# Patient Record
Sex: Male | Born: 1955
Health system: Southern US, Community
[De-identification: ages and names within clinical notes are randomized; demographics above are authoritative.]

## PROBLEM LIST (undated history)

## (undated) DIAGNOSIS — F411 Generalized anxiety disorder: Secondary | ICD-10-CM

## (undated) DIAGNOSIS — Z9289 Personal history of other medical treatment: Secondary | ICD-10-CM

## (undated) DIAGNOSIS — N200 Calculus of kidney: Secondary | ICD-10-CM

## (undated) DIAGNOSIS — F419 Anxiety disorder, unspecified: Secondary | ICD-10-CM

## (undated) DIAGNOSIS — I10 Essential (primary) hypertension: Secondary | ICD-10-CM

## (undated) DIAGNOSIS — L989 Disorder of the skin and subcutaneous tissue, unspecified: Secondary | ICD-10-CM

## (undated) DIAGNOSIS — I639 Cerebral infarction, unspecified: Secondary | ICD-10-CM

## (undated) HISTORY — DX: Disorder of the skin and subcutaneous tissue, unspecified: L98.9

## (undated) HISTORY — DX: Calculus of kidney: N20.0

## (undated) HISTORY — DX: Generalized anxiety disorder: F41.1

## (undated) HISTORY — DX: Cerebral infarction, unspecified: I63.9

## (undated) HISTORY — DX: Essential (primary) hypertension: I10

## (undated) HISTORY — PX: KIDNEY STONE SURGERY: SHX686

## (undated) HISTORY — DX: Personal history of other medical treatment: Z92.89

---

## 2001-08-28 ENCOUNTER — Emergency Department (HOSPITAL_COMMUNITY): Admission: EM | Admit: 2001-08-28 | Discharge: 2001-08-28 | Payer: Self-pay | Admitting: Emergency Medicine

## 2001-08-28 ENCOUNTER — Encounter: Payer: Self-pay | Admitting: Emergency Medicine

## 2005-06-12 DIAGNOSIS — Z9289 Personal history of other medical treatment: Secondary | ICD-10-CM

## 2005-06-12 HISTORY — DX: Personal history of other medical treatment: Z92.89

## 2005-06-12 HISTORY — PX: OTHER SURGICAL HISTORY: SHX169

## 2008-05-25 ENCOUNTER — Encounter: Payer: Self-pay | Admitting: Family Medicine

## 2008-05-25 LAB — CONVERTED CEMR LAB
ALT: 28 units/L
AST: 27 units/L
Albumin: 4.5 g/dL
Alkaline Phosphatase: 53 units/L
Anion Gap: 12.7
BUN: 17 mg/dL
CO2: 30 meq/L
Calcium: 9.6 mg/dL
Chloride: 101 meq/L
Cholesterol: 141 mg/dL
Creatinine, Ser: 1 mg/dL
Direct LDL: 78 mg/dL
Glucose, Bld: 84 mg/dL
HDL: 54 mg/dL
PSA: 2.23 ng/mL
Potassium: 4.7 meq/L
Sodium: 139 meq/L
Total Bilirubin: 1.1 mg/dL
Total CHOL/HDL Ratio: 2.61
Total Protein: 7 g/dL
Triglycerides: 50 mg/dL

## 2008-09-05 ENCOUNTER — Encounter: Payer: Self-pay | Admitting: Family Medicine

## 2008-09-05 LAB — HM COLONOSCOPY: HM Colonoscopy: NORMAL

## 2010-02-14 ENCOUNTER — Ambulatory Visit: Payer: Self-pay | Admitting: Family Medicine

## 2010-02-14 DIAGNOSIS — N200 Calculus of kidney: Secondary | ICD-10-CM | POA: Insufficient documentation

## 2010-02-14 DIAGNOSIS — L989 Disorder of the skin and subcutaneous tissue, unspecified: Secondary | ICD-10-CM | POA: Insufficient documentation

## 2010-02-14 HISTORY — DX: Calculus of kidney: N20.0

## 2010-02-27 ENCOUNTER — Encounter: Payer: Self-pay | Admitting: Family Medicine

## 2010-03-28 ENCOUNTER — Ambulatory Visit: Payer: Self-pay | Admitting: Family Medicine

## 2010-06-11 NOTE — Miscellaneous (Signed)
  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 Katrinka Blazing.  (see below) Feb. 2007:  2D Echo with normal LV function and no LVH.  Normal 2D Echo with EF 60 to 70%. Feb. 2007:  Stress Test done, normal myocardial perfusion without evidence of infarction of ischemia. Given these results and the patient's persistence of flipped T waves, I think these are likely asymptomatic and clinically insignficant findings. SKIN LESION (ICD-709.9) RENAL CALCULUS (ICD-592.0)   (02/27/2010 11:13) Added new observation of COLONNXTDUE: 09/2018 (02/27/2010 11:13) Added new observation of COLONOSCOPY: normal (09/05/2008 11:14) Added new observation of PSA: 2.23 ng/mL (05/25/2008 11:13) Added new observation of CALCIUM: 9.6 mg/dL (91/47/8295 62:13) Added new observation of ALBUMIN: 4.5 g/dL (08/65/7846 96:29) Added new observation of PROTEIN, TOT: 7.0 g/dL (52/84/1324 40:10) Added new observation of SGPT (ALT): 28 units/L (05/25/2008 11:13) Added new observation of SGOT (AST): 27 units/L (05/25/2008 11:13) Added new observation of ALK PHOS: 53 units/L (05/25/2008 11:13) Added new observation of BILI TOTAL: 1.1 mg/dL (27/25/3664 40:34) Added new observation of CREATININE: 1.00 mg/dL (74/25/9563 87:56) Added new observation of BUN: 17 mg/dL (43/32/9518 84:16) Added new observation of BG RANDOM: 84 mg/dL (60/63/0160 10:93) Added new observation of ANION GAP: 12.7  (05/25/2008 11:13) Added new observation of CO2 PLSM/SER: 30 meq/L (05/25/2008 11:13) Added new observation of CL SERUM: 101 meq/L (05/25/2008 11:13) Added new observation of K SERUM: 4.7 meq/L (05/25/2008 11:13) Added new observation of NA: 139 meq/L (05/25/2008 11:13) Added new observation of LDL DIR: 78 mg/dL (23/55/7322 02:54) Added new observation of CHOL/HDL: 2.61  (05/25/2008 11:13) Added new observation of HDL: 54 mg/dL (27/10/2374 28:31) Added new observation of  TRIGLYC TOT: 50 mg/dL (51/76/1607 37:10) Added new observation of CHOLESTEROL: 141 mg/dL (62/69/4854 62:70)      Preventive Care Screening  Colonoscopy:    Date:  09/05/2008    Next Due:  09/2018    Results:  normal    Past History:  Past Medical History: 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 Katrinka Blazing.  (see below) Feb. 2007:  2D Echo with normal LV function and no LVH.  Normal 2D Echo with EF 60 to 70%. Feb. 2007:  Stress Test done, normal myocardial perfusion without evidence of infarction of ischemia. Given these results and the patient's persistence of flipped T waves, I think these are likely asymptomatic and clinically insignficant findings. SKIN LESION (ICD-709.9) RENAL CALCULUS (ICD-592.0)

## 2010-06-11 NOTE — Procedures (Signed)
Summary: Colonoscopy/[facility]  Colonoscopy/[facility]   Imported By: Lanelle Bal 03/06/2010 11:30:30  _____________________________________________________________________  External Attachment:    Type:   Image     Comment:   External Document

## 2010-06-11 NOTE — Letter (Signed)
Summary: Deboraha Sprang @ [facility] @ [employer]   Imported By: Lanelle Bal 03/06/2010 11:32:25  _____________________________________________________________________  External Attachment:    Type:   Image     Comment:   External Document

## 2010-06-11 NOTE — Assessment & Plan Note (Signed)
Summary: TRANSFER FROM [facility]/HEP B SHOT/CLE   Vital Signs:  Patient profile:   55 year old male Height:      73 inches Weight:      186.25 pounds BMI:     24.66 Temp:     98.4 degrees F oral Pulse rate:   64 / minute Pulse rhythm:   regular BP sitting:   122 / 76  (left arm) Cuff size:   regular  Vitals Entered By: Delilah Shan CMA Duncan Dull) (February 14, 2010 8:47 AM) CC: Transfer from [place] - Hepatitis B Injection (1st)   History of Present Illness: New patient to clinic.  Needs HBV vaccine.  Will be giving flu shots at work.  Needs flu shot.   Mole R side for 30+years, needs eval.  Also with new spots on R arm.  No h/o skin CA.  distant h/o sunburn, but not since childhood.   Preventive Screening-Counseling & Management  Alcohol-Tobacco     Smoking Status: never  Caffeine-Diet-Exercise     Does Patient Exercise: yes      Drug Use:  no.    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 Katrinka Blazing    Past Surgical History: colonoscopy  ~2010, 10 year follow up  Family History: Reviewed history and no changes required. Family History of Prostate CA 1st degree relative <50, grandparent F alive, HTN-mild M alive,arrhythmia PGF with prostate CA  Social History: Reviewed history and no changes required. 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  Otherwise negative.    Physical Exam  General:  GEN: nad, alert and oriented HEENT: mucous membranes moist NECK: supple w/o LA CV: rrr.  no murmur PULM: ctab, no inc wob ABD: soft, +bs EXT: no edema SKIN: no acute rash but several lesions noted: R thorax at  midaxillary line- 7x41mm flat brown macule. R upper arm with 2 lesions- 1 is 6mm and the other is 7mm across.  Minimally raised, sligtly hyperpigmentated.  3mm and a 4mm fleshy papule w/o telectangasia or ulceration on back.    Impression & Recommendations:  Problem # 1:  SKIN LESION (ICD-709.9) All appear benign.  I would observe.  If the macule/mole on thorax changes, I would consider punch bx.  It hasn't change in years per patient.  i would observe the lesions on back.  They don't appear to be BCC.  He may have early stage of SK but this is not in need of intervention now.  Vaccines done today. Requesting records.   Other Orders: Hepatitis B Vaccine >67yrs [id number]) Admin 1st Vaccine (09811) Admin 1st Vaccine (91478) Flu Vaccine 34yrs + [phone])  Patient Instructions: 1)  I would get a physical in the spring or summer of 2012.  We can check your sugar and PSA at that point.  Get a morning appointment so you can come in fasting.   2)  I would come back in 1 month and then 6 months for the hepatitis vaccine.  You can plan the physical in about 7-8 months to match up with the last HBV shot.  3)  We'll get your records in the meantime.  4)  Take care.   Prior Medications (reviewed today): None Current Allergies (reviewed today): No known allergies    Immunizations Administered:  Hepatitis B Vaccine # 1:    Vaccine Type: HepB Adult    Site: left deltoid    Mfr: Merck    Dose: 1.0 ml    Route: IM    Given by: Delilah Shan CMA (AAMA)    Exp. Date: 08/08/2011    Lot #: [id number]    VIS given: 11/26/05 version given February 14, 2010.     Flu Vaccine Consent Questions     Do you have a history of severe allergic reactions to this vaccine? no    Any prior history of allergic reactions to egg and/or gelatin? no    Do you have a sensitivity to the preservative Thimersol? no    Do you have a past history of Guillan-Barre Syndrome? no    Do you currently have an acute febrile illness?  no    Have you ever had a severe reaction to latex? no    Vaccine information given and explained to patient? yes    Are you currently pregnant? no    Lot Number:[id number]   Exp Date:11/09/2010   Site Given  Left Deltoid IMlu

## 2010-06-11 NOTE — Assessment & Plan Note (Signed)
Summary: 2nd hepatitis shot/[facility]  Nurse Visit   Allergies: No Known Drug Allergies  Immunizations Administered:  Hepatitis B Vaccine # 2:    Vaccine Type: HepB Adult    Site: right deltoid    Mfr: Merck    Dose: 1.0 ml    Route: IM    Given by: Delilah Shan CMA (AAMA)    Exp. Date: 08/08/2011    Lot #: [id number]    VIS given: 11/26/05 version given March 28, 2010.  Orders Added: 1)  Hepatitis B Vaccine >63yrs [90746] 2)  Admin 1st Vaccine [phone]

## 2010-07-18 ENCOUNTER — Ambulatory Visit (HOSPITAL_COMMUNITY)
Admission: RE | Admit: 2010-07-18 | Discharge: 2010-07-18 | Disposition: A | Discharge status: Home / Self Care | Payer: Managed Care, Other (non HMO) | Source: Ambulatory Visit | Attending: Urology | Admitting: Urology

## 2010-07-18 DIAGNOSIS — N200 Calculus of kidney: Secondary | ICD-10-CM | POA: Insufficient documentation

## 2010-07-18 IMAGING — CR DG ABDOMEN 1V
1 series · 1 of 1 positions shown · non-contrast
Comparison: None.

CLINICAL DATA: Right ureteral calculus.

ABDOMEN - 1 VIEW

[t abdomen supine]
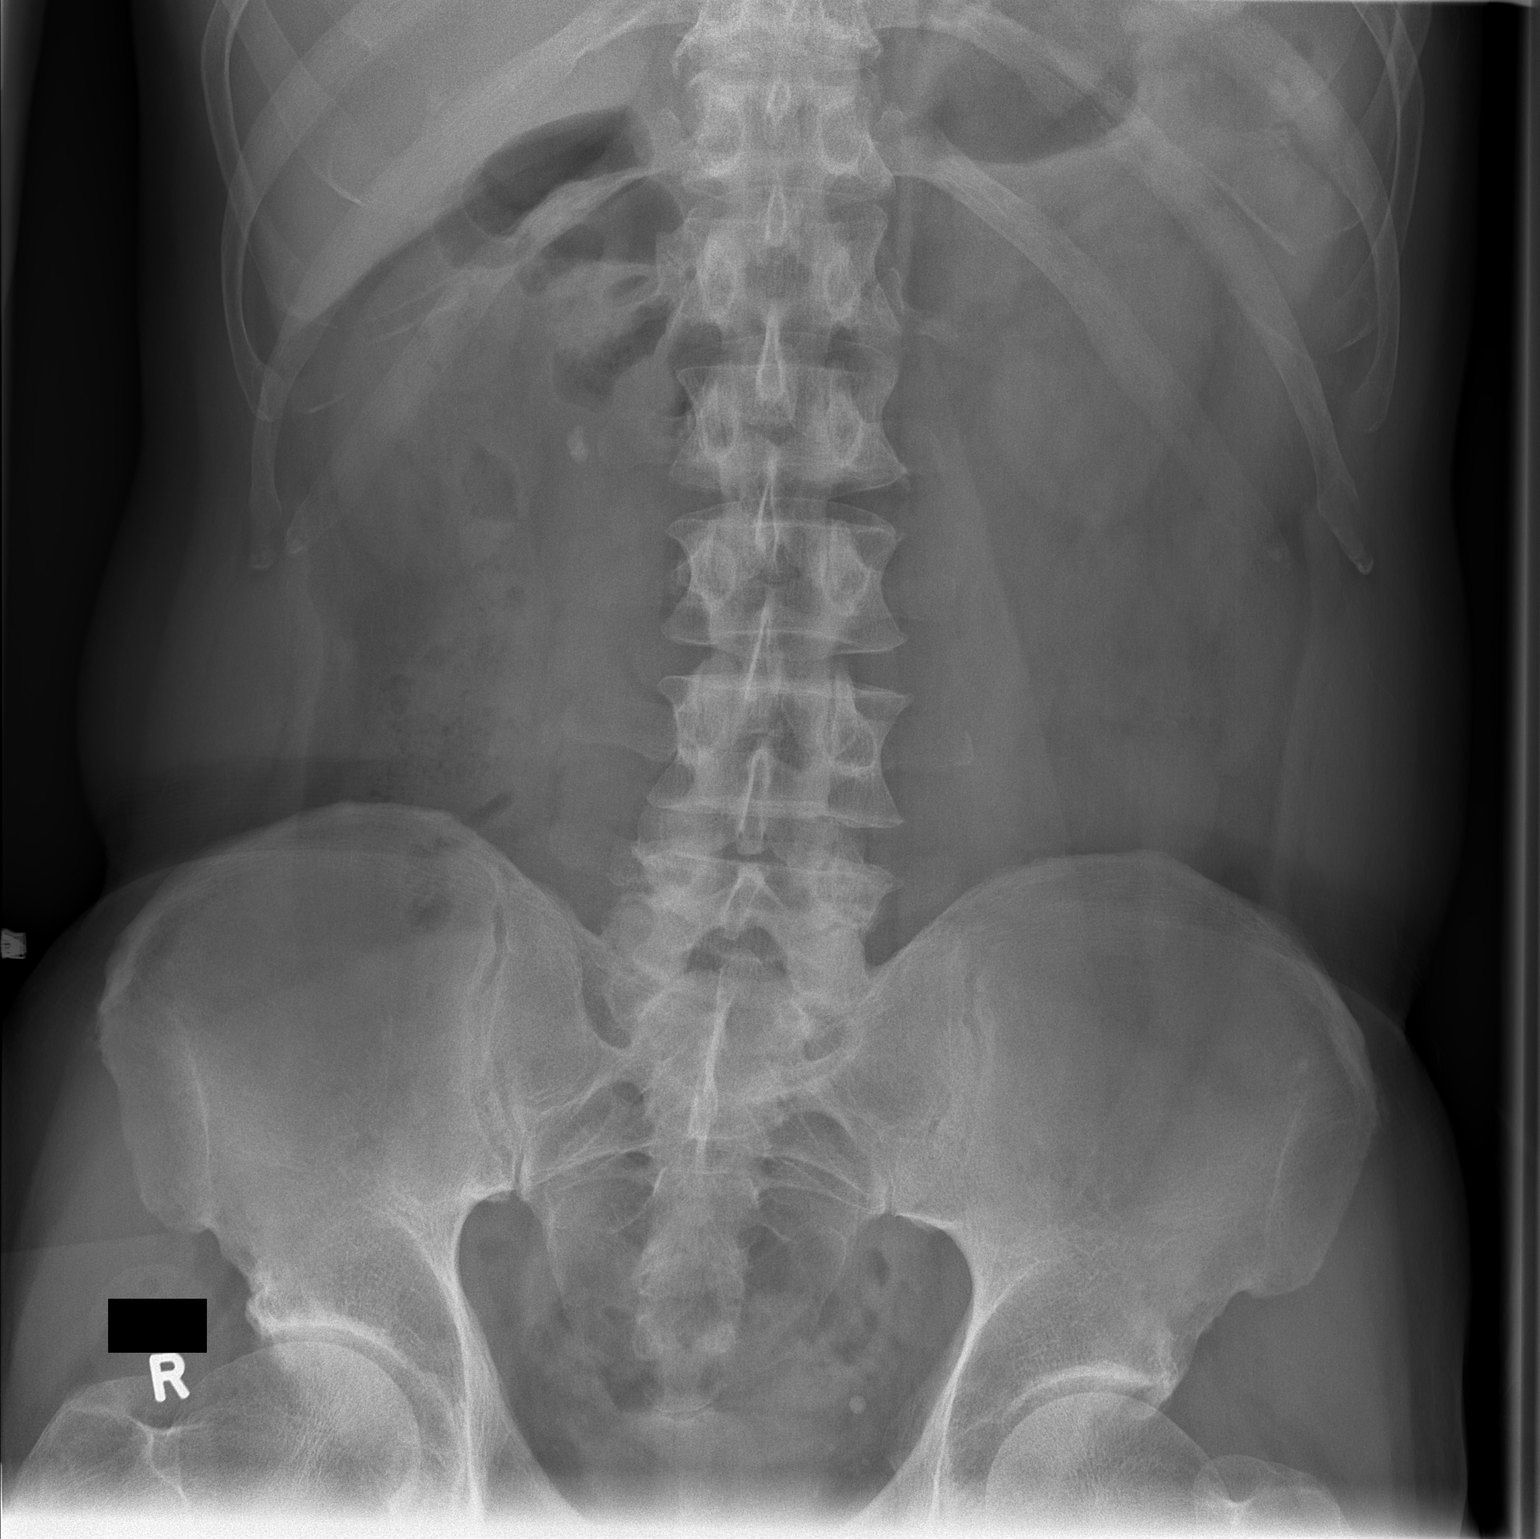

[1 of 1 positions shown; findings below may reference images not displayed]

FINDINGS: A radiopaque calculus is seen overlying expected location
of the right renal pelvis measuring 6 x 10 mm.  No other radiopaque
urinary calculi are identified.  Phlebolith noted in the left
pelvis.  The bowel gas pattern is normal.
IMPRESSION: 6 x 10 mm calculus in expected region of right renal pelvis.

## 2010-07-19 ENCOUNTER — Emergency Department (HOSPITAL_BASED_OUTPATIENT_CLINIC_OR_DEPARTMENT_OTHER)
Admission: EM | Admit: 2010-07-19 | Discharge: 2010-07-19 | Disposition: A | Discharge status: Home / Self Care | Payer: Managed Care, Other (non HMO) | Attending: Emergency Medicine | Admitting: Emergency Medicine

## 2010-07-19 ENCOUNTER — Emergency Department (INDEPENDENT_AMBULATORY_CARE_PROVIDER_SITE_OTHER): Payer: Managed Care, Other (non HMO)

## 2010-07-19 DIAGNOSIS — Z87442 Personal history of urinary calculi: Secondary | ICD-10-CM

## 2010-07-19 DIAGNOSIS — R109 Unspecified abdominal pain: Secondary | ICD-10-CM

## 2010-07-19 DIAGNOSIS — N200 Calculus of kidney: Secondary | ICD-10-CM | POA: Insufficient documentation

## 2010-07-19 LAB — CBC
Platelets: 219 10*3/uL (ref 150–400)
RDW: 12.3 % (ref 11.5–15.5)
WBC: 16.8 10*3/uL — ABNORMAL HIGH (ref 4.0–10.5)

## 2010-07-19 LAB — URINALYSIS, ROUTINE W REFLEX MICROSCOPIC
Leukocytes, UA: NEGATIVE
Nitrite: NEGATIVE
Specific Gravity, Urine: 1.027 (ref 1.005–1.030)
Urobilinogen, UA: 0.2 mg/dL (ref 0.0–1.0)

## 2010-07-19 LAB — BASIC METABOLIC PANEL
CO2: 20 mEq/L (ref 19–32)
Calcium: 9.3 mg/dL (ref 8.4–10.5)
Creatinine, Ser: 1.4 mg/dL (ref 0.4–1.5)
Glucose, Bld: 142 mg/dL — ABNORMAL HIGH (ref 70–99)

## 2010-07-19 LAB — DIFFERENTIAL
Basophils Absolute: 0 10*3/uL (ref 0.0–0.1)
Eosinophils Absolute: 0 10*3/uL (ref 0.0–0.7)
Eosinophils Relative: 0 % (ref 0–5)
Lymphocytes Relative: 4 % — ABNORMAL LOW (ref 12–46)
Neutrophils Relative %: 89 % — ABNORMAL HIGH (ref 43–77)

## 2010-07-19 LAB — URINE MICROSCOPIC-ADD ON

## 2010-07-19 IMAGING — CR DG ABDOMEN 1V
1 series · 1 of 1 positions shown · non-contrast
Comparison: [DATE]

CLINICAL DATA: Flank pain. Recent lithotripsy.

ABDOMEN - 1 VIEW

[t abdomen supine]
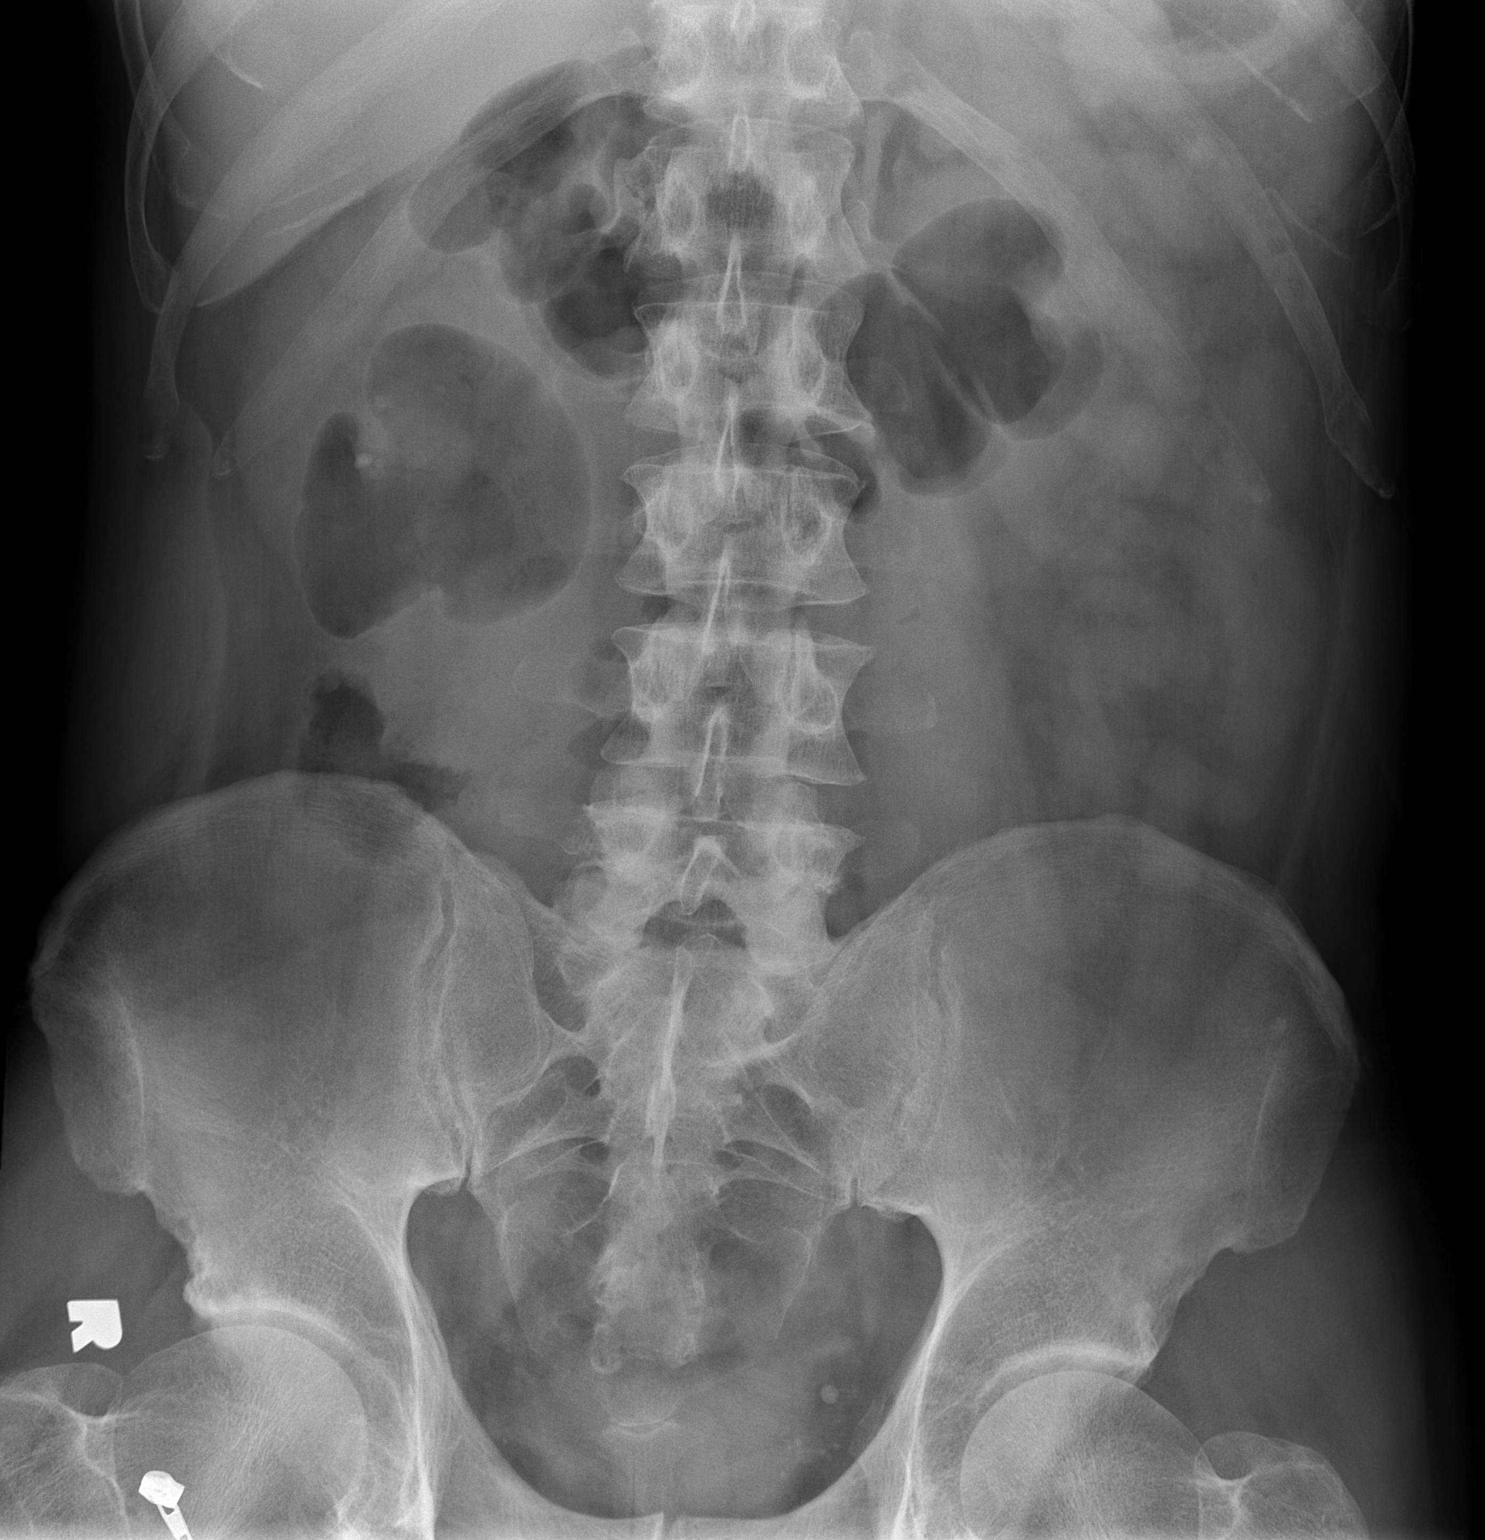

[1 of 1 positions shown; findings below may reference images not displayed]

FINDINGS: Small calcifications project over the mid and lower pole
of the right kidney.  No visible stones over the left kidney or
expected course of the ureters.  Previously seen 10 x 6 mm stone in
the region of the right renal pelvis no longer visualized.  There
is a normal bowel gas pattern.
IMPRESSION: The previously seen 10 mm stone no longer visualized.  Small right
renal calculi seen.

## 2010-07-20 LAB — URINE CULTURE: Culture  Setup Time: 201203090835

## 2010-09-14 ENCOUNTER — Encounter: Payer: Self-pay | Admitting: Family Medicine

## 2010-09-24 ENCOUNTER — Other Ambulatory Visit: Payer: Self-pay

## 2010-10-01 ENCOUNTER — Encounter: Payer: Self-pay | Admitting: Family Medicine

## 2011-01-13 ENCOUNTER — Other Ambulatory Visit: Payer: Self-pay | Admitting: Family Medicine

## 2011-01-13 DIAGNOSIS — Z131 Encounter for screening for diabetes mellitus: Secondary | ICD-10-CM

## 2011-01-13 DIAGNOSIS — Z8042 Family history of malignant neoplasm of prostate: Secondary | ICD-10-CM

## 2011-01-13 DIAGNOSIS — Z13220 Encounter for screening for lipoid disorders: Secondary | ICD-10-CM

## 2011-01-20 ENCOUNTER — Other Ambulatory Visit (INDEPENDENT_AMBULATORY_CARE_PROVIDER_SITE_OTHER): Payer: Managed Care, Other (non HMO)

## 2011-01-20 DIAGNOSIS — Z8042 Family history of malignant neoplasm of prostate: Secondary | ICD-10-CM

## 2011-01-20 DIAGNOSIS — Z13220 Encounter for screening for lipoid disorders: Secondary | ICD-10-CM

## 2011-01-20 DIAGNOSIS — Z131 Encounter for screening for diabetes mellitus: Secondary | ICD-10-CM

## 2011-01-20 LAB — LIPID PANEL
HDL: 53.4 mg/dL (ref >39.00)
Triglycerides: 42 mg/dL (ref 0.0–149.0)

## 2011-01-20 LAB — PSA: PSA: 2.41 ng/mL (ref 0.10–4.00)

## 2011-01-24 ENCOUNTER — Encounter: Payer: Self-pay | Admitting: Family Medicine

## 2011-01-28 ENCOUNTER — Encounter: Payer: Self-pay | Admitting: Family Medicine

## 2011-03-04 ENCOUNTER — Encounter: Payer: Self-pay | Admitting: Family Medicine

## 2011-03-04 ENCOUNTER — Ambulatory Visit (INDEPENDENT_AMBULATORY_CARE_PROVIDER_SITE_OTHER): Payer: Managed Care, Other (non HMO) | Admitting: Family Medicine

## 2011-03-04 VITALS — BP 116/80 | HR 59 | Temp 98.0°F | Wt 182.1 lb

## 2011-03-04 DIAGNOSIS — Z23 Encounter for immunization: Secondary | ICD-10-CM

## 2011-03-04 DIAGNOSIS — Z0000 Encounter for general adult medical examination without abnormal findings: Secondary | ICD-10-CM

## 2011-03-04 NOTE — Patient Instructions (Signed)
Take care. Keep exercising.  I would get a flu shot each fall.   I would get a physical in about 2 years.  Let me know if you have concerns in the meantime.   Glad to see you.

## 2011-03-05 ENCOUNTER — Encounter: Payer: Self-pay | Admitting: Family Medicine

## 2011-03-05 DIAGNOSIS — Z0000 Encounter for general adult medical examination without abnormal findings: Secondary | ICD-10-CM | POA: Insufficient documentation

## 2011-03-05 NOTE — Progress Notes (Signed)
CPE- See plan.  Routine anticipatory guidance given to patient.  See health maintenance. Labs d/w pt.    PMH and SH reviewed  Meds, vitals, and allergies reviewed.   ROS: See HPI.  Otherwise negative.    GEN: nad, alert and oriented HEENT: mucous membranes moist NECK: supple w/o LA CV: rrr. PULM: ctab, no inc wob ABD: soft, +bs EXT: no edema SKIN: no acute rash.  several lesions noted: R thorax at midaxillary line- 7x33mm flat brown macule.  R upper arm with 2 lesions- 1 is 6mm and the other is 7mm across. Minimally raised, sligtly hyperpigmentated. 3mm and a 4mm fleshy papule w/o telectangasia or ulceration on back.  No change from prev exam. Prostate gland firm and smooth, no enlargement, nodularity, tenderness, mass, asymmetry or induration.

## 2011-03-05 NOTE — Assessment & Plan Note (Signed)
Flu and tdap today.  PSA wnl.  Prostate exam wnl.  Colonoscopy up to date.  Skin lesions unchanged.  Lipids and sugar wnl.  Doing well, exercising.  F/u prn.  He agrees.

## 2013-01-27 ENCOUNTER — Encounter: Payer: Self-pay | Admitting: Family Medicine

## 2013-01-27 ENCOUNTER — Ambulatory Visit (INDEPENDENT_AMBULATORY_CARE_PROVIDER_SITE_OTHER): Payer: BC Managed Care – PPO | Admitting: Family Medicine

## 2013-01-27 VITALS — BP 108/70 | HR 66 | Temp 97.6°F | Wt 181.2 lb

## 2013-01-27 DIAGNOSIS — L989 Disorder of the skin and subcutaneous tissue, unspecified: Secondary | ICD-10-CM

## 2013-01-27 NOTE — Progress Notes (Signed)
2 years ago he notes a faint red spot near the R lateral eyelid junction.  Then he had a pair of lesions on the L cheek that were similar. Bactroban would help a little but then it wouldn't fully resolve.    Meds, vitals, and allergies reviewed.   ROS: See HPI.  Otherwise, noncontributory.  nad Old lesions unchanged-several lesions noted: R thorax at midaxillary line- 7x42mm flat brown macule. R upper arm with 2 lesions- 1 is 6mm and the other is 7mm across. Minimally raised, sligtly hyperpigmentated. 3mm and a 4mm fleshy papule w/o telectangasia or ulceration on back.  New lesions: small 4mm macule near R temple.   4mm papular lesion on the L cheek, appears irritated.  4mm macular lesion on the L cheek, doesn't appear irritated.

## 2013-01-27 NOTE — Patient Instructions (Addendum)
Keep the area clear and covered if needed.  If it doesn't fully heal over, then call me or send me a message.

## 2013-01-28 NOTE — Assessment & Plan Note (Signed)
Old lesions unchanged, reassured.  3 new lesions- reassured about two of the three 87mm-sized macules.  The other 4mm papule could be irritated from shaving on L cheek. D/w pt about options, ie obs, referral, liq N2.  Treated x3 with liq N2 and this should resolve.  If not he'll notify me.

## 2016-02-07 ENCOUNTER — Ambulatory Visit (INDEPENDENT_AMBULATORY_CARE_PROVIDER_SITE_OTHER): Payer: 59 | Admitting: Family Medicine

## 2016-02-07 ENCOUNTER — Encounter: Payer: Self-pay | Admitting: Family Medicine

## 2016-02-07 VITALS — BP 112/84 | HR 76 | Temp 98.4°F | Ht 73.0 in | Wt 172.0 lb

## 2016-02-07 DIAGNOSIS — L989 Disorder of the skin and subcutaneous tissue, unspecified: Secondary | ICD-10-CM

## 2016-02-07 DIAGNOSIS — R002 Palpitations: Secondary | ICD-10-CM | POA: Diagnosis not present

## 2016-02-07 DIAGNOSIS — F411 Generalized anxiety disorder: Secondary | ICD-10-CM

## 2016-02-07 DIAGNOSIS — R634 Abnormal weight loss: Secondary | ICD-10-CM | POA: Diagnosis not present

## 2016-02-07 LAB — CBC WITH DIFFERENTIAL/PLATELET
BASOS ABS: 0 10*3/uL (ref 0.0–0.1)
Basophils Relative: 0.3 % (ref 0.0–3.0)
EOS PCT: 0.1 % (ref 0.0–5.0)
Eosinophils Absolute: 0 10*3/uL (ref 0.0–0.7)
HCT: 47.4 % (ref 39.0–52.0)
HEMOGLOBIN: 16.3 g/dL (ref 13.0–17.0)
Lymphocytes Relative: 10.7 % — ABNORMAL LOW (ref 12.0–46.0)
Lymphs Abs: 1 10*3/uL (ref 0.7–4.0)
MCHC: 34.3 g/dL (ref 30.0–36.0)
MCV: 90.1 fl (ref 78.0–100.0)
MONO ABS: 0.6 10*3/uL (ref 0.1–1.0)
MONOS PCT: 6.3 % (ref 3.0–12.0)
Neutro Abs: 7.5 10*3/uL (ref 1.4–7.7)
Neutrophils Relative %: 82.6 % — ABNORMAL HIGH (ref 43.0–77.0)
Platelets: 219 10*3/uL (ref 150.0–400.0)
RBC: 5.26 Mil/uL (ref 4.22–5.81)
RDW: 13.4 % (ref 11.5–15.5)
WBC: 9.1 10*3/uL (ref 4.0–10.5)

## 2016-02-07 LAB — COMPREHENSIVE METABOLIC PANEL
ALBUMIN: 4.3 g/dL (ref 3.5–5.2)
ALK PHOS: 45 U/L (ref 39–117)
ALT: 17 U/L (ref 0–53)
AST: 19 U/L (ref 0–37)
BILIRUBIN TOTAL: 1 mg/dL (ref 0.2–1.2)
BUN: 16 mg/dL (ref 6–23)
CO2: 32 mEq/L (ref 19–32)
CREATININE: 0.95 mg/dL (ref 0.40–1.50)
Calcium: 9.9 mg/dL (ref 8.4–10.5)
Chloride: 104 mEq/L (ref 96–112)
GFR: 86.05 mL/min (ref >60.00)
GLUCOSE: 89 mg/dL (ref 70–99)
POTASSIUM: 4.6 meq/L (ref 3.5–5.1)
SODIUM: 142 meq/L (ref 135–145)
TOTAL PROTEIN: 7.2 g/dL (ref 6.0–8.3)

## 2016-02-07 LAB — TSH: TSH: 1.32 u[IU]/mL (ref 0.35–4.50)

## 2016-02-07 MED ORDER — ESCITALOPRAM OXALATE 10 MG PO TABS: 10.0000 mg | ORAL_TABLET | Freq: Every day | ORAL | 1 refills | Status: DC

## 2016-02-07 NOTE — Progress Notes (Signed)
Red patch on the R side of face, using hydrocortisone prn.  Lesion has resolved in the meantime.    Presumed anxiety.  Father is ill, job is tough with retail work, and his son is in college at [employer].  Weight loss in the last few months, he attributed that to stress and upheaval.  Sleep is disrupted.  No SI/HI.    [place]    He has not had typical chest pain but occ heart pounding, "like you're watching the last 5 seconds of a close game."  This sensation lasts longer than expected, he has trouble relaxing.  Sx can happen at rest.  No exertional sx.  Exercising can help his sx, help him feel better.    PMH and SH reviewed  ROS: Per HPI unless specifically indicated in ROS section   Meds, vitals, and allergies reviewed.   GEN: nad, alert and oriented HEENT: mucous membranes moist NECK: supple w/o LA CV: rrr.  no murmur PULM: ctab, no inc wob ABD: soft, +bs EXT: no edema SKIN: no acute rash  EKG reviewed with patient.

## 2016-02-07 NOTE — Assessment & Plan Note (Signed)
He does not have visible skin lesion on the right side of face. The previous redness has apparently resolved with as needed hydrocortisone use. This is likely benign simple irritation. Discussed with patient. Continue topical hydrocortisone and follow-up as needed. He agrees.

## 2016-02-07 NOTE — Progress Notes (Signed)
Pre visit review using our clinic review tool, if applicable. No additional management support is needed unless otherwise documented below in the visit note. 

## 2016-02-07 NOTE — Patient Instructions (Addendum)
Go to the lab on the way out.  We'll contact you with your lab report. Take care.  Glad to see you.  

## 2016-02-07 NOTE — Assessment & Plan Note (Signed)
Check routine labs. See notes on labs. Likely that he does not have an ominous process, but the multiple stressors in his life have led to the weight loss. He agrees.

## 2016-02-07 NOTE — Assessment & Plan Note (Signed)
He has multiple stressors noted. No suicidal or homicidal intent. Still okay for outpatient follow-up. I think that his complaint of heart pounding is likely related anxiety and not related to an underlying cardiac issue. He has a known history of flipped T waves on previous EKGs reportedly. This was noted in previous records, but I do not have the old EKGs in front of me for comparison. His EKG today does not show any acute changes. Discussed with patient about options, including counseling and/or medication. He opted to start Lexapro 10 mg a day. This is reasonable. Routine indications and timeline for effect discussed with patient. We can update me about how he is doing. He agrees with plan.>30 minutes spent in face to face time with patient, >50% spent in counselling or coordination of care.

## 2016-10-24 MED FILL — ESCITALOPRAM 10 MG TABLET: 10 | 90 days supply | Qty: 90 | Fill #0

## 2017-10-06 ENCOUNTER — Encounter: Payer: Self-pay | Admitting: Family Medicine

## 2017-10-06 ENCOUNTER — Ambulatory Visit (INDEPENDENT_AMBULATORY_CARE_PROVIDER_SITE_OTHER): Payer: 59 | Admitting: Family Medicine

## 2017-10-06 VITALS — BP 132/80 | HR 56 | Temp 98.2°F | Ht 74.0 in | Wt 193.5 lb

## 2017-10-06 DIAGNOSIS — Z7189 Other specified counseling: Secondary | ICD-10-CM

## 2017-10-06 DIAGNOSIS — Z13220 Encounter for screening for lipoid disorders: Secondary | ICD-10-CM | POA: Diagnosis not present

## 2017-10-06 DIAGNOSIS — M7660 Achilles tendinitis, unspecified leg: Secondary | ICD-10-CM | POA: Diagnosis not present

## 2017-10-06 DIAGNOSIS — Z0000 Encounter for general adult medical examination without abnormal findings: Secondary | ICD-10-CM

## 2017-10-06 DIAGNOSIS — Z131 Encounter for screening for diabetes mellitus: Secondary | ICD-10-CM

## 2017-10-06 DIAGNOSIS — Z125 Encounter for screening for malignant neoplasm of prostate: Secondary | ICD-10-CM | POA: Diagnosis not present

## 2017-10-06 DIAGNOSIS — F411 Generalized anxiety disorder: Secondary | ICD-10-CM

## 2017-10-06 LAB — LIPID PANEL
CHOL/HDL RATIO: 3
CHOLESTEROL: 148 mg/dL (ref 0–200)
HDL: 53.6 mg/dL (ref >39.00)
LDL Cholesterol: 84 mg/dL (ref 0–99)
NonHDL: 94.2
TRIGLYCERIDES: 49 mg/dL (ref 0.0–149.0)
VLDL: 9.8 mg/dL (ref 0.0–40.0)

## 2017-10-06 LAB — PSA: PSA: 2.93 ng/mL (ref 0.10–4.00)

## 2017-10-06 LAB — GLUCOSE, RANDOM: Glucose, Bld: 87 mg/dL (ref 70–99)

## 2017-10-06 NOTE — Patient Instructions (Addendum)
Go to the lab on the way out.  We'll contact you with your lab report. Try a heel lift in both shoes and gently stretch.  Gradually return to exercise.  Update me as needed.  Taper down to  of lexapro for about 1-2 months then stop if tolerated.   Take care.  Glad to see you.

## 2017-10-06 NOTE — Progress Notes (Signed)
His son is in school at [employer] and doing well.  His work situation is better.    He is still on lexapro.  "I really needed it when I started taking it and now I feel much better, I'm happier."  Unclear if from situation change or from med or from both.  No ADE on med.  He wanted to try to taper med.    His weight is back up, and likely closer to normal for him.    Due for lipid screening, sugar screening.  See notes on labs.   Prostate cancer screening and PSA options (with potential risks and benefits of testing vs not testing) were discussed along with recent recs/guidelines.  He opted for testing PSA at this point.   HIV and HCV likely prev done with life insurance screening, d/w pt.    He has calf pain.  Had been jogging but had to lay off due to apin.  Prev with achilles pain.  Better with resting and not jogging.  D/w pt about stretching.  No h/o DVT.  No FH DVT.  Had been running about 6-9 miles a week.    Wife designated if patient if patient were incapacitated.   Meds, vitals, and allergies reviewed.   ROS: Per HPI unless specifically indicated in ROS section   GEN: nad, alert and oriented HEENT: mucous membranes moist NECK: supple w/o LA CV: rrr.  PULM: ctab, no inc wob ABD: soft, +bs EXT: no edema SKIN: no acute rash R Achilles slightly ttp.  Normal R DP pulse.

## 2017-10-08 ENCOUNTER — Encounter: Payer: Self-pay | Admitting: *Deleted

## 2017-10-08 DIAGNOSIS — Z0000 Encounter for general adult medical examination without abnormal findings: Secondary | ICD-10-CM | POA: Insufficient documentation

## 2017-10-08 DIAGNOSIS — M7660 Achilles tendinitis, unspecified leg: Secondary | ICD-10-CM | POA: Insufficient documentation

## 2017-10-08 DIAGNOSIS — Z7189 Other specified counseling: Secondary | ICD-10-CM | POA: Insufficient documentation

## 2017-10-08 HISTORY — DX: Achilles tendinitis, unspecified leg: M76.60

## 2017-10-08 NOTE — Assessment & Plan Note (Signed)
Due for lipid screening, sugar screening.  See notes on labs.   Prostate cancer screening and PSA options (with potential risks and benefits of testing vs not testing) were discussed along with recent recs/guidelines.  He opted for testing PSA at this point.   HIV and HCV likely prev done with life insurance screening, d/w pt.

## 2017-10-08 NOTE — Assessment & Plan Note (Signed)
Wife designated if patient if patient were incapacitated.

## 2017-10-08 NOTE — Assessment & Plan Note (Signed)
With concurrent calf pain.  Low risk for DVT.  Discussed with patient about using bilateral heel lifts and gentle stretching.  Update me as needed.  I do not feel a band record on calf exam.

## 2017-10-08 NOTE — Assessment & Plan Note (Addendum)
Can taper down to  of lexapro for about 1-2 months then stop if tolerated.  He agrees.  >25 minutes spent in face to face time with patient, >50% spent in counselling or coordination of care.

## 2017-10-12 ENCOUNTER — Encounter: Payer: Self-pay | Admitting: *Deleted

## 2018-03-08 DIAGNOSIS — H524 Presbyopia: Secondary | ICD-10-CM | POA: Diagnosis not present

## 2019-03-07 ENCOUNTER — Other Ambulatory Visit: Payer: Self-pay

## 2019-03-07 ENCOUNTER — Encounter: Payer: Self-pay | Admitting: Family Medicine

## 2019-03-07 ENCOUNTER — Ambulatory Visit: Payer: 59 | Admitting: Family Medicine

## 2019-03-07 VITALS — BP 126/84 | HR 68 | Temp 97.1°F | Ht 74.0 in | Wt 193.5 lb

## 2019-03-07 DIAGNOSIS — F411 Generalized anxiety disorder: Secondary | ICD-10-CM

## 2019-03-07 DIAGNOSIS — Z23 Encounter for immunization: Secondary | ICD-10-CM

## 2019-03-07 MED ORDER — ESCITALOPRAM OXALATE 10 MG PO TABS: 5.0000 mg | ORAL_TABLET | Freq: Every day | ORAL | 1 refills | Status: DC

## 2019-03-07 NOTE — Patient Instructions (Addendum)
Thanks for getting a flu shot.  Take care.  Glad to see you.  Try cutting the lexapro back to 5mg  a day for about 2 months, if tolerated.  If needed, then go back to 10mg  a day.  If you tolerate the taper, then stop after 2 months.

## 2019-03-07 NOTE — Progress Notes (Signed)
Anxiety. Son and daughter both at [employer], they are doing well.  His father died in Jul 03, 2018.  D/w pt.  He thought SSRI prev helped.  We talked about continue vs taper.  No SI/HI.    His father had dementia/parkinsons and patient was asking about any interventions to prevent that.  We talked about diet/exercise/alcohol/routine cautions.  He ran 2 miles this AM.  He is trying to keep his mind active.    Flu shot today.    ROS: Per HPI unless specifically indicated in ROS section   Meds, vitals, and allergies reviewed.   GEN: nad, alert and oriented HEENT: ncat NECK: supple w/o LA CV: rrr. PULM: ctab, no inc wob ABD: soft, +bs EXT: no edema SKIN: no acute rash Affect and judgment appear normal.  No tremor.

## 2019-03-09 NOTE — Assessment & Plan Note (Signed)
We talked about a possible gradual taper of his SSRI.  Doing well and at this point it is reasonable to try to taper. He can begin by cutting the lexapro back to 5mg  a day for about 2 months, if tolerated.  If needed, then go back to 10mg  a day.  If tolerating the taper, then stop after 2 months. He agrees.  Update me as needed.  Recheck at a physical next year, sooner if needed.

## 2019-04-03 ENCOUNTER — Telehealth: Payer: 59 | Admitting: Family

## 2019-04-03 DIAGNOSIS — Z20828 Contact with and (suspected) exposure to other viral communicable diseases: Secondary | ICD-10-CM

## 2019-04-03 DIAGNOSIS — R438 Other disturbances of smell and taste: Secondary | ICD-10-CM | POA: Diagnosis not present

## 2019-04-03 DIAGNOSIS — R05 Cough: Secondary | ICD-10-CM | POA: Diagnosis not present

## 2019-04-03 DIAGNOSIS — Z20822 Contact with and (suspected) exposure to covid-19: Secondary | ICD-10-CM

## 2019-04-03 MED ORDER — BENZONATATE 100 MG PO CAPS: 100.0000 mg | ORAL_CAPSULE | Freq: Three times a day (TID) | ORAL | 0 refills | Status: DC | PRN

## 2019-04-03 NOTE — Progress Notes (Signed)
E-Visit for Corona Virus Screening   Your current symptoms could be consistent with the coronavirus.  Many health care providers can now test patients at their office but not all are.  [facility] has multiple testing sites. For information on our COVID testing locations and hours go to [email or web address]  Please quarantine yourself while awaiting your test results.  We are enrolling you in our MyChart Home [facility] for COVID19 . Daily you will receive a questionnaire within the MyChart website. Our COVID 19 response team willl be monitoriing your responses daily. Please continue good preventive care measures, including:  frequent hand-washing, avoid touching your face, cover coughs/sneezes, stay out of crowds and keep a 6 foot distance from others.  You can go to one of the testing sites listed below, while they are opened (see hours). You do not need an order and will stay in your car during the test. You do need to self isolate until your results return and if positive 10 days from when your symptoms started and until you are 3 days fever free.   Testing Locations (Monday - Friday, 10 a.m. - 3 p.m.) . [place]: [facility], [address], [place], [place]  . [place]: [facility], [address], [place], [place] (entrance [address])  . [place]: (Closed each Monday): Testing site relocated to the short stay covered drive at [facility]. (Use the [address] entrance to  Hospital next to [facility].    COVID-19 is a respiratory illness with symptoms that are similar to the flu. Symptoms are typically mild to moderate, but there have been cases of severe illness and death due to the virus. The following symptoms may appear 2-14 days after exposure: . Fever . Cough . Shortness of breath or difficulty breathing . Chills . Repeated shaking with chills . Muscle  pain . Headache . Sore throat . New loss of taste or smell . Fatigue . Congestion or runny nose . Nausea or vomiting . Diarrhea  If you develop fever/cough/breathlessness, please stay home for 10 days with improving symptoms and until you have had 24 hours of no fever (without taking a fever reducer).  Go to the nearest hospital ED for assessment if fever/cough/breathlessness are severe or illness seems like a threat to life.  It is vitally important that if you feel that you have an infection such as this virus or any other virus that you stay home and away from places where you may spread it to others.  You should avoid contact with people age 65 and older.   You should wear a mask or cloth face covering over your nose and mouth if you must be around other people or animals, including pets (even at home). Try to stay at least 6 feet away from other people. This will protect the people around you.  You can use medication such as A prescription cough medication called Tessalon Perles 100 mg. You may take 1-2 capsules every 8 hours as needed for cough.  You may also take acetaminophen (Tylenol) as needed for fever.   Reduce your risk of any infection by using the same precautions used for avoiding the common cold or flu:  . Wash your hands often with soap and warm water for at least 20 seconds.  If soap and water are not readily available, use an alcohol-based hand sanitizer with at least 60% alcohol.  . If coughing or sneezing,   cover your mouth and nose by coughing or sneezing into the elbow areas of your shirt or coat, into a tissue or into your sleeve (not your hands). . Avoid shaking hands with others and consider head nods or verbal greetings only. . Avoid touching your eyes, nose, or mouth with unwashed hands.  . Avoid close contact with people who are sick. . Avoid places or events with large numbers of people in one location, like concerts or sporting events. . Carefully consider  travel plans you have or are making. . If you are planning any travel outside or inside the [place], visit the CDC's Travelers' Health webpage for the latest health notices. . If you have some symptoms but not all symptoms, continue to monitor at home and seek medical attention if your symptoms worsen. . If you are having a medical emergency, call 911.  HOME CARE . Only take medications as instructed by your medical team. . Drink plenty of fluids and get plenty of rest. . A steam or ultrasonic humidifier can help if you have congestion.   GET HELP RIGHT AWAY IF YOU HAVE EMERGENCY WARNING SIGNS** FOR COVID-19. If you or someone is showing any of these signs seek emergency medical care immediately. Call 911 or proceed to your closest emergency facility if: . You develop worsening high fever. . Trouble breathing . Bluish lips or face . Persistent pain or pressure in the chest . New confusion . Inability to wake or stay awake . You cough up blood. . Your symptoms become more severe  **This list is not all possible symptoms. Contact your medical provider for any symptoms that are sever or concerning to you.   MAKE SURE YOU   Understand these instructions.  Will watch your condition.  Will get help right away if you are not doing well or get worse.  Your e-visit answers were reviewed by a board certified advanced clinical practitioner to complete your personal care plan.  Depending on the condition, your plan could have included both over the counter or prescription medications.  If there is a problem please reply once you have received a response from your provider.  Your safety is important to us.  If you have drug allergies check your prescription carefully.    You can use MyChart to ask questions about today's visit, request a non-urgent call back, or ask for a work or school excuse for 24 hours related to this e-Visit. If it has been greater than 24 hours you will need to follow up with  your provider, or enter a new e-Visit to address those concerns. You will get an e-mail in the next two days asking about your experience.  I hope that your e-visit has been valuable and will speed your recovery. Thank you for using e-visits.  Approximately 5 minutes was spent documenting and reviewing patient's chart.    

## 2020-01-11 DIAGNOSIS — I639 Cerebral infarction, unspecified: Secondary | ICD-10-CM | POA: Insufficient documentation

## 2020-02-04 DIAGNOSIS — I639 Cerebral infarction, unspecified: Secondary | ICD-10-CM

## 2020-02-04 HISTORY — DX: Cerebral infarction, unspecified: I63.9

## 2020-02-05 ENCOUNTER — Other Ambulatory Visit: Payer: Self-pay

## 2020-02-05 ENCOUNTER — Inpatient Hospital Stay (HOSPITAL_BASED_OUTPATIENT_CLINIC_OR_DEPARTMENT_OTHER)
Admission: EM | Admit: 2020-02-05 | Discharge: 2020-02-07 | DRG: 042 | Disposition: A | Discharge status: Home / Self Care | Payer: 59 | Attending: Family Medicine | Admitting: Family Medicine

## 2020-02-05 ENCOUNTER — Encounter (HOSPITAL_BASED_OUTPATIENT_CLINIC_OR_DEPARTMENT_OTHER): Payer: Self-pay | Admitting: Emergency Medicine

## 2020-02-05 ENCOUNTER — Emergency Department (HOSPITAL_COMMUNITY): Payer: 59

## 2020-02-05 ENCOUNTER — Emergency Department (HOSPITAL_BASED_OUTPATIENT_CLINIC_OR_DEPARTMENT_OTHER): Payer: 59

## 2020-02-05 DIAGNOSIS — I6349 Cerebral infarction due to embolism of other cerebral artery: Secondary | ICD-10-CM | POA: Diagnosis present

## 2020-02-05 DIAGNOSIS — I6389 Other cerebral infarction: Secondary | ICD-10-CM | POA: Diagnosis not present

## 2020-02-05 DIAGNOSIS — I6782 Cerebral ischemia: Secondary | ICD-10-CM | POA: Diagnosis not present

## 2020-02-05 DIAGNOSIS — Q7649 Other congenital malformations of spine, not associated with scoliosis: Secondary | ICD-10-CM | POA: Diagnosis not present

## 2020-02-05 DIAGNOSIS — H5461 Unqualified visual loss, right eye, normal vision left eye: Secondary | ICD-10-CM | POA: Diagnosis present

## 2020-02-05 DIAGNOSIS — F419 Anxiety disorder, unspecified: Secondary | ICD-10-CM | POA: Diagnosis not present

## 2020-02-05 DIAGNOSIS — R202 Paresthesia of skin: Secondary | ICD-10-CM | POA: Diagnosis present

## 2020-02-05 DIAGNOSIS — H53461 Homonymous bilateral field defects, right side: Secondary | ICD-10-CM | POA: Diagnosis present

## 2020-02-05 DIAGNOSIS — Z20822 Contact with and (suspected) exposure to covid-19: Secondary | ICD-10-CM | POA: Diagnosis present

## 2020-02-05 DIAGNOSIS — F411 Generalized anxiety disorder: Secondary | ICD-10-CM | POA: Diagnosis not present

## 2020-02-05 DIAGNOSIS — R29701 NIHSS score 1: Secondary | ICD-10-CM | POA: Diagnosis present

## 2020-02-05 DIAGNOSIS — I639 Cerebral infarction, unspecified: Secondary | ICD-10-CM | POA: Diagnosis present

## 2020-02-05 DIAGNOSIS — Z79899 Other long term (current) drug therapy: Secondary | ICD-10-CM | POA: Diagnosis not present

## 2020-02-05 DIAGNOSIS — M4319 Spondylolisthesis, multiple sites in spine: Secondary | ICD-10-CM | POA: Diagnosis not present

## 2020-02-05 DIAGNOSIS — R413 Other amnesia: Secondary | ICD-10-CM | POA: Diagnosis not present

## 2020-02-05 DIAGNOSIS — Z7982 Long term (current) use of aspirin: Secondary | ICD-10-CM

## 2020-02-05 DIAGNOSIS — G459 Transient cerebral ischemic attack, unspecified: Secondary | ICD-10-CM | POA: Diagnosis not present

## 2020-02-05 DIAGNOSIS — R29818 Other symptoms and signs involving the nervous system: Secondary | ICD-10-CM

## 2020-02-05 DIAGNOSIS — Z8249 Family history of ischemic heart disease and other diseases of the circulatory system: Secondary | ICD-10-CM

## 2020-02-05 DIAGNOSIS — E785 Hyperlipidemia, unspecified: Secondary | ICD-10-CM | POA: Diagnosis present

## 2020-02-05 DIAGNOSIS — R42 Dizziness and giddiness: Secondary | ICD-10-CM | POA: Diagnosis present

## 2020-02-05 DIAGNOSIS — M7660 Achilles tendinitis, unspecified leg: Secondary | ICD-10-CM | POA: Diagnosis not present

## 2020-02-05 DIAGNOSIS — I081 Rheumatic disorders of both mitral and tricuspid valves: Secondary | ICD-10-CM | POA: Diagnosis not present

## 2020-02-05 DIAGNOSIS — M47812 Spondylosis without myelopathy or radiculopathy, cervical region: Secondary | ICD-10-CM | POA: Diagnosis not present

## 2020-02-05 DIAGNOSIS — R9431 Abnormal electrocardiogram [ECG] [EKG]: Secondary | ICD-10-CM | POA: Diagnosis not present

## 2020-02-05 DIAGNOSIS — H538 Other visual disturbances: Secondary | ICD-10-CM | POA: Diagnosis not present

## 2020-02-05 DIAGNOSIS — I6622 Occlusion and stenosis of left posterior cerebral artery: Secondary | ICD-10-CM | POA: Diagnosis not present

## 2020-02-05 DIAGNOSIS — R220 Localized swelling, mass and lump, head: Secondary | ICD-10-CM | POA: Diagnosis not present

## 2020-02-05 HISTORY — DX: Anxiety disorder, unspecified: F41.9

## 2020-02-05 LAB — CBC
HCT: 46 % (ref 39.0–52.0)
Hemoglobin: 15.7 g/dL (ref 13.0–17.0)
MCH: 30.7 pg (ref 26.0–34.0)
MCHC: 34.1 g/dL (ref 30.0–36.0)
MCV: 90 fL (ref 80.0–100.0)
Platelets: 209 10*3/uL (ref 150–400)
RBC: 5.11 MIL/uL (ref 4.22–5.81)
RDW: 12.6 % (ref 11.5–15.5)
WBC: 8.6 10*3/uL (ref 4.0–10.5)
nRBC: 0 % (ref 0.0–0.2)

## 2020-02-05 LAB — BASIC METABOLIC PANEL
Anion gap: 10 (ref 5–15)
BUN: 21 mg/dL (ref 8–23)
CO2: 27 mmol/L (ref 22–32)
Calcium: 9.2 mg/dL (ref 8.9–10.3)
Chloride: 101 mmol/L (ref 98–111)
Creatinine, Ser: 1.06 mg/dL (ref 0.61–1.24)
GFR calc Af Amer: >60 mL/min (ref >60)
GFR calc non Af Amer: >60 mL/min (ref >60)
Glucose, Bld: 171 mg/dL — ABNORMAL HIGH (ref 70–99)
Potassium: 3.8 mmol/L (ref 3.5–5.1)
Sodium: 138 mmol/L (ref 135–145)

## 2020-02-05 LAB — URINALYSIS, ROUTINE W REFLEX MICROSCOPIC
Bilirubin Urine: NEGATIVE
Glucose, UA: NEGATIVE mg/dL
Hgb urine dipstick: NEGATIVE
Ketones, ur: NEGATIVE mg/dL
Leukocytes,Ua: NEGATIVE
Nitrite: NEGATIVE
Protein, ur: NEGATIVE mg/dL
Specific Gravity, Urine: 1.02 (ref 1.005–1.030)
pH: 6 (ref 5.0–8.0)

## 2020-02-05 LAB — RAPID URINE DRUG SCREEN, HOSP PERFORMED
Amphetamines: NOT DETECTED
Barbiturates: NOT DETECTED
Benzodiazepines: NOT DETECTED
Cocaine: NOT DETECTED
Opiates: NOT DETECTED
Tetrahydrocannabinol: NOT DETECTED

## 2020-02-05 LAB — RESPIRATORY PANEL BY RT PCR (FLU A&B, COVID)
Influenza A by PCR: NEGATIVE
Influenza B by PCR: NEGATIVE
SARS Coronavirus 2 by RT PCR: NEGATIVE

## 2020-02-05 LAB — PROTIME-INR
INR: 1.1 (ref 0.8–1.2)
Prothrombin Time: 13.4 seconds (ref 11.4–15.2)

## 2020-02-05 LAB — DIFFERENTIAL
Abs Immature Granulocytes: 0.04 10*3/uL (ref 0.00–0.07)
Basophils Absolute: 0 10*3/uL (ref 0.0–0.1)
Basophils Relative: 1 %
Eosinophils Absolute: 0.1 10*3/uL (ref 0.0–0.5)
Eosinophils Relative: 1 %
Immature Granulocytes: 1 %
Lymphocytes Relative: 13 %
Lymphs Abs: 1.2 10*3/uL (ref 0.7–4.0)
Monocytes Absolute: 0.6 10*3/uL (ref 0.1–1.0)
Monocytes Relative: 7 %
Neutro Abs: 6.6 10*3/uL (ref 1.7–7.7)
Neutrophils Relative %: 77 %

## 2020-02-05 LAB — ETHANOL: Alcohol, Ethyl (B): <10 mg/dL (ref <10)

## 2020-02-05 LAB — CBG MONITORING, ED: Glucose-Capillary: 189 mg/dL — ABNORMAL HIGH (ref 70–99)

## 2020-02-05 LAB — APTT: aPTT: 31 seconds (ref 24–36)

## 2020-02-05 IMAGING — MR MR CERVICAL SPINE W/O CM
4 of 5 series · 18 of 48 positions shown · non-contrast
Comparison: [DATE] CTA head and neck.

CLINICAL DATA: Blurry vision, neuro deficit

EXAM:
MRI HEAD WITHOUT CONTRAST
MRI CERVICAL SPINE WITHOUT CONTRAST
TECHNIQUE: Multiplanar, multiecho pulse sequences of the brain and surrounding
structures, and cervical spine, to include the craniocervical
junction and cervicothoracic junction, were obtained without
intravenous contrast.

[Series 11: T2 · sagittal · 3.0mm · 0.43mm/px · 5 of 16 slices shown (1 of 2)]
[im 1/16]
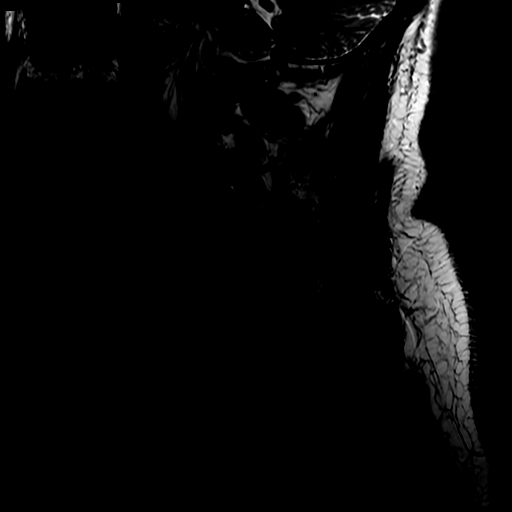
[im 4/16]
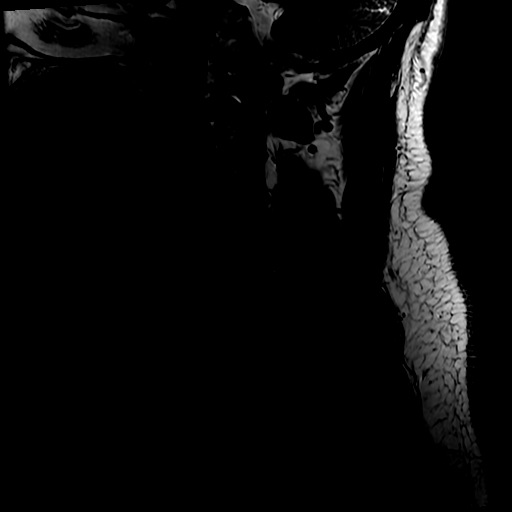
[im 8/16]
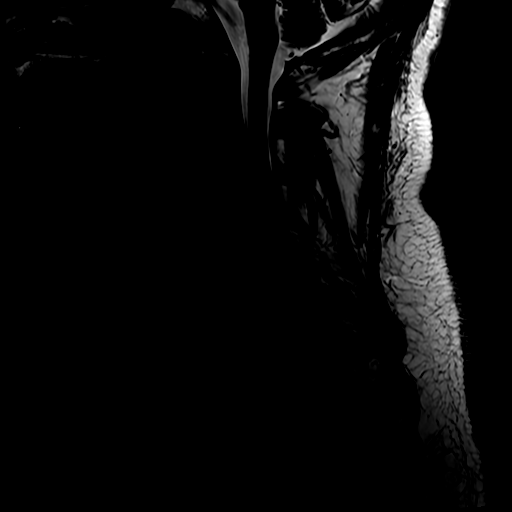
[im 12/16]
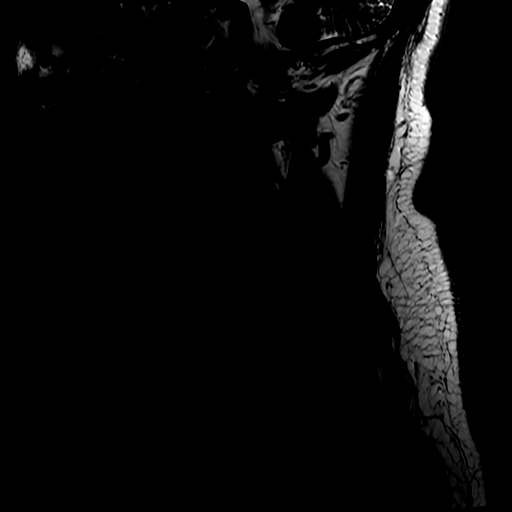
[im 16/16]
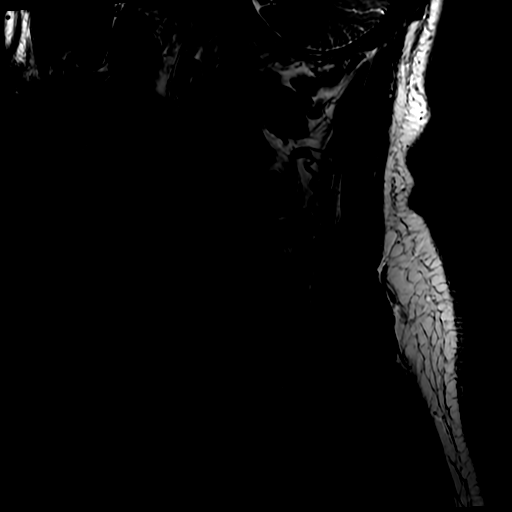

[Series 12: FLAIR · sagittal · 3.0mm · 0.43mm/px · 3 of 16 slices shown]
[im 1/16]
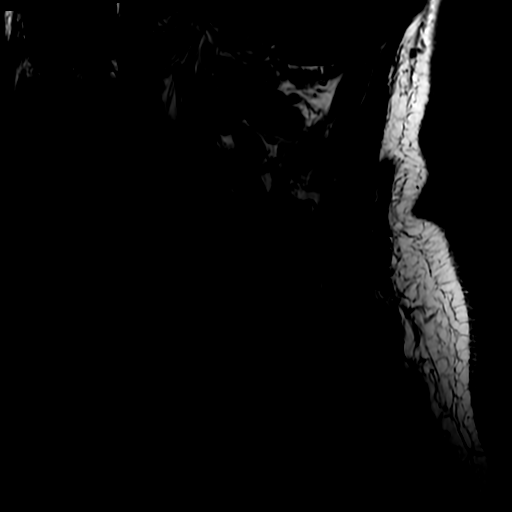
[im 11/16]
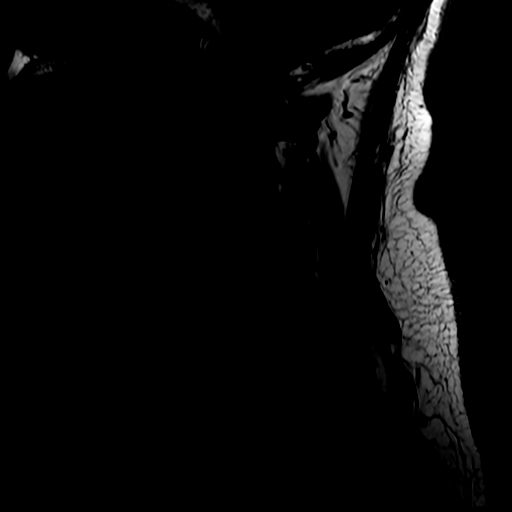
[im 16/16]
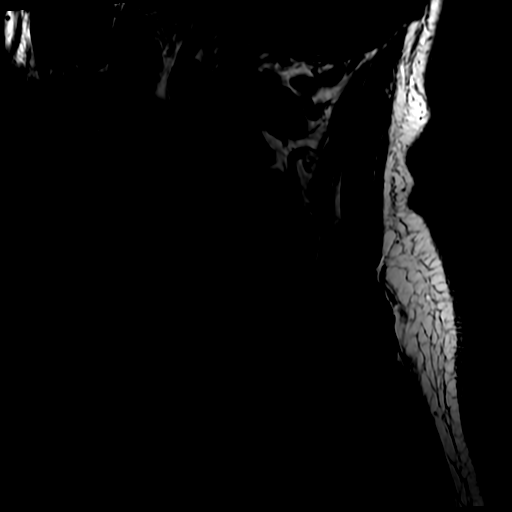

[Series 13: STIR · sagittal · 3.0mm · 0.43mm/px · 3 of 16 slices shown]
[im 1/16]
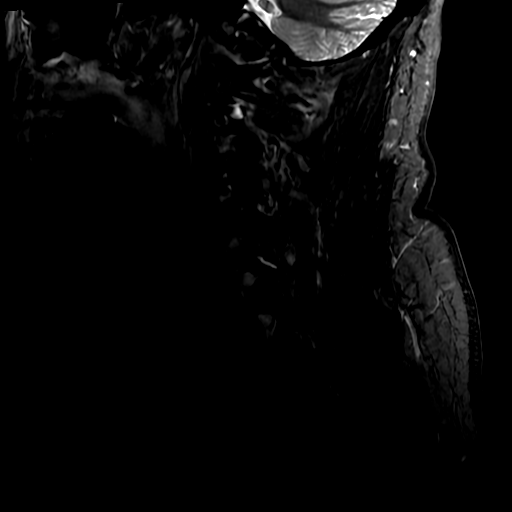
[im 11/16]
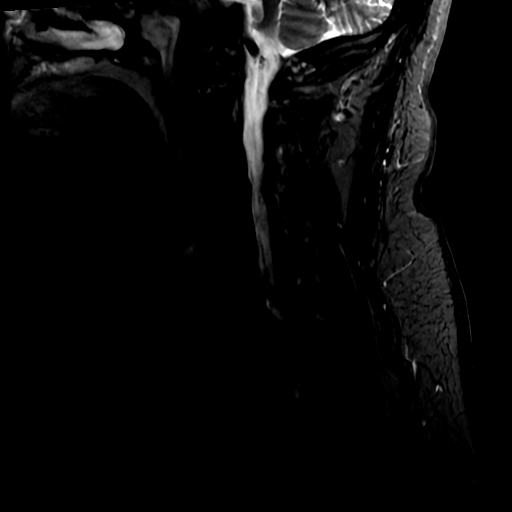
[im 16/16]
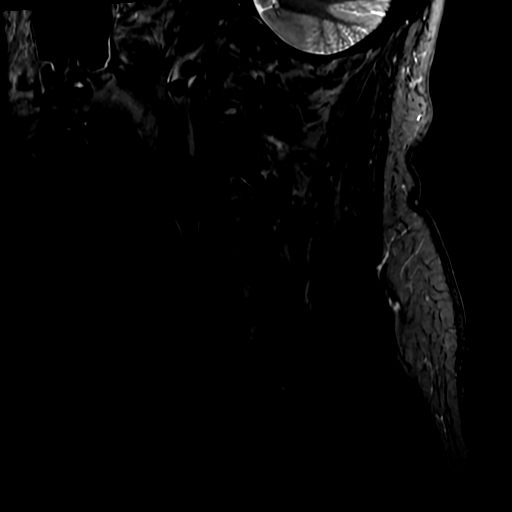

[Series 15: T2 · axial · 3.0mm · 0.35mm/px · z∈[-249,-161]mm · 7 of 33 slices shown (2 of 2)]
[im 1/33]
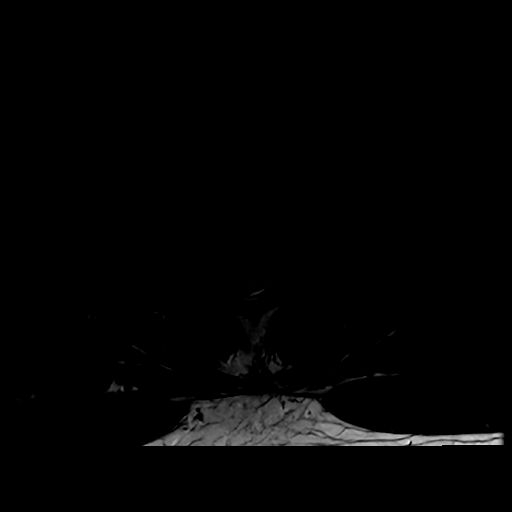
[im 5/33]
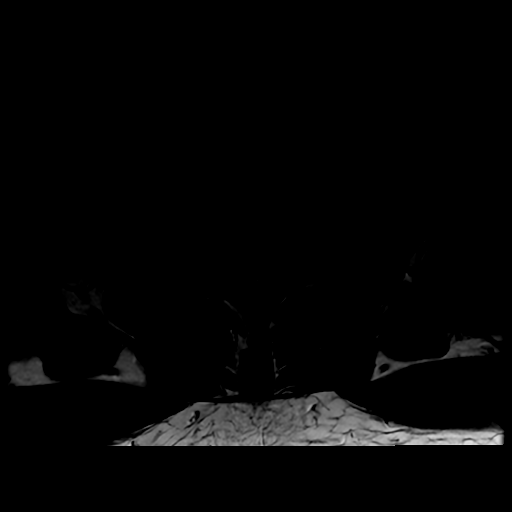
[im 9/33]
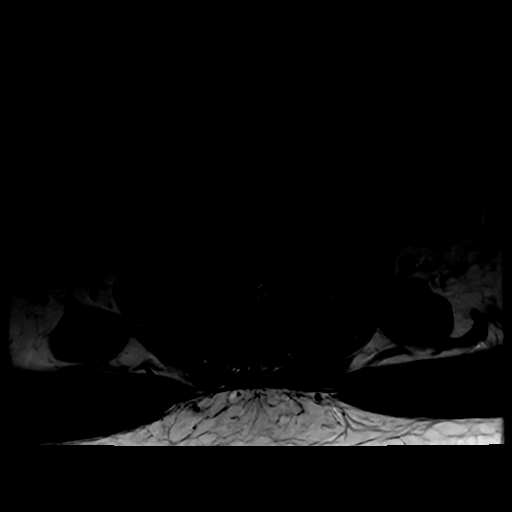
[im 13/33]
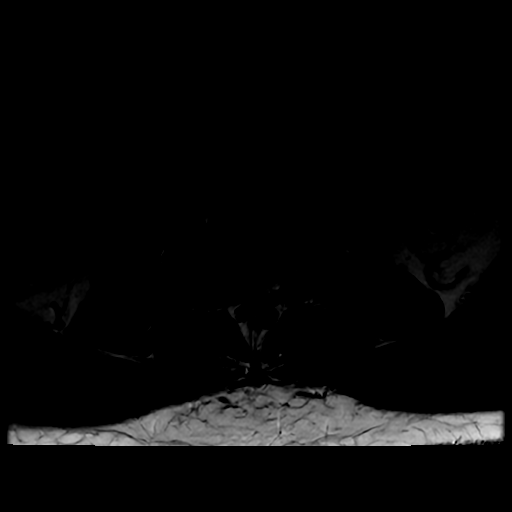
[im 17/33]
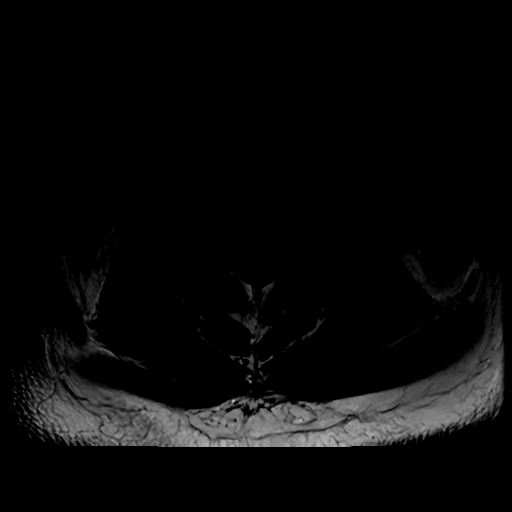
[im 21/33]
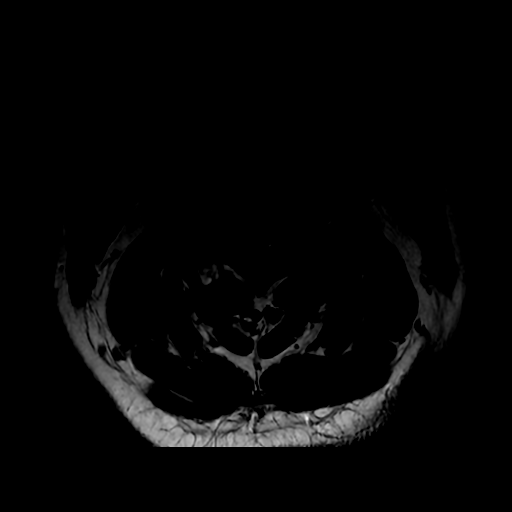
[im 29/33]
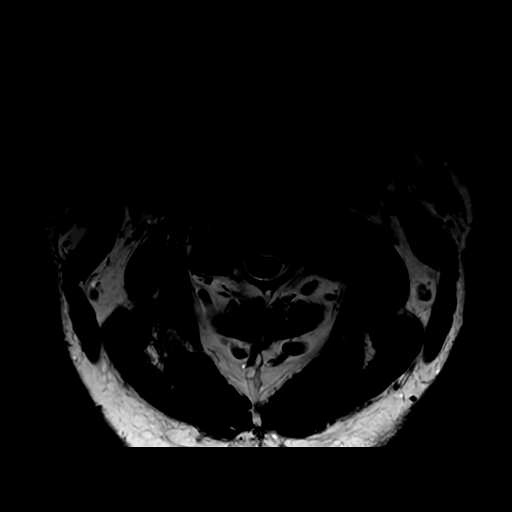

[18 of 48 positions shown; findings below may reference images not displayed]

FINDINGS: MRI HEAD FINDINGS

Brain: Acute infarct involving the left hippocampal tail. No
intracranial hemorrhage. Cerebral volume is within normal limits.
Minimal chronic microvascular ischemic changes. No midline shift,
ventriculomegaly or extra-axial fluid collection. No mass lesion.

Vascular: Please see same day CTA head and neck.

Skull and upper cervical spine: Normal marrow signal.

Sinuses/Orbits: Normal orbits. Clear paranasal sinuses. No mastoid
effusion.

Other: None.

MRI CERVICAL SPINE FINDINGS

Alignment: Straightening of lordosis. Minimal grade 1 C6-7 and C7-T1
anterolisthesis.

Vertebrae: Normal bone marrow signal intensity. No focal osseous
lesion. Congenital spinal canal narrowing.

Cord: Normal signal and morphology.

Posterior Fossa, vertebral arteries: Please see MRI head.

Disc levels: Multilevel desiccation and mild disc space loss.

C2-3: Bilateral uncovertebral and facet hypertrophy. Patent spinal
canal and neural foramen.

C3-4: Central protrusion abutting the ventral cord with
uncovertebral and facet hypertrophy. Mild spinal canal and left
neural foraminal narrowing. Patent right neural foramen.

C4-5: Disc osteophyte complex with superimposed central protrusion,
uncovertebral and bilateral facet hypertrophy. Mild spinal canal and
bilateral neural foraminal narrowing.

C5-6: Small central protrusion. Uncovertebral and bilateral facet
hypertrophy. Mild spinal canal and moderate bilateral neural
foraminal narrowing.

C6-7: Disc osteophyte complex with superimposed right foraminal
protrusion. Right predominant uncovertebral and facet hypertrophy.
Mild spinal canal, moderate right and mild left neural foraminal
narrowing.

C7-T1: No significant disc bulge. Uncovertebral and facet
hypertrophy. Patent spinal canal and right neural foramen. Mild left
neural foraminal narrowing.

Paraspinal tissues: No acute finding.
IMPRESSION: MRI HEAD:

Acute left hippocampal tail infarct. Minimal chronic microvascular
ischemic changes.

These results were called by telephone at the time of interpretation
on [DATE] at [DATE] to provider Dr. HENRY, Who verbally
acknowledged these results.

MRI CERVICAL SPINE:

Congenital spinal canal narrowing with superimposed spondylosis.

No acute finding within the cervical spine.

Mild C4-7 spinal canal narrowing.

Moderate bilateral C5-6 and right C6-7 neural foraminal narrowing.

## 2020-02-05 IMAGING — CT CT ANGIO HEAD
1 of 10 series · 6 of 33 positions shown · IV contrast (omnipaque)
Comparison: Concurrent head CT.

CLINICAL DATA: Transient ischemic attack. Left-sided arm weakness
and blurry vision/dizziness.

EXAM:
CT ANGIOGRAPHY HEAD AND NECK
TECHNIQUE: Multidetector CT imaging of the head and neck was performed using
the standard protocol during bolus administration of intravenous
contrast. Multiplanar CT image reconstructions and MIPs were
obtained to evaluate the vascular anatomy. Carotid stenosis
measurements (when applicable) are obtained utilizing NASCET
criteria, using the distal internal carotid diameter as the
denominator.
CONTRAST:  100mL OMNIPAQUE IOHEXOL 350 MG/ML SOLN

[Series 510: axial thin · axial · 0.43mm/px · z∈[-392,-112]mm · 6 of 395 slices shown]
[im 57/395  soft-tissue]
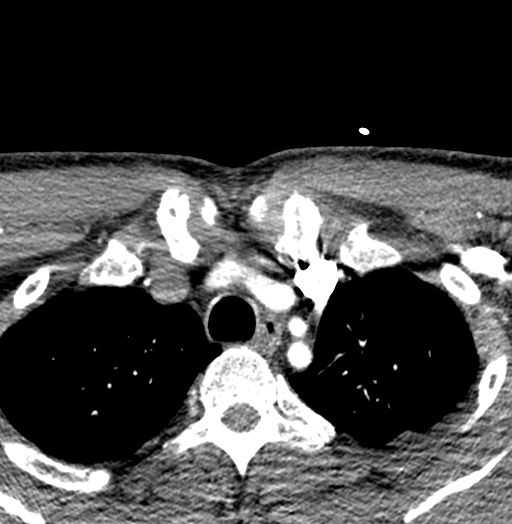
[im 113/395  bone]
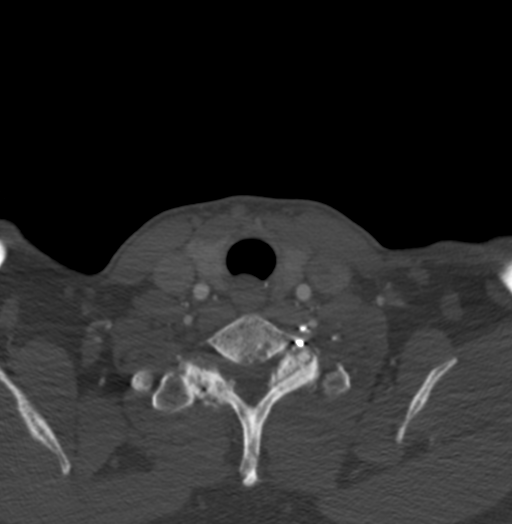
[im 169/395  soft-tissue]
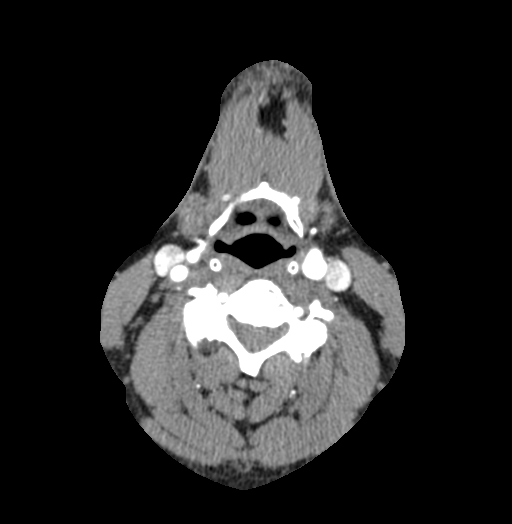
[im 226/395  bone]
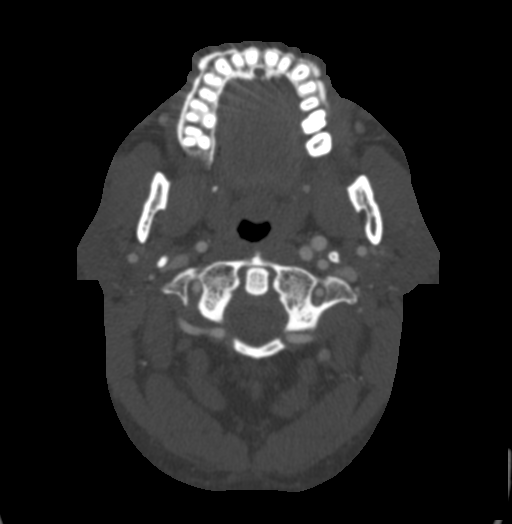
[im 282/395  soft-tissue]
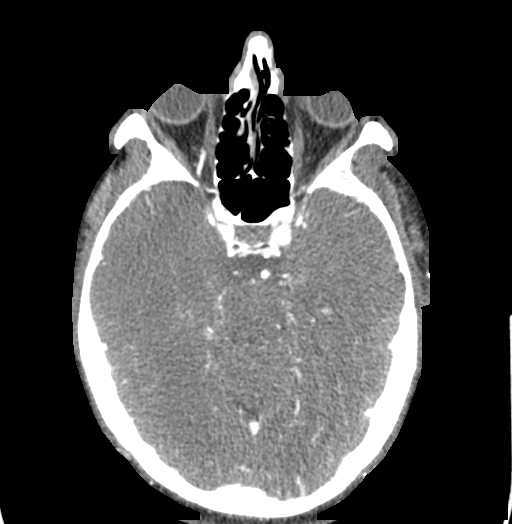
[im 338/395  bone]
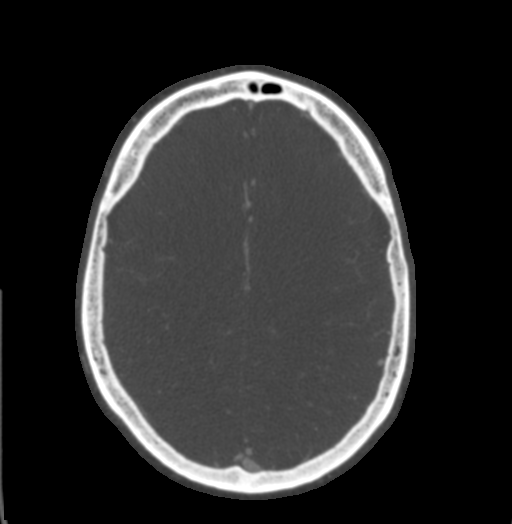

[6 of 33 positions shown; findings below may reference images not displayed]

FINDINGS: CT HEAD FINDINGS

Brain: No evidence of acute large vascular territory infarction,
hemorrhage, hydrocephalus, extra-axial collection or mass
lesion/mass effect.

Skull: Normal. Negative for fracture or focal lesion.

Sinuses: Right maxillary sinus retention cyst. Otherwise, imaged
portions are clear.

Orbits: No acute finding.

Review of the MIP images confirms the above findings

CTA NECK FINDINGS

Aortic arch: Imaged portion shows no evidence of aneurysm or
dissection. No significant stenosis of the major arch vessel
origins.

Right carotid system: No evidence of dissection, stenosis (50% or
greater) or occlusion.

Left carotid system: No evidence of dissection, stenosis (50% or
greater) or occlusion.

Vertebral arteries: Mild multifocal narrowing of the right vertebral
artery secondary to mass effect from adjacent bony osteophytes. No
evidence of significant (greater than 50%) arterial stenosis or
occlusion.

Skeleton: Multilevel degenerative change with degenerative
anterolisthesis of C6 on C7.

Other neck: No acute abnormality.

Upper chest: No acute abnormality.

Review of the MIP images confirms the above findings

CTA HEAD FINDINGS

Anterior circulation: No significant stenosis, proximal occlusion,
aneurysm, or vascular malformation.

Posterior circulation: No significant stenosis, proximal occlusion,
aneurysm, or vascular malformation. Mild left PCA narrowing.

Venous sinuses: As permitted by contrast timing, patent.

Review of the MIP images confirms the above findings
IMPRESSION: 1. No evidence of acute intracranial abnormality.
2. No significant (greater than 50%) arterial stenosis or occlusion
in the head or neck.

## 2020-02-05 IMAGING — MR MR HEAD W/O CM
6 of 10 series · 27 of 48 positions shown · non-contrast
Comparison: [DATE] CTA head and neck.

CLINICAL DATA: Blurry vision, neuro deficit

EXAM:
MRI HEAD WITHOUT CONTRAST
MRI CERVICAL SPINE WITHOUT CONTRAST
TECHNIQUE: Multiplanar, multiecho pulse sequences of the brain and surrounding
structures, and cervical spine, to include the craniocervical
junction and cervicothoracic junction, were obtained without
intravenous contrast.

[Series 2: DWI · axial · 3.0mm · 0.94mm/px · z∈[-77,+76]mm · 8 of 104 slices shown (1 of 2)]
[im 1/104]
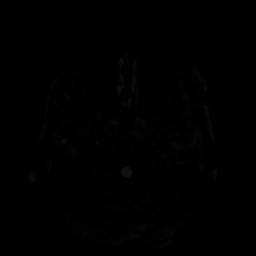
[im 12/104]
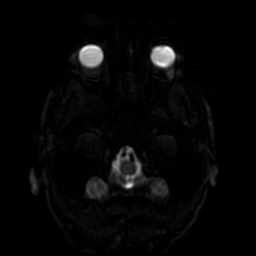
[im 35/104]
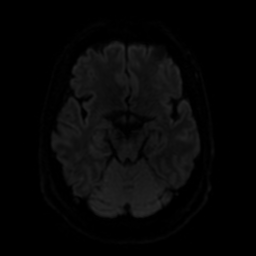
[im 46/104]
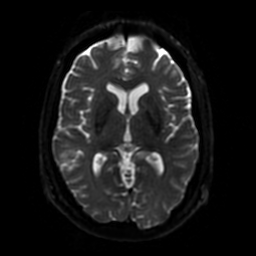
[im 58/104]
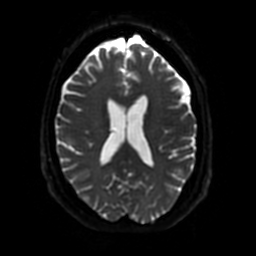
[im 69/104]
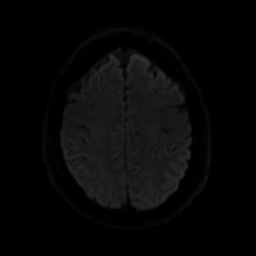
[im 92/104]
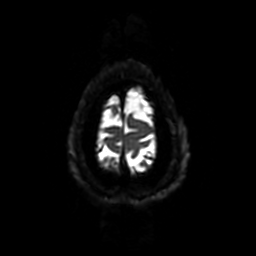
[im 104/104]
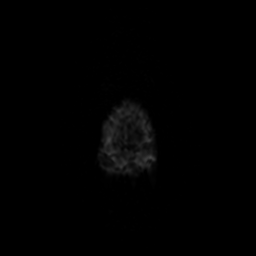

[Series 3: DWI · coronal · 4.0mm · 0.94mm/px · 7 of 74 slices shown (2 of 2)]
[im 1/74]
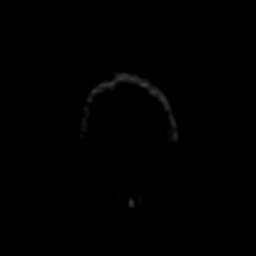
[im 13/74]
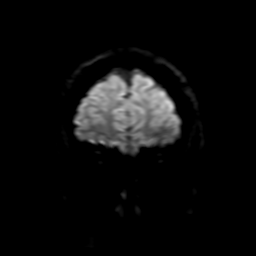
[im 25/74]
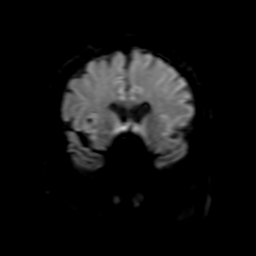
[im 37/74]
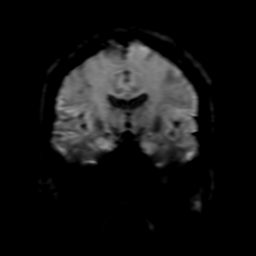
[im 49/74]
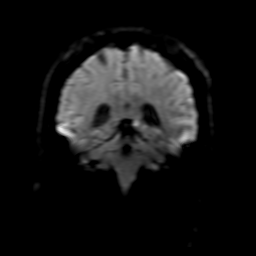
[im 61/74]
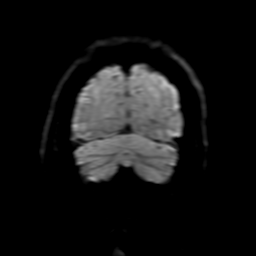
[im 74/74]
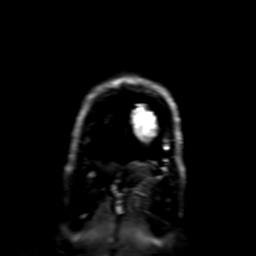

[Series 4: FLAIR · axial · 3.0mm · 0.41mm/px · z∈[-81,+74]mm · 2 of 27 slices shown (1 of 2)]
[im 1/27]
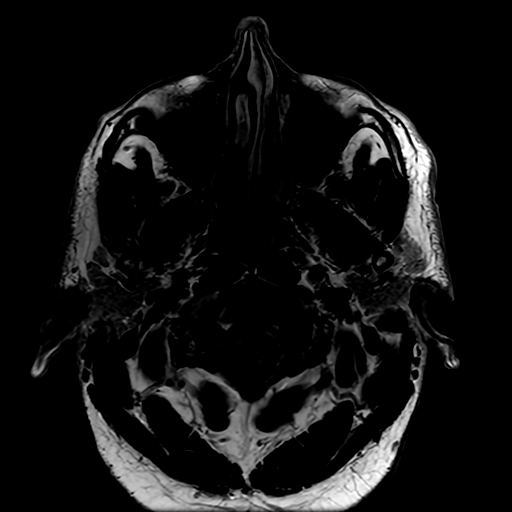
[im 27/27]
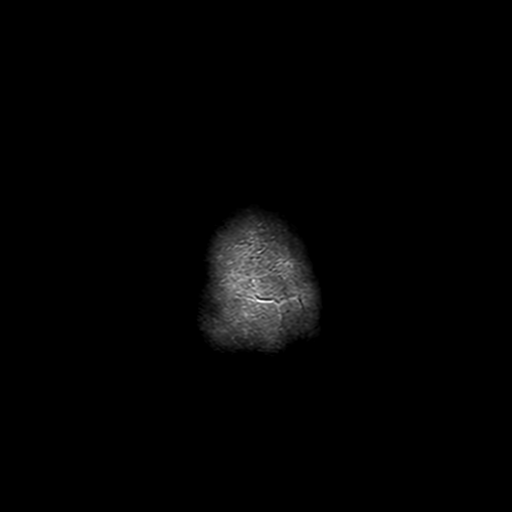

[Series 6: FLAIR · sagittal · 5.0mm · 0.23mm/px · 2 of 25 slices shown (2 of 2)]
[im 1/25]
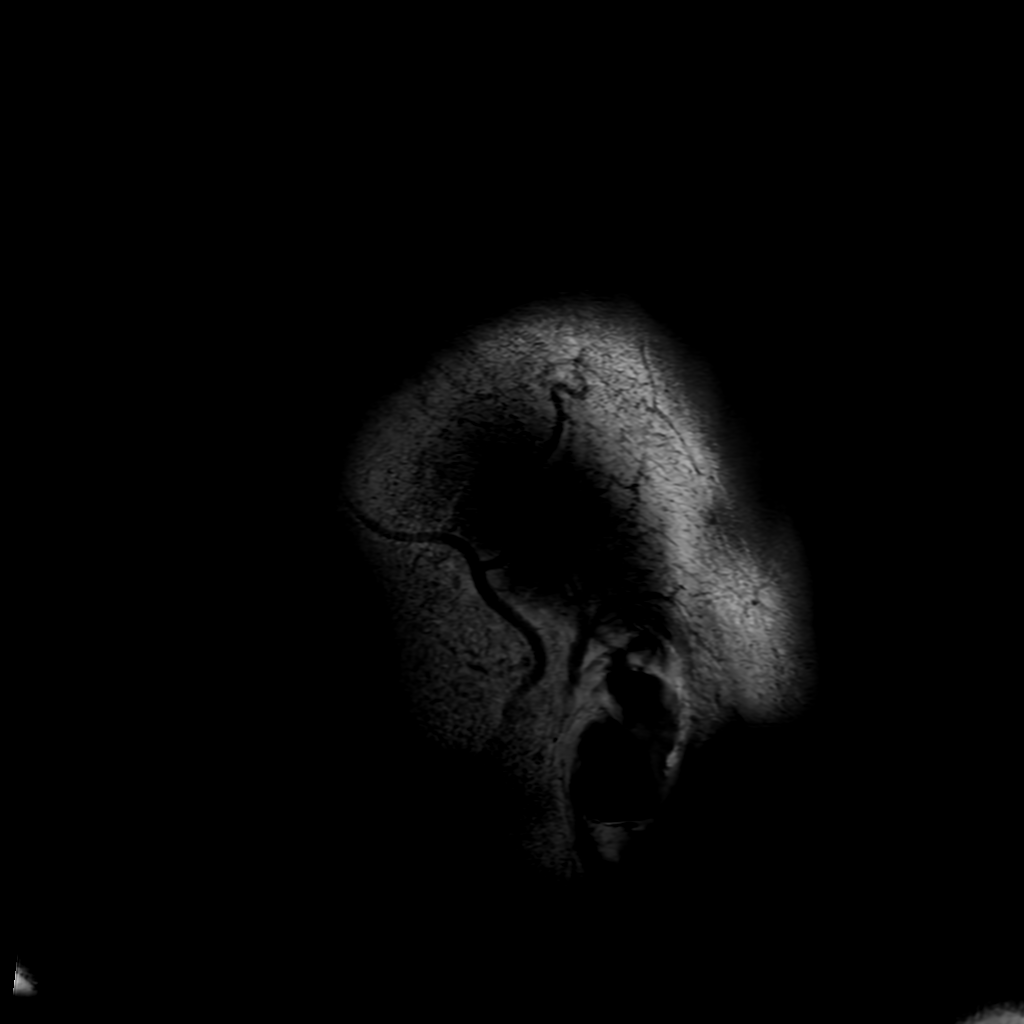
[im 25/25]
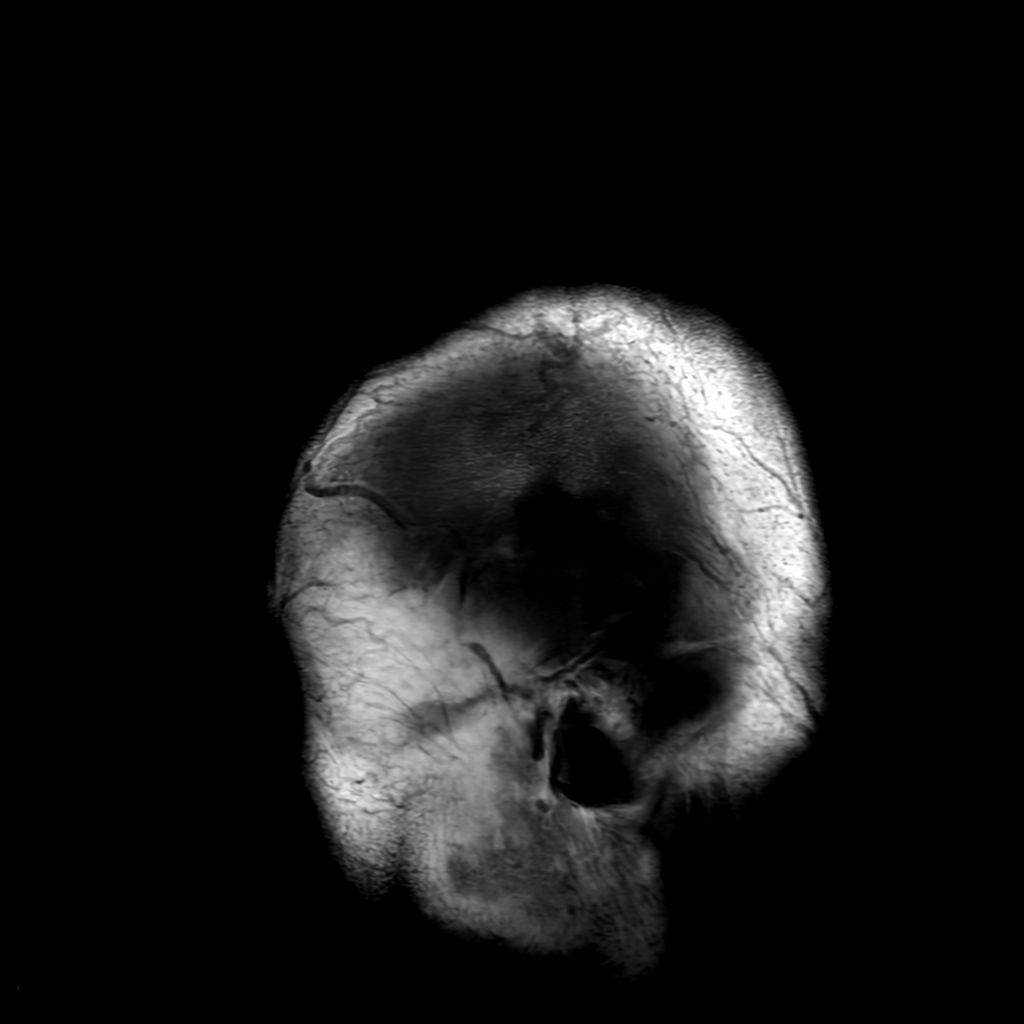

[Series 250: ADC · axial · 3.0mm · 0.94mm/px · z∈[-77,+76]mm · 5 of 52 slices shown (1 of 2)]
[im 1/52]
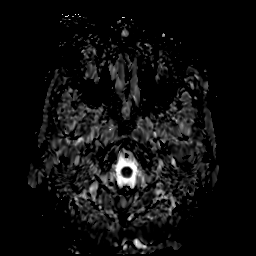
[im 13/52]
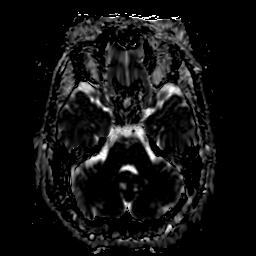
[im 26/52]
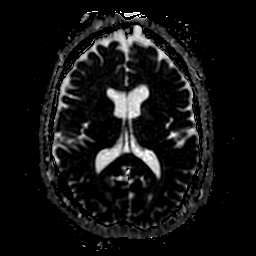
[im 39/52]
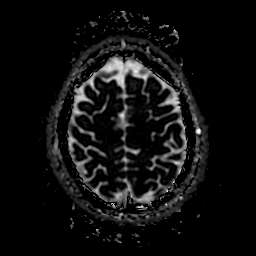
[im 52/52]
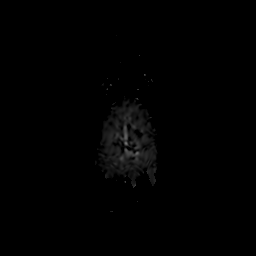

[Series 350: ADC · coronal · 4.0mm · 0.94mm/px · 3 of 37 slices shown (2 of 2)]
[im 1/37]
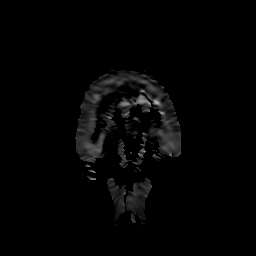
[im 19/37]
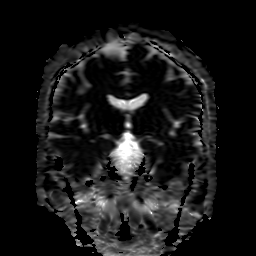
[im 37/37]
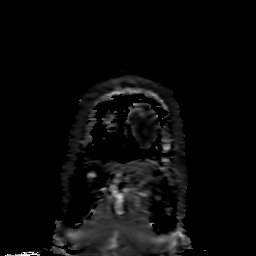

[27 of 48 positions shown; findings below may reference images not displayed]

FINDINGS: MRI HEAD FINDINGS

Brain: Acute infarct involving the left hippocampal tail. No
intracranial hemorrhage. Cerebral volume is within normal limits.
Minimal chronic microvascular ischemic changes. No midline shift,
ventriculomegaly or extra-axial fluid collection. No mass lesion.

Vascular: Please see same day CTA head and neck.

Skull and upper cervical spine: Normal marrow signal.

Sinuses/Orbits: Normal orbits. Clear paranasal sinuses. No mastoid
effusion.

Other: None.

MRI CERVICAL SPINE FINDINGS

Alignment: Straightening of lordosis. Minimal grade 1 C6-7 and C7-T1
anterolisthesis.

Vertebrae: Normal bone marrow signal intensity. No focal osseous
lesion. Congenital spinal canal narrowing.

Cord: Normal signal and morphology.

Posterior Fossa, vertebral arteries: Please see MRI head.

Disc levels: Multilevel desiccation and mild disc space loss.

C2-3: Bilateral uncovertebral and facet hypertrophy. Patent spinal
canal and neural foramen.

C3-4: Central protrusion abutting the ventral cord with
uncovertebral and facet hypertrophy. Mild spinal canal and left
neural foraminal narrowing. Patent right neural foramen.

C4-5: Disc osteophyte complex with superimposed central protrusion,
uncovertebral and bilateral facet hypertrophy. Mild spinal canal and
bilateral neural foraminal narrowing.

C5-6: Small central protrusion. Uncovertebral and bilateral facet
hypertrophy. Mild spinal canal and moderate bilateral neural
foraminal narrowing.

C6-7: Disc osteophyte complex with superimposed right foraminal
protrusion. Right predominant uncovertebral and facet hypertrophy.
Mild spinal canal, moderate right and mild left neural foraminal
narrowing.

C7-T1: No significant disc bulge. Uncovertebral and facet
hypertrophy. Patent spinal canal and right neural foramen. Mild left
neural foraminal narrowing.

Paraspinal tissues: No acute finding.
IMPRESSION: MRI HEAD:

Acute left hippocampal tail infarct. Minimal chronic microvascular
ischemic changes.

These results were called by telephone at the time of interpretation
on [DATE] at [DATE] to provider Dr. HENRY, Who verbally
acknowledged these results.

MRI CERVICAL SPINE:

Congenital spinal canal narrowing with superimposed spondylosis.

No acute finding within the cervical spine.

Mild C4-7 spinal canal narrowing.

Moderate bilateral C5-6 and right C6-7 neural foraminal narrowing.

## 2020-02-05 IMAGING — CT CT ANGIO NECK
1 of 11 series · 6 of 33 positions shown · IV contrast (Omnipaque)
Comparison: Concurrent head CT.

CLINICAL DATA: Transient ischemic attack. Left-sided arm weakness
and blurry vision/dizziness.

EXAM:
CT ANGIOGRAPHY HEAD AND NECK
TECHNIQUE: Multidetector CT imaging of the head and neck was performed using
the standard protocol during bolus administration of intravenous
contrast. Multiplanar CT image reconstructions and MIPs were
obtained to evaluate the vascular anatomy. Carotid stenosis
measurements (when applicable) are obtained utilizing NASCET
criteria, using the distal internal carotid diameter as the
denominator.
CONTRAST:  100mL OMNIPAQUE IOHEXOL 350 MG/ML SOLN

[Series 13: axial thin · axial · 0.43mm/px · z∈[-392,-112]mm · 6 of 395 slices shown]
[im 57/395  soft-tissue]
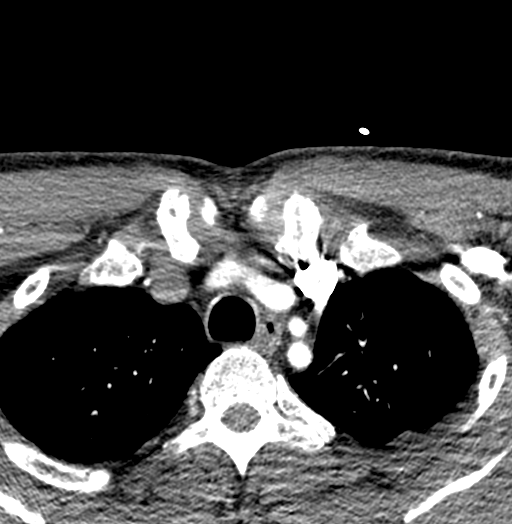
[im 113/395  bone]
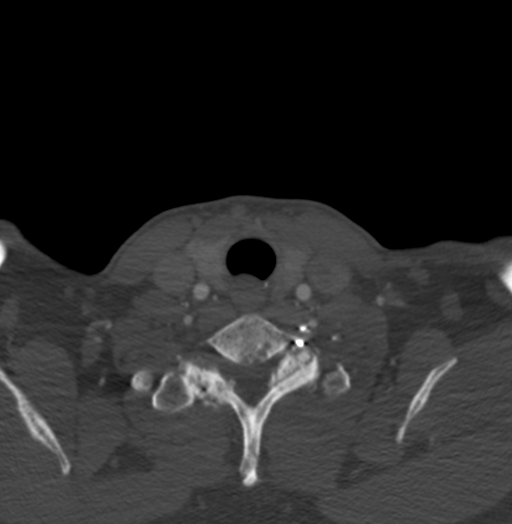
[im 169/395  soft-tissue]
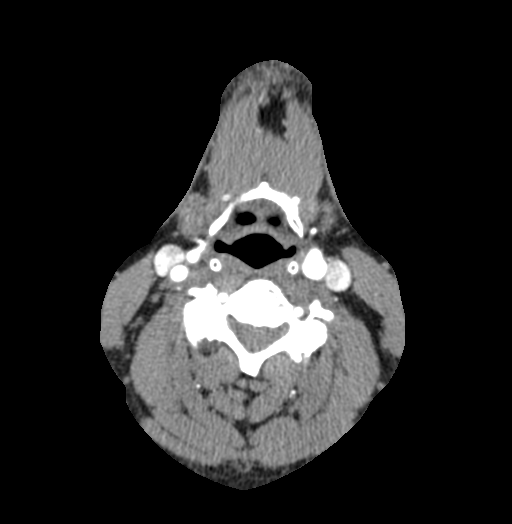
[im 226/395  bone]
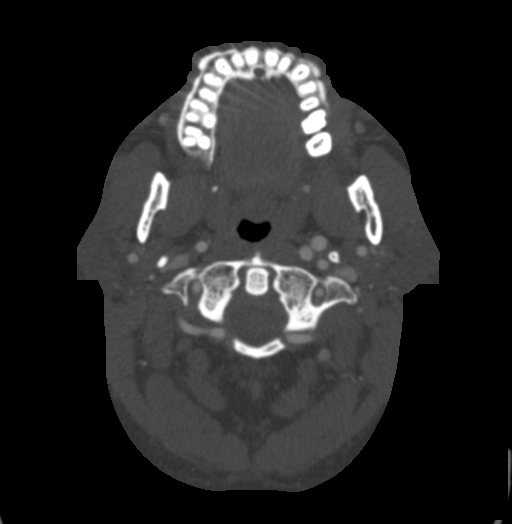
[im 282/395  soft-tissue]
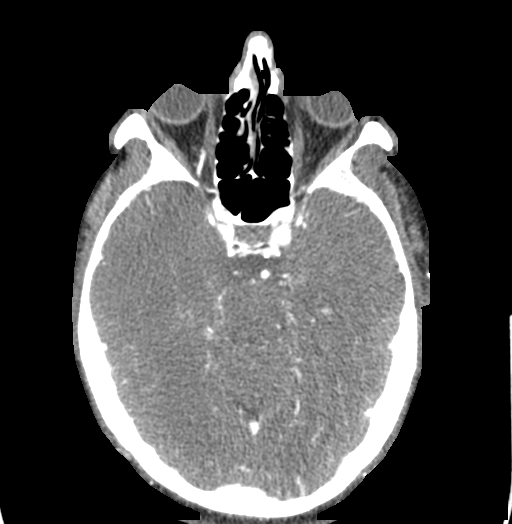
[im 338/395  bone]
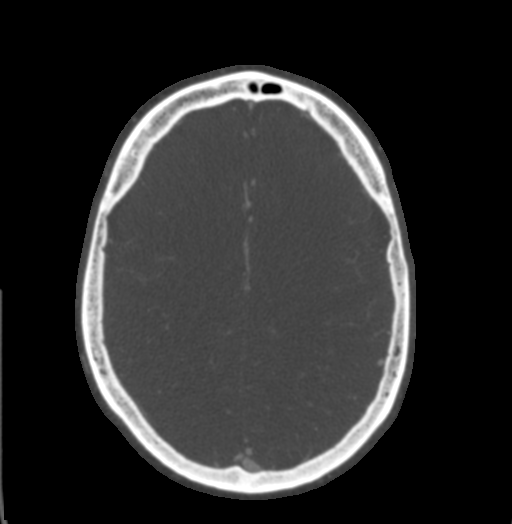

[6 of 33 positions shown; findings below may reference images not displayed]

FINDINGS: CT HEAD FINDINGS

Brain: No evidence of acute large vascular territory infarction,
hemorrhage, hydrocephalus, extra-axial collection or mass
lesion/mass effect.

Skull: Normal. Negative for fracture or focal lesion.

Sinuses: Right maxillary sinus retention cyst. Otherwise, imaged
portions are clear.

Orbits: No acute finding.

Review of the MIP images confirms the above findings

CTA NECK FINDINGS

Aortic arch: Imaged portion shows no evidence of aneurysm or
dissection. No significant stenosis of the major arch vessel
origins.

Right carotid system: No evidence of dissection, stenosis (50% or
greater) or occlusion.

Left carotid system: No evidence of dissection, stenosis (50% or
greater) or occlusion.

Vertebral arteries: Mild multifocal narrowing of the right vertebral
artery secondary to mass effect from adjacent bony osteophytes. No
evidence of significant (greater than 50%) arterial stenosis or
occlusion.

Skeleton: Multilevel degenerative change with degenerative
anterolisthesis of C6 on C7.

Other neck: No acute abnormality.

Upper chest: No acute abnormality.

Review of the MIP images confirms the above findings

CTA HEAD FINDINGS

Anterior circulation: No significant stenosis, proximal occlusion,
aneurysm, or vascular malformation.

Posterior circulation: No significant stenosis, proximal occlusion,
aneurysm, or vascular malformation. Mild left PCA narrowing.

Venous sinuses: As permitted by contrast timing, patent.

Review of the MIP images confirms the above findings
IMPRESSION: 1. No evidence of acute intracranial abnormality.
2. No significant (greater than 50%) arterial stenosis or occlusion
in the head or neck.

## 2020-02-05 MED ORDER — SODIUM CHLORIDE 0.9 % IV SOLN
INTRAVENOUS | Status: DC
  Administered 2020-02-05: 1000 mL via INTRAVENOUS

## 2020-02-05 MED ORDER — ONDANSETRON HCL 4 MG/2ML IJ SOLN
4.0000 mg | Freq: Four times a day (QID) | INTRAMUSCULAR | Status: DC | PRN
  Administered 2020-02-05 – 2020-02-06 (×2): 4 mg via INTRAVENOUS
  Filled 2020-02-05 (×3): qty 2

## 2020-02-05 MED ORDER — ASPIRIN 325 MG PO TABS: 325.0000 mg | ORAL_TABLET | Freq: Every day | ORAL | Status: DC

## 2020-02-05 MED ORDER — ASPIRIN 81 MG PO CHEW
324.0000 mg | CHEWABLE_TABLET | Freq: Once | ORAL | Status: AC
  Administered 2020-02-05: 324 mg via ORAL
  Filled 2020-02-05: qty 4

## 2020-02-05 MED ORDER — ACETAMINOPHEN 650 MG RE SUPP: 650.0000 mg | RECTAL | Status: DC | PRN

## 2020-02-05 MED ORDER — MORPHINE SULFATE (PF) 4 MG/ML IV SOLN: 3.0000 mg | INTRAVENOUS | Status: DC | PRN

## 2020-02-05 MED ORDER — STROKE: EARLY STAGES OF RECOVERY BOOK
Freq: Once | Status: DC
  Filled 2020-02-05: qty 1

## 2020-02-05 MED ORDER — ENOXAPARIN SODIUM 40 MG/0.4ML ~~LOC~~ SOLN
40.0000 mg | SUBCUTANEOUS | Status: DC
  Administered 2020-02-05: 40 mg via SUBCUTANEOUS
  Filled 2020-02-05: qty 0.4

## 2020-02-05 MED ORDER — ACETAMINOPHEN 325 MG PO TABS
650.0000 mg | ORAL_TABLET | ORAL | Status: DC | PRN
  Administered 2020-02-05 – 2020-02-06 (×2): 650 mg via ORAL
  Filled 2020-02-05 (×2): qty 2

## 2020-02-05 MED ORDER — ASPIRIN 300 MG RE SUPP: 300.0000 mg | Freq: Every day | RECTAL | Status: DC

## 2020-02-05 MED ORDER — ACETAMINOPHEN 160 MG/5ML PO SOLN: 650.0000 mg | ORAL | Status: DC | PRN

## 2020-02-05 MED ORDER — ATORVASTATIN CALCIUM 40 MG PO TABS
40.0000 mg | ORAL_TABLET | Freq: Every day | ORAL | Status: DC
  Administered 2020-02-05 – 2020-02-07 (×3): 40 mg via ORAL
  Filled 2020-02-05 (×3): qty 1

## 2020-02-05 MED ORDER — ESCITALOPRAM OXALATE 10 MG PO TABS
10.0000 mg | ORAL_TABLET | Freq: Every day | ORAL | Status: DC
  Administered 2020-02-05 – 2020-02-07 (×3): 10 mg via ORAL
  Filled 2020-02-05 (×3): qty 1

## 2020-02-05 MED ORDER — IOHEXOL 350 MG/ML SOLN
100.0000 mL | Freq: Once | INTRAVENOUS | Status: AC | PRN
  Administered 2020-02-05: 100 mL via INTRAVENOUS

## 2020-02-05 MED ORDER — SENNOSIDES-DOCUSATE SODIUM 8.6-50 MG PO TABS: 1.0000 | ORAL_TABLET | Freq: Every evening | ORAL | Status: DC | PRN

## 2020-02-05 MED ORDER — HYDROCODONE-ACETAMINOPHEN 5-325 MG PO TABS
1.0000 | ORAL_TABLET | ORAL | Status: DC | PRN
  Administered 2020-02-05 – 2020-02-06 (×3): 1 via ORAL
  Filled 2020-02-05 (×3): qty 1

## 2020-02-05 NOTE — ED Provider Notes (Signed)
[facility] EMERGENCY DEPARTMENT Provider Note   CSN: [id number] Arrival date & time: 02/05/20  1008     History Chief Complaint  Patient presents with  . Blurred Vision    Jeffery Willis is a 64 y.o. male.  HPI 64 year old male with a history of Achilles tendinitis anxiety presents to the ER as a ED to ED transfer where he was seen at [facility].  History provided by the patient and per chart review.  Began to have a sudden onset of blurry vision and dizziness on Wednesday, with episode lasting about an hour which then improved.  Then again last night he had a similar episode which included blurry vision, headache and dizziness, also resolved in about an hour.  Woke up this morning without symptoms, but then again began to develop headache numbness and tingling in his arms, worse on his left side.  Patient states that he feels like his vision is off from baseline.  He feels like he cannot focus on things nearly as well.  Denies any nausea or vomiting.  Denies any slurred speech, unilateral weakness, numbness.  No prior history of strokes.  Not on anticoagulation.    Past Medical History:  Diagnosis Date  . History of cardiovascular stress test 2/07   normal myocardial perfusion without evidence of infarction of ischemia.  Given these  results and tthe patient's persistence of flipped T waves, I think these are likely asymmptomatic and  clinically insignificant findings  . Renal calculus    per [facility]  . Skin lesion     Patient Active Problem List   Diagnosis Date Noted  . Health care maintenance 10/08/2017  . Achilles tendonitis 10/08/2017  . Advance care planning 10/08/2017  . Loss of weight 02/07/2016  . Anxiety state 02/07/2016  . Skin lesion 02/07/2016  . Routine general medical examination at a health care facility 03/05/2011  . RENAL CALCULUS 02/14/2010  . SKIN LESION 02/14/2010    Past Surgical History:  Procedure Laterality Date    . 2 D Echo  06/2005   normal LV function and no LVH.  Normal 2D echowith ER60 to 70%  . KIDNEY STONE SURGERY         Family History  Problem Relation Age of Onset  . Heart disease Mother        arrhythmia  . Hypertension Father        mild  . Arthritis Father   . Prostate cancer Paternal Grandfather   . Colon cancer Neg Hx     Social History   Tobacco Use  . Smoking status: Never Smoker  . Smokeless tobacco: Never Used  Vaping Use  . Vaping Use: Never used  Substance Use Topics  . Alcohol use: No  . Drug use: No    Home Medications Prior to Admission medications   Medication Sig Start Date End Date Taking? Authorizing Provider  aspirin EC 81 MG tablet Take 81 mg by mouth daily. Swallow whole.   Yes [provider]  benzonatate (TESSALON PERLES) 100 MG capsule Take 1 capsule (100 mg total) by mouth 3 (three) times daily as needed. 04/03/19   Jannifer Rodney A, FNP  escitalopram (LEXAPRO) 10 MG tablet Take 0.5 tablets (5 mg total) by mouth daily. 03/07/19   Joaquim Nam, MD    Allergies    Patient has no known allergies.  Review of Systems   Review of Systems  Constitutional: Negative for chills and fever.  HENT: Negative for ear pain and sore throat.   Eyes: Positive for visual disturbance. Negative for pain.  Respiratory: Negative for cough and shortness of breath.   Cardiovascular: Negative for chest pain and palpitations.  Gastrointestinal: Negative for abdominal pain and vomiting.  Genitourinary: Negative for dysuria and hematuria.  Musculoskeletal: Negative for arthralgias and back pain.  Skin: Negative for color change and rash.  Neurological: Positive for dizziness and headaches. Negative for seizures, syncope, weakness and numbness.  All other systems reviewed and are negative.   Physical Exam Updated Vital Signs BP 138/74 (BP Location: Right Arm)   Pulse 67   Temp 98 F (36.7 C) (Oral)   Resp 16   Ht  (1.854 m)   Wt 86.2 kg    SpO2 100%   BMI 25.07 kg/m   Physical Exam Vitals and nursing note reviewed.  Constitutional:      General: He is not in acute distress.    Appearance: He is well-developed. He is not ill-appearing, toxic-appearing or diaphoretic.  HENT:     Head: Normocephalic and atraumatic.  Eyes:     Conjunctiva/sclera: Conjunctivae normal.     Pupils: Pupils are equal, round, and reactive to light.  Cardiovascular:     Rate and Rhythm: Normal rate and regular rhythm.     Heart sounds: No murmur heard.   Pulmonary:     Effort: Pulmonary effort is normal. No respiratory distress.     Breath sounds: Normal breath sounds.  Abdominal:     Palpations: Abdomen is soft.     Tenderness: There is no abdominal tenderness.  Musculoskeletal:        General: No tenderness or deformity. Normal range of motion.     Cervical back: Neck supple.  Skin:    General: Skin is warm and dry.  Neurological:     General: No focal deficit present.     Mental Status: He is alert and oriented to person, place, and time.     Comments: Mental Status:  Alert, thought content appropriate, able to give a coherent history. Speech fluent without evidence of aphasia. Able to follow 2 step commands without difficulty.  Cranial Nerves:  II: Peripheral visual fields grossly normal, pupils equal, round, reactive to light III,IV, VI: ptosis not present, extra-ocular motions intact bilaterally  V,VII: smile symmetric, facial light touch sensation equal VIII: hearing grossly normal to voice  X: uvula elevates symmetrically  XI: bilateral shoulder shrug symmetric and strong XII: midline tongue extension without fassiculations Motor:  Normal tone. 5/5 strength of BUE and BLE major muscle groups including strong and equal grip strength and dorsiflexion/plantar flexion Sensory: light touch normal in all extremities. Cerebellar: normal finger-to-nose with bilateral upper extremities, Romberg sign absent Gait: normal gait and  balance. Able to walk on toes and heels with ease.    Psychiatric:        Mood and Affect: Mood normal.     ED Results / Procedures / Treatments   Labs (all labs ordered are listed, but only abnormal results are displayed) Labs Reviewed  BASIC METABOLIC PANEL - Abnormal; Notable for the following components:      Result Value   Glucose, Bld 171 (*)    All other components within normal limits  CBG MONITORING, ED - Abnormal; Notable for the following components:   Glucose-Capillary 189 (*)    All other components within normal limits  ETHANOL  PROTIME-INR  APTT  CBC  DIFFERENTIAL  RAPID URINE DRUG SCREEN,  HOSP PERFORMED  URINALYSIS, ROUTINE W REFLEX MICROSCOPIC    EKG EKG Interpretation  Date/Time:  Sunday February 05 2020 10:51:09 EDT Ventricular Rate:  72 PR Interval:    QRS Duration: 86 QT Interval:  389 QTC Calculation: 426 R Axis:   81 Text Interpretation: poor baseline quality, suspect most likely normal sinus rhythm no acute STEMI Confirmed by Marianna Fuss ([id number]) on 02/05/2020 11:13:49 AM   Radiology CT Angio Head W or Wo Contrast  Result Date: 02/05/2020 CLINICAL DATA:  Transient ischemic attack. Left-sided arm weakness and blurry vision/dizziness. EXAM: CT ANGIOGRAPHY HEAD AND NECK TECHNIQUE: Multidetector CT imaging of the head and neck was performed using the standard protocol during bolus administration of intravenous contrast. Multiplanar CT image reconstructions and MIPs were obtained to evaluate the vascular anatomy. Carotid stenosis measurements (when applicable) are obtained utilizing NASCET criteria, using the distal internal carotid diameter as the denominator. CONTRAST:  OMNIPAQUE IOHEXOL 350 MG/ML SOLN COMPARISON:  Concurrent head CT. FINDINGS: CT HEAD FINDINGS Brain: No evidence of acute large vascular territory infarction, hemorrhage, hydrocephalus, extra-axial collection or mass lesion/mass effect. Skull: Normal. Negative for fracture or  focal lesion. Sinuses: Right maxillary sinus retention cyst. Otherwise, imaged portions are clear. Orbits: No acute finding. Review of the MIP images confirms the above findings CTA NECK FINDINGS Aortic arch: Imaged portion shows no evidence of aneurysm or dissection. No significant stenosis of the major arch vessel origins. Right carotid system: No evidence of dissection, stenosis (50% or greater) or occlusion. Left carotid system: No evidence of dissection, stenosis (50% or greater) or occlusion. Vertebral arteries: Mild multifocal narrowing of the right vertebral artery secondary to mass effect from adjacent bony osteophytes. No evidence of significant (greater than 50%) arterial stenosis or occlusion. Skeleton: Multilevel degenerative change with degenerative anterolisthesis of C6 on C7. Other neck: No acute abnormality. Upper chest: No acute abnormality. Review of the MIP images confirms the above findings CTA HEAD FINDINGS Anterior circulation: No significant stenosis, proximal occlusion, aneurysm, or vascular malformation. Posterior circulation: No significant stenosis, proximal occlusion, aneurysm, or vascular malformation. Mild left PCA narrowing. Venous sinuses: As permitted by contrast timing, patent. Review of the MIP images confirms the above findings IMPRESSION: 1. No evidence of acute intracranial abnormality. 2. No significant (greater than 50%) arterial stenosis or occlusion in the head or neck. Electronically Signed   By: Feliberto Harts MD   On: 02/05/2020 11:53   CT Angio Neck W and/or Wo Contrast  Result Date: 02/05/2020 CLINICAL DATA:  Transient ischemic attack. Left-sided arm weakness and blurry vision/dizziness. EXAM: CT ANGIOGRAPHY HEAD AND NECK TECHNIQUE: Multidetector CT imaging of the head and neck was performed using the standard protocol during bolus administration of intravenous contrast. Multiplanar CT image reconstructions and MIPs were obtained to evaluate the vascular  anatomy. Carotid stenosis measurements (when applicable) are obtained utilizing NASCET criteria, using the distal internal carotid diameter as the denominator. CONTRAST:  OMNIPAQUE IOHEXOL 350 MG/ML SOLN COMPARISON:  Concurrent head CT. FINDINGS: CT HEAD FINDINGS Brain: No evidence of acute large vascular territory infarction, hemorrhage, hydrocephalus, extra-axial collection or mass lesion/mass effect. Skull: Normal. Negative for fracture or focal lesion. Sinuses: Right maxillary sinus retention cyst. Otherwise, imaged portions are clear. Orbits: No acute finding. Review of the MIP images confirms the above findings CTA NECK FINDINGS Aortic arch: Imaged portion shows no evidence of aneurysm or dissection. No significant stenosis of the major arch vessel origins. Right carotid system: No evidence of dissection, stenosis (50% or greater) or occlusion.  Left carotid system: No evidence of dissection, stenosis (50% or greater) or occlusion. Vertebral arteries: Mild multifocal narrowing of the right vertebral artery secondary to mass effect from adjacent bony osteophytes. No evidence of significant (greater than 50%) arterial stenosis or occlusion. Skeleton: Multilevel degenerative change with degenerative anterolisthesis of C6 on C7. Other neck: No acute abnormality. Upper chest: No acute abnormality. Review of the MIP images confirms the above findings CTA HEAD FINDINGS Anterior circulation: No significant stenosis, proximal occlusion, aneurysm, or vascular malformation. Posterior circulation: No significant stenosis, proximal occlusion, aneurysm, or vascular malformation. Mild left PCA narrowing. Venous sinuses: As permitted by contrast timing, patent. Review of the MIP images confirms the above findings IMPRESSION: 1. No evidence of acute intracranial abnormality. 2. No significant (greater than 50%) arterial stenosis or occlusion in the head or neck. Electronically Signed   By: Feliberto Harts MD   On:  02/05/2020 11:53    Procedures Procedures (including critical care time)  Medications Ordered in ED Medications  iohexol (OMNIPAQUE) 350 MG/ML injection 100 mL (100 mLs Intravenous Contrast Given 02/05/20 1111)    ED Course  I have reviewed the triage vital signs and the nursing notes.  Pertinent labs & imaging results that were available during my care of the patient were reviewed by me and considered in my medical decision making (see chart for details).    MDM Rules/Calculators/A&P                         73:49 PM: 64 year old male who presents to the ER today for evaluation of stroke.  Initially seen at [facility] and sent here for further evaluation.  Dr. Jerrell Belfast was consulted and recommended MRI of C-spine and brain.  CTA of the head and neck done at [facility] reviewed by myself, with no evidence of acute abnormalities.  No neuro deficits noted here on my exam.  Given that the patient is outside the window of anticoagulation, did not initiate a code stroke.  Pending imaging. Touched base with Dr. Jerrell Belfast who does not recommend any additional imaging.   Signed out care to [facility] who will oversee his imaging and dispo accordingly. If normal the patient can likely be discharged home. If abnormal, Dr. Jerrell Belfast will need to be consulted    Final Clinical Impression(s) / ED Diagnoses Final diagnoses:  Transient neurological symptoms    Rx / DC Orders ED Discharge Orders    None       Leone Brand 02/05/20 1503    Eber Hong, MD 02/11/20 [phone]

## 2020-02-05 NOTE — ED Triage Notes (Signed)
Pt reports sudden onset of blurred vision onset 9pm.  This morning patient now experiencing headache with numbness and tingling on both sides. Per wife patient had similar episode on Wednesday.

## 2020-02-05 NOTE — ED Notes (Signed)
Patient transported to CT 

## 2020-02-05 NOTE — ED Provider Notes (Signed)
Care assumed from [facility], [place].  Pt was originally seen at [facility] with Dr. Stevie Kern. See his note for full H&P.  Per his note, " Jeffery Willis is a 64 y.o. male.  Presents to ER with concern for episodes of blurry vision, dizziness, headache, numbness and tingling.  History provided by patient and wife.  On Wednesday of this past week, patient was working out per usual when he had sudden onset of blurred vision, dizziness.  Episode lasting less than 1 hour, improved after rest.  Completely resolved before going to bed.  Then last night around 9:00 patient had very similar episode where he felt like he had sudden onset of blurry vision, headache, dizziness.  This resolved and patient went to bed without symptoms.  This morning woke up without symptoms and then developed headache, numbness and tingling in his arms and hands, worse numbness in his left arm.  No associated speech changes, not currently having any vision changes, no current numbness or weakness.  Has mild dull headache, worse on left side present."   Physical Exam  BP 138/74 (BP Location: Right Arm)   Pulse 67   Temp 98 F (36.7 C) (Oral)   Resp 16   Ht 6\' 1"  (1.854 m)   Wt 86.2 kg   SpO2 100%   BMI 25.07 kg/m   Physical Exam Vitals and nursing note reviewed.  Constitutional:      General: He is not in acute distress.    Appearance: He is well-developed.     Comments: Eating a sandwich in no distress.   Eyes:     Conjunctiva/sclera: Conjunctivae normal.  Cardiovascular:     Rate and Rhythm: Normal rate.  Pulmonary:     Effort: Pulmonary effort is normal.  Neurological:     Mental Status: He is alert and oriented to person, place, and time.     Comments: Alert, no facial droop. Clear speech.       ED Course/Procedures     Procedures  Results for orders placed or performed during the hospital encounter of 02/05/20  Basic metabolic panel  Result Value Ref Range   Sodium 138 135 - 145  mmol/L   Potassium 3.8 3.5 - 5.1 mmol/L   Chloride 101 98 - 111 mmol/L   CO2 27 22 - 32 mmol/L   Glucose, Bld 171 (H) 70 - 99 mg/dL   BUN 21 8 - 23 mg/dL   Creatinine, Ser 02/07/20 0.61 - 1.24 mg/dL   Calcium 9.2 8.9 - 3.38 mg/dL   GFR calc non Af Amer >60 >60 mL/min   GFR calc Af Amer >60 >60 mL/min   Anion gap 10 5 - 15  Ethanol  Result Value Ref Range   Alcohol, Ethyl (B) <10 <10 mg/dL  Protime-INR  Result Value Ref Range   Prothrombin Time 13.4 11.4 - 15.2 seconds   INR 1.1 0.8 - 1.2  APTT  Result Value Ref Range   aPTT 31 24 - 36 seconds  CBC  Result Value Ref Range   WBC 8.6 4.0 - 10.5 K/uL   RBC 5.11 4.22 - 5.81 MIL/uL   Hemoglobin 15.7 13.0 - 17.0 g/dL   HCT 25.0 39 - 52 %   MCV 90.0 80.0 - 100.0 fL   MCH 30.7 26.0 - 34.0 pg   MCHC 34.1 30.0 - 36.0 g/dL   RDW 53.9 76.7 - 34.1 %   Platelets 209 150 - 400 K/uL   nRBC 0.0 0.0 -  0.2 %  Differential  Result Value Ref Range   Neutrophils Relative % 77 %   Neutro Abs 6.6 1.7 - 7.7 K/uL   Lymphocytes Relative 13 %   Lymphs Abs 1.2 0.7 - 4.0 K/uL   Monocytes Relative 7 %   Monocytes Absolute 0.6 0 - 1 K/uL   Eosinophils Relative 1 %   Eosinophils Absolute 0.1 0 - 0 K/uL   Basophils Relative 1 %   Basophils Absolute 0.0 0 - 0 K/uL   Immature Granulocytes 1 %   Abs Immature Granulocytes 0.04 0.00 - 0.07 K/uL  Urine rapid drug screen (hosp performed)  Result Value Ref Range   Opiates NONE DETECTED NONE DETECTED   Cocaine NONE DETECTED NONE DETECTED   Benzodiazepines NONE DETECTED NONE DETECTED   Amphetamines NONE DETECTED NONE DETECTED   Tetrahydrocannabinol NONE DETECTED NONE DETECTED   Barbiturates NONE DETECTED NONE DETECTED  Urinalysis, Routine w reflex microscopic Urine, Clean Catch  Result Value Ref Range   Color, Urine YELLOW YELLOW   APPearance CLEAR CLEAR   Specific Gravity, Urine 1.020 1.005 - 1.030   pH 6.0 5.0 - 8.0   Glucose, UA NEGATIVE NEGATIVE mg/dL   Hgb urine dipstick NEGATIVE NEGATIVE    Bilirubin Urine NEGATIVE NEGATIVE   Ketones, ur NEGATIVE NEGATIVE mg/dL   Protein, ur NEGATIVE NEGATIVE mg/dL   Nitrite NEGATIVE NEGATIVE   Leukocytes,Ua NEGATIVE NEGATIVE  CBG monitoring, ED  Result Value Ref Range   Glucose-Capillary 189 (H) 70 - 99 mg/dL   CT Angio Head W or Wo Contrast  Result Date: 02/05/2020 CLINICAL DATA:  Transient ischemic attack. Left-sided arm weakness and blurry vision/dizziness. EXAM: CT ANGIOGRAPHY HEAD AND NECK TECHNIQUE: Multidetector CT imaging of the head and neck was performed using the standard protocol during bolus administration of intravenous contrast. Multiplanar CT image reconstructions and MIPs were obtained to evaluate the vascular anatomy. Carotid stenosis measurements (when applicable) are obtained utilizing NASCET criteria, using the distal internal carotid diameter as the denominator. CONTRAST:  OMNIPAQUE IOHEXOL 350 MG/ML SOLN COMPARISON:  Concurrent head CT. FINDINGS: CT HEAD FINDINGS Brain: No evidence of acute large vascular territory infarction, hemorrhage, hydrocephalus, extra-axial collection or mass lesion/mass effect. Skull: Normal. Negative for fracture or focal lesion. Sinuses: Right maxillary sinus retention cyst. Otherwise, imaged portions are clear. Orbits: No acute finding. Review of the MIP images confirms the above findings CTA NECK FINDINGS Aortic arch: Imaged portion shows no evidence of aneurysm or dissection. No significant stenosis of the major arch vessel origins. Right carotid system: No evidence of dissection, stenosis (50% or greater) or occlusion. Left carotid system: No evidence of dissection, stenosis (50% or greater) or occlusion. Vertebral arteries: Mild multifocal narrowing of the right vertebral artery secondary to mass effect from adjacent bony osteophytes. No evidence of significant (greater than 50%) arterial stenosis or occlusion. Skeleton: Multilevel degenerative change with degenerative anterolisthesis of C6 on  C7. Other neck: No acute abnormality. Upper chest: No acute abnormality. Review of the MIP images confirms the above findings CTA HEAD FINDINGS Anterior circulation: No significant stenosis, proximal occlusion, aneurysm, or vascular malformation. Posterior circulation: No significant stenosis, proximal occlusion, aneurysm, or vascular malformation. Mild left PCA narrowing. Venous sinuses: As permitted by contrast timing, patent. Review of the MIP images confirms the above findings IMPRESSION: 1. No evidence of acute intracranial abnormality. 2. No significant (greater than 50%) arterial stenosis or occlusion in the head or neck. Electronically Signed   By: Feliberto Harts MD   On: 02/05/2020  11:53   CT Angio Neck W and/or Wo Contrast  Result Date: 02/05/2020 CLINICAL DATA:  Transient ischemic attack. Left-sided arm weakness and blurry vision/dizziness. EXAM: CT ANGIOGRAPHY HEAD AND NECK TECHNIQUE: Multidetector CT imaging of the head and neck was performed using the standard protocol during bolus administration of intravenous contrast. Multiplanar CT image reconstructions and MIPs were obtained to evaluate the vascular anatomy. Carotid stenosis measurements (when applicable) are obtained utilizing NASCET criteria, using the distal internal carotid diameter as the denominator. CONTRAST:  OMNIPAQUE IOHEXOL 350 MG/ML SOLN COMPARISON:  Concurrent head CT. FINDINGS: CT HEAD FINDINGS Brain: No evidence of acute large vascular territory infarction, hemorrhage, hydrocephalus, extra-axial collection or mass lesion/mass effect. Skull: Normal. Negative for fracture or focal lesion. Sinuses: Right maxillary sinus retention cyst. Otherwise, imaged portions are clear. Orbits: No acute finding. Review of the MIP images confirms the above findings CTA NECK FINDINGS Aortic arch: Imaged portion shows no evidence of aneurysm or dissection. No significant stenosis of the major arch vessel origins. Right carotid system: No  evidence of dissection, stenosis (50% or greater) or occlusion. Left carotid system: No evidence of dissection, stenosis (50% or greater) or occlusion. Vertebral arteries: Mild multifocal narrowing of the right vertebral artery secondary to mass effect from adjacent bony osteophytes. No evidence of significant (greater than 50%) arterial stenosis or occlusion. Skeleton: Multilevel degenerative change with degenerative anterolisthesis of C6 on C7. Other neck: No acute abnormality. Upper chest: No acute abnormality. Review of the MIP images confirms the above findings CTA HEAD FINDINGS Anterior circulation: No significant stenosis, proximal occlusion, aneurysm, or vascular malformation. Posterior circulation: No significant stenosis, proximal occlusion, aneurysm, or vascular malformation. Mild left PCA narrowing. Venous sinuses: As permitted by contrast timing, patent. Review of the MIP images confirms the above findings IMPRESSION: 1. No evidence of acute intracranial abnormality. 2. No significant (greater than 50%) arterial stenosis or occlusion in the head or neck. Electronically Signed   By: Feliberto Harts MD   On: 02/05/2020 11:53   MR BRAIN WO CONTRAST  Result Date: 02/05/2020 CLINICAL DATA:  Blurry vision, neuro deficit EXAM: MRI HEAD WITHOUT CONTRAST MRI CERVICAL SPINE WITHOUT CONTRAST TECHNIQUE: Multiplanar, multiecho pulse sequences of the brain and surrounding structures, and cervical spine, to include the craniocervical junction and cervicothoracic junction, were obtained without intravenous contrast. COMPARISON:  02/05/2020 CTA head and neck. FINDINGS: MRI HEAD FINDINGS Brain: Acute infarct involving the left hippocampal tail. No intracranial hemorrhage. Cerebral volume is within normal limits. Minimal chronic microvascular ischemic changes. No midline shift, ventriculomegaly or extra-axial fluid collection. No mass lesion. Vascular: Please see same day CTA head and neck. Skull and upper cervical  spine: Normal marrow signal. Sinuses/Orbits: Normal orbits. Clear paranasal sinuses. No mastoid effusion. Other: None. MRI CERVICAL SPINE FINDINGS Alignment: Straightening of lordosis. Minimal grade 1 C6-7 and C7-T1 anterolisthesis. Vertebrae: Normal bone marrow signal intensity. No focal osseous lesion. Congenital spinal canal narrowing. Cord: Normal signal and morphology. Posterior Fossa, vertebral arteries: Please see MRI head. Disc levels: Multilevel desiccation and mild disc space loss. C2-3: Bilateral uncovertebral and facet hypertrophy. Patent spinal canal and neural foramen. C3-4: Central protrusion abutting the ventral cord with uncovertebral and facet hypertrophy. Mild spinal canal and left neural foraminal narrowing. Patent right neural foramen. C4-5: Disc osteophyte complex with superimposed central protrusion, uncovertebral and bilateral facet hypertrophy. Mild spinal canal and bilateral neural foraminal narrowing. C5-6: Small central protrusion. Uncovertebral and bilateral facet hypertrophy. Mild spinal canal and moderate bilateral neural foraminal narrowing. C6-7: Disc osteophyte complex  with superimposed right foraminal protrusion. Right predominant uncovertebral and facet hypertrophy. Mild spinal canal, moderate right and mild left neural foraminal narrowing. C7-T1: No significant disc bulge. Uncovertebral and facet hypertrophy. Patent spinal canal and right neural foramen. Mild left neural foraminal narrowing. Paraspinal tissues: No acute finding. IMPRESSION: MRI HEAD: Acute left hippocampal tail infarct. Minimal chronic microvascular ischemic changes. These results were called by telephone at the time of interpretation on 02/05/2020 at 4:08 pm to provider Dr. Hyacinth MeekerMiller, Who verbally acknowledged these results. MRI CERVICAL SPINE: Congenital spinal canal narrowing with superimposed spondylosis. No acute finding within the cervical spine. Mild C4-7 spinal canal narrowing. Moderate bilateral C5-6 and  right C6-7 neural foraminal narrowing. Electronically Signed   By: Stana Buntinghikanele  Emekauwa M.D.   On: 02/05/2020 16:08   MR Cervical Spine Wo Contrast  Result Date: 02/05/2020 CLINICAL DATA:  Blurry vision, neuro deficit EXAM: MRI HEAD WITHOUT CONTRAST MRI CERVICAL SPINE WITHOUT CONTRAST TECHNIQUE: Multiplanar, multiecho pulse sequences of the brain and surrounding structures, and cervical spine, to include the craniocervical junction and cervicothoracic junction, were obtained without intravenous contrast. COMPARISON:  02/05/2020 CTA head and neck. FINDINGS: MRI HEAD FINDINGS Brain: Acute infarct involving the left hippocampal tail. No intracranial hemorrhage. Cerebral volume is within normal limits. Minimal chronic microvascular ischemic changes. No midline shift, ventriculomegaly or extra-axial fluid collection. No mass lesion. Vascular: Please see same day CTA head and neck. Skull and upper cervical spine: Normal marrow signal. Sinuses/Orbits: Normal orbits. Clear paranasal sinuses. No mastoid effusion. Other: None. MRI CERVICAL SPINE FINDINGS Alignment: Straightening of lordosis. Minimal grade 1 C6-7 and C7-T1 anterolisthesis. Vertebrae: Normal bone marrow signal intensity. No focal osseous lesion. Congenital spinal canal narrowing. Cord: Normal signal and morphology. Posterior Fossa, vertebral arteries: Please see MRI head. Disc levels: Multilevel desiccation and mild disc space loss. C2-3: Bilateral uncovertebral and facet hypertrophy. Patent spinal canal and neural foramen. C3-4: Central protrusion abutting the ventral cord with uncovertebral and facet hypertrophy. Mild spinal canal and left neural foraminal narrowing. Patent right neural foramen. C4-5: Disc osteophyte complex with superimposed central protrusion, uncovertebral and bilateral facet hypertrophy. Mild spinal canal and bilateral neural foraminal narrowing. C5-6: Small central protrusion. Uncovertebral and bilateral facet hypertrophy. Mild spinal  canal and moderate bilateral neural foraminal narrowing. C6-7: Disc osteophyte complex with superimposed right foraminal protrusion. Right predominant uncovertebral and facet hypertrophy. Mild spinal canal, moderate right and mild left neural foraminal narrowing. C7-T1: No significant disc bulge. Uncovertebral and facet hypertrophy. Patent spinal canal and right neural foramen. Mild left neural foraminal narrowing. Paraspinal tissues: No acute finding. IMPRESSION: MRI HEAD: Acute left hippocampal tail infarct. Minimal chronic microvascular ischemic changes. These results were called by telephone at the time of interpretation on 02/05/2020 at 4:08 pm to provider Dr. Hyacinth MeekerMiller, Who verbally acknowledged these results. MRI CERVICAL SPINE: Congenital spinal canal narrowing with superimposed spondylosis. No acute finding within the cervical spine. Mild C4-7 spinal canal narrowing. Moderate bilateral C5-6 and right C6-7 neural foraminal narrowing. Electronically Signed   By: Stana Buntinghikanele  Emekauwa M.D.   On: 02/05/2020 16:08     MDM   64 year old male presenting for evaluation for CVA.  Seen at [facility] prior to arrival with reassuring CT angios of the head/neck.  At shift change, pending MRI brain/cervical spine which did show evidence of an acute left hippocampal infarct.  No emergent findings were found on MR cervical spine however there was some degenerative changes.  Patient given aspirin.  Attending physician, Dr. Hyacinth MeekerMiller spoke with Dr. Jerrell BelfastAurora with  neurology who will evaluate the patient.  4:31 PM CONSULT with Dr. Ophelia Charter who accepts patient for admission        Rayne Du 02/05/20 1631    Eber Hong, MD 02/11/20 [phone]

## 2020-02-05 NOTE — Consult Note (Addendum)
Neurology Consultation  Reason for Consult: Concern for stroke Referring Physician: Dr. Ophelia Charter  CC: blurry vision/dizziness  History is obtained from: patient and provider notes  HPI: Jeffery Willis is a 64 y.o. male with no pertinent risk factor in Phx presents as a transfer from [facility] for concern of stroke.   Per hx, patient started experiencing sudden onset of blurry vision and dizziness on Wednesday, episode lasted approx 1 hour and self-improved with residual deficits. However, last night around 2100 patient experienced yet another similar episode with sudden onset of blurry vision, dizziness, and HA which improved after 15 mins. This AM, patient reports waking up with HA, numbness, tingling of LUE, all symptoms have resolved at time of evaluation except mild L sided HA.    ED course CTA head/neck shows-  1. No evidence of acute intracranial abnormality. 2. No significant (greater than 50%) arterial stenosis or occlusion in the head or neck.  MRI brain-  Acute L hippocampal tail infarct   LKW: 02/04/2020  2100 tpa given?: no, outside window MRS: 0  Premorbid modified Rankin scale (mRS):  0-Completely asymptomatic and back to baseline post-stroke  NIHSS 1a Level of Conscious.: 0 1b LOC Questions: 0 1c LOC Commands: 0 2 Best Gaze: 0 3 Visual: 1 4 Facial Palsy: 0 5a Motor Arm - left: 0 5b Motor Arm - Right: 0 6a Motor Leg - Left: 0 6b Motor Leg - Right: 0 7 Limb Ataxia: 0 8 Sensory: 0 9 Best Language: 0 10 Dysarthria: 0 11 Extinct. and Inatten.: 0 TOTAL: 1   Past Medical History:  Diagnosis Date  . History of cardiovascular stress test 2/07   normal myocardial perfusion without evidence of infarction of ischemia.  Given these  results and tthe patient's persistence of flipped T waves, I think these are likely asymmptomatic and  clinically insignificant findings  . Renal calculus    per [facility]  . Skin lesion     0-Completely asymptomatic and  back to baseline post- stroke Stroke comorbidity- none  Family History  Problem Relation Age of Onset  . Heart disease Mother        arrhythmia  . Hypertension Father        mild  . Arthritis Father   . Prostate cancer Paternal Grandfather   . Colon cancer Neg Hx     Social History:   reports that he has never smoked. He has never used smokeless tobacco. He reports that he does not drink alcohol and does not use drugs.  Medications  Current Facility-Administered Medications:  .  aspirin chewable tablet 324 mg, 324 mg, Oral, Once, Couture, Cortni S, PA-C  Current Outpatient Medications:  .  aspirin EC 81 MG tablet, Take 81 mg by mouth daily. Swallow whole., Disp: , Rfl:  .  benzonatate (TESSALON PERLES) 100 MG capsule, Take 1 capsule (100 mg total) by mouth 3 (three) times daily as needed., Disp: 20 capsule, Rfl: 0 .  escitalopram (LEXAPRO) 10 MG tablet, Take 0.5 tablets (5 mg total) by mouth daily., Disp: 90 tablet, Rfl: 1  ROS:   General ROS: negative for - chills, fatigue, fever, night sweats, weight gain or weight loss Psychological ROS: negative for - behavioral disorder, hallucinations, memory difficulties, mood swings or suicidal ideation Ophthalmic ROS: negative for - blurry vision, double vision, eye pain or loss of vision ENT ROS: negative for - epistaxis, nasal discharge, oral lesions, sore throat, tinnitus or vertigo Allergy and Immunology ROS: negative for - hives  or itchy/watery eyes Hematological and Lymphatic ROS: negative for - bleeding problems, bruising or swollen lymph nodes Endocrine ROS: negative for - galactorrhea, hair pattern changes, polydipsia/polyuria or temperature intolerance Respiratory ROS: negative for - cough, hemoptysis, shortness of breath or wheezing Cardiovascular ROS: negative for - chest pain, dyspnea on exertion, edema or irregular heartbeat Gastrointestinal ROS: negative for - abdominal pain, diarrhea, hematemesis, nausea/vomiting or stool  incontinence Genito-Urinary ROS: negative for - dysuria, hematuria, incontinence or urinary frequency/urgency Musculoskeletal ROS: negative for - joint swelling or muscular weakness Neurological ROS: as noted in HPI Dermatological ROS: negative for rash and skin lesion changes  Exam: Current vital signs: BP 138/74 (BP Location: Right Arm)   Pulse 67   Temp 98 F (36.7 C) (Oral)   Resp 16   Ht 6\' 1"  (1.854 m)   Wt 86.2 kg   SpO2 100%   BMI 25.07 kg/m  Vital signs in last 24 hours: Temp:  [98 F (36.7 C)-98.9 F (37.2 C)] 98 F (36.7 C) (09/26 1255) Pulse Rate:  [67-81] 67 (09/26 1255) Resp:  [14-16] 16 (09/26 1255) BP: (133-157)/(74-90) 138/74 (09/26 1255) SpO2:  [96 %-100 %] 100 % (09/26 1255) Weight:  [86.2 kg] 86.2 kg (09/26 1015)   Constitutional: Appears well-developed and well-nourished.  Psych: Affect appropriate to situation Eyes: No scleral injection HENT: No OP obstrucion Head: Normocephalic.  Cardiovascular: Normal rate and regular rhythm.  Respiratory: Effort normal, non-labored breathing GI: Soft.  No distension. There is no tenderness.  Skin: WDI  Neuro: Mental Status: Patient is awake, alert, oriented to person, place, month, year, and situation. Speech- intact naming, repeating, comprehension Patient is able to give a clear and coherent history.  Cranial Nerves: II: R homonymous superior quadrantanopia  III,IV, VI: EOMI without ptosis or diploplia. Pupils equal, round and reactive to light V: Facial sensation is symmetric to temperature VII: Facial movement is symmetric.  VIII: hearing is intact to voice X: Palat elevates symmetrically XI: Shoulder shrug is symmetric. XII: tongue is midline without atrophy or fasciculations.   Motor: Tone is normal. Bulk is normal. 5/5 strength was present in all four extremities.  No drift or aterixis  Sensory: Sensation is symmetric to light touch and temperature in the arms and legs.  Deep Tendon  Reflexes: 2+ and symmetric in the biceps and patellae.   Plantars: Toes are downgoing bilaterally.   Cerebellar: FNF and HKS are intact bilaterally  NIHSS 1   Labs I have reviewed labs in epic and the results pertinent to this consultation are:  CBC    Component Value Date/Time   WBC 8.6 02/05/2020 1024   RBC 5.11 02/05/2020 1024   HGB 15.7 02/05/2020 1024   HCT 46.0 02/05/2020 1024   PLT 209 02/05/2020 1024   MCV 90.0 02/05/2020 1024   MCH 30.7 02/05/2020 1024   MCHC 34.1 02/05/2020 1024   RDW 12.6 02/05/2020 1024   LYMPHSABS 1.2 02/05/2020 1024   MONOABS 0.6 02/05/2020 1024   EOSABS 0.1 02/05/2020 1024   BASOSABS 0.0 02/05/2020 1024    CMP     Component Value Date/Time   NA 138 02/05/2020 1024   K 3.8 02/05/2020 1024   CL 101 02/05/2020 1024   CO2 27 02/05/2020 1024   GLUCOSE 171 (H) 02/05/2020 1024   BUN 21 02/05/2020 1024   CREATININE 1.06 02/05/2020 1024   CALCIUM 9.2 02/05/2020 1024   PROT 7.2 02/07/2016 1145   ALBUMIN 4.3 02/07/2016 1145   AST 19 02/07/2016 1145  ALT 17 02/07/2016 1145   ALKPHOS 45 02/07/2016 1145   BILITOT 1.0 02/07/2016 1145   GFRNONAA >60 02/05/2020 1024   GFRAA >60 02/05/2020 1024    Lipid Panel     Component Value Date/Time   CHOL 148 10/06/2017 0856   TRIG 49.0 10/06/2017 0856   HDL 53.60 10/06/2017 0856   CHOLHDL 3 10/06/2017 0856   VLDL 9.8 10/06/2017 0856   LDLCALC 84 10/06/2017 0856   LDLDIRECT 78 05/25/2008 0000     Imaging I have reviewed the images obtained:  CTA head/neck- 1. No evidence of acute intracranial abnormality. 2. No significant (greater than 50%) arterial stenosis or occlusion in the head or neck.  MRI examination of the brain Acute left hippocampal tail infarct. Minimal chronic microvascular ischemic changes.   Floreen Comber PA-C [facility] [phone]  Discussed with attending neurologist review of note to follow from Dr. Wilford Corner.    Assessment:  64 yo M with  no significant past medical hx presents with hx of blurry vision, headache, dizziness, found to have acute infarct of L hippocampal tail. Patient has R homonymous superior quadrantanopia on evaluation, at his neurological baseline otherwise with mild L sided headache.  Unclear etiology of this atypical stroke in an area that derives blood supply from PCA branches as well as anterior choroidal arteries.  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 Blood pressures can be gradually normalized to SBP<140 upon discharge. -Echocardiogram -HgbA1c, fasting lipid panel -Frequent neuro checks -Prophylactic therapy-Antiplatelet med: Aspirin - dose 325mg  PO -Atorvastatin 80 mg PO daily -Risk factor modification -PT consult, OT consult, Speech consult -If Afib found on telemetry, will need anticoagulation. Decision pending imaging and stroke team rounding.  Please page stroke NP/PA/MD (listed on AMION)  from 8am-4 pm as this patient will be followed by the stroke team at this point.   , PA-C Neurology   Attending Neurohospitalist Addendum Patient seen and examined with APP/Resident. Agree with the history and physical as documented above. Agree with the plan as documented, which I helped formulate. I have independently reviewed the chart, obtained history, review of systems and examined the patient.I have personally reviewed pertinent head/neck/spine imaging (CT/MRI). Please feel free to call with any questions. --- Val Eagle, MD [facility] Pager: [phone]  If 7pm to 7am, please call on call as listed on AMION.

## 2020-02-05 NOTE — ED Provider Notes (Signed)
[facility] EMERGENCY DEPARTMENT Provider Note   CSN: [id number] Arrival date & time: 02/05/20  1008     History Chief Complaint  Patient presents with  . Blurred Vision    Barre Aydelott is a 64 y.o. male.  Presents to ER with concern for episodes of blurry vision, dizziness, headache, numbness and tingling.  History provided by patient and wife.  On Wednesday of this past week, patient was working out per usual when he had sudden onset of blurred vision, dizziness.  Episode lasting less than 1 hour, improved after rest.  Completely resolved before going to bed.  Then last night around 9:00 patient had very similar episode where he felt like he had sudden onset of blurry vision, headache, dizziness.  This resolved and patient went to bed without symptoms.  This morning woke up without symptoms and then developed headache, numbness and tingling in his arms and hands, worse numbness in his left arm.  No associated speech changes, not currently having any vision changes, no current numbness or weakness.  Has mild dull headache, worse on left side present.  HPI     Past Medical History:  Diagnosis Date  . History of cardiovascular stress test 2/07   normal myocardial perfusion without evidence of infarction of ischemia.  Given these  results and tthe patient's persistence of flipped T waves, I think these are likely asymmptomatic and  clinically insignificant findings  . Renal calculus    per [facility]  . Skin lesion     Patient Active Problem List   Diagnosis Date Noted  . Health care maintenance 10/08/2017  . Achilles tendonitis 10/08/2017  . Advance care planning 10/08/2017  . Loss of weight 02/07/2016  . Anxiety state 02/07/2016  . Skin lesion 02/07/2016  . Routine general medical examination at a health care facility 03/05/2011  . RENAL CALCULUS 02/14/2010  . SKIN LESION 02/14/2010    Past Surgical History:  Procedure Laterality Date  . 2 D Echo   06/2005   normal LV function and no LVH.  Normal 2D echowith ER60 to 70%  . KIDNEY STONE SURGERY         Family History  Problem Relation Age of Onset  . Heart disease Mother        arrhythmia  . Hypertension Father        mild  . Arthritis Father   . Prostate cancer Paternal Grandfather   . Colon cancer Neg Hx     Social History   Tobacco Use  . Smoking status: Never Smoker  . Smokeless tobacco: Never Used  Vaping Use  . Vaping Use: Never used  Substance Use Topics  . Alcohol use: No  . Drug use: No    Home Medications Prior to Admission medications   Medication Sig Start Date End Date Taking? Authorizing Provider  aspirin EC 81 MG tablet Take 81 mg by mouth daily. Swallow whole.   Yes [provider]  benzonatate (TESSALON PERLES) 100 MG capsule Take 1 capsule (100 mg total) by mouth 3 (three) times daily as needed. 04/03/19   Jannifer Rodney A, FNP  escitalopram (LEXAPRO) 10 MG tablet Take 0.5 tablets (5 mg total) by mouth daily. 03/07/19   Joaquim Nam, MD    Allergies    Patient has no known allergies.  Review of Systems   Review of Systems  Constitutional: Negative for chills and fever.  HENT: Negative for ear pain and sore throat.   Eyes: Positive  for pain and visual disturbance.  Respiratory: Negative for cough and shortness of breath.   Cardiovascular: Negative for chest pain and palpitations.  Gastrointestinal: Negative for abdominal pain and vomiting.  Genitourinary: Negative for dysuria and hematuria.  Musculoskeletal: Negative for arthralgias and back pain.  Skin: Negative for color change and rash.  Neurological: Positive for dizziness, light-headedness and numbness. Negative for seizures and syncope.  All other systems reviewed and are negative.   Physical Exam Updated Vital Signs BP 138/74 (BP Location: Right Arm)   Pulse 67   Temp 98 F (36.7 C) (Oral)   Resp 16   Ht 6\' 1"  (1.854 m)   Wt 86.2 kg   SpO2 100%   BMI 25.07  kg/m   Physical Exam Vitals and nursing note reviewed.  Constitutional:      Appearance: He is well-developed.  HENT:     Head: Normocephalic and atraumatic.  Eyes:     Conjunctiva/sclera: Conjunctivae normal.  Cardiovascular:     Rate and Rhythm: Normal rate and regular rhythm.     Heart sounds: No murmur heard.   Pulmonary:     Effort: Pulmonary effort is normal. No respiratory distress.     Breath sounds: Normal breath sounds.  Abdominal:     Palpations: Abdomen is soft.     Tenderness: There is no abdominal tenderness.  Musculoskeletal:        General: No deformity or signs of injury.     Cervical back: Neck supple.  Skin:    General: Skin is warm and dry.  Neurological:     Mental Status: He is alert.     Comments: AAOx3 CN 2-12 intact, speech clear visual fields intact 5/5 strength in b/l UE and LE Sensation to light touch intact in b/l UE and LE Normal FNF Normal gait  Psychiatric:        Mood and Affect: Mood normal.        Behavior: Behavior normal.     ED Results / Procedures / Treatments   Labs (all labs ordered are listed, but only abnormal results are displayed) Labs Reviewed  BASIC METABOLIC PANEL - Abnormal; Notable for the following components:      Result Value   Glucose, Bld 171 (*)    All other components within normal limits  CBG MONITORING, ED - Abnormal; Notable for the following components:   Glucose-Capillary 189 (*)    All other components within normal limits  ETHANOL  PROTIME-INR  APTT  CBC  DIFFERENTIAL  RAPID URINE DRUG SCREEN, HOSP PERFORMED  URINALYSIS, ROUTINE W REFLEX MICROSCOPIC    EKG EKG Interpretation  Date/Time:  Sunday February 05 2020 10:51:09 EDT Ventricular Rate:  72 PR Interval:    QRS Duration: 86 QT Interval:  389 QTC Calculation: 426 R Axis:   81 Text Interpretation: poor baseline quality, suspect most likely normal sinus rhythm no acute STEMI Confirmed by Marianna Fussykstra, Joziah Dollins ([id number]) on 02/05/2020  11:13:49 AM   Radiology CT Angio Head W or Wo Contrast  Result Date: 02/05/2020 CLINICAL DATA:  Transient ischemic attack. Left-sided arm weakness and blurry vision/dizziness. EXAM: CT ANGIOGRAPHY HEAD AND NECK TECHNIQUE: Multidetector CT imaging of the head and neck was performed using the standard protocol during bolus administration of intravenous contrast. Multiplanar CT image reconstructions and MIPs were obtained to evaluate the vascular anatomy. Carotid stenosis measurements (when applicable) are obtained utilizing NASCET criteria, using the distal internal carotid diameter as the denominator. CONTRAST:  100mL OMNIPAQUE IOHEXOL 350 MG/ML SOLN  COMPARISON:  Concurrent head CT. FINDINGS: CT HEAD FINDINGS Brain: No evidence of acute large vascular territory infarction, hemorrhage, hydrocephalus, extra-axial collection or mass lesion/mass effect. Skull: Normal. Negative for fracture or focal lesion. Sinuses: Right maxillary sinus retention cyst. Otherwise, imaged portions are clear. Orbits: No acute finding. Review of the MIP images confirms the above findings CTA NECK FINDINGS Aortic arch: Imaged portion shows no evidence of aneurysm or dissection. No significant stenosis of the major arch vessel origins. Right carotid system: No evidence of dissection, stenosis (50% or greater) or occlusion. Left carotid system: No evidence of dissection, stenosis (50% or greater) or occlusion. Vertebral arteries: Mild multifocal narrowing of the right vertebral artery secondary to mass effect from adjacent bony osteophytes. No evidence of significant (greater than 50%) arterial stenosis or occlusion. Skeleton: Multilevel degenerative change with degenerative anterolisthesis of C6 on C7. Other neck: No acute abnormality. Upper chest: No acute abnormality. Review of the MIP images confirms the above findings CTA HEAD FINDINGS Anterior circulation: No significant stenosis, proximal occlusion, aneurysm, or vascular  malformation. Posterior circulation: No significant stenosis, proximal occlusion, aneurysm, or vascular malformation. Mild left PCA narrowing. Venous sinuses: As permitted by contrast timing, patent. Review of the MIP images confirms the above findings IMPRESSION: 1. No evidence of acute intracranial abnormality. 2. No significant (greater than 50%) arterial stenosis or occlusion in the head or neck. Electronically Signed   By: Feliberto Harts MD   On: 02/05/2020 11:53   CT Angio Neck W and/or Wo Contrast  Result Date: 02/05/2020 CLINICAL DATA:  Transient ischemic attack. Left-sided arm weakness and blurry vision/dizziness. EXAM: CT ANGIOGRAPHY HEAD AND NECK TECHNIQUE: Multidetector CT imaging of the head and neck was performed using the standard protocol during bolus administration of intravenous contrast. Multiplanar CT image reconstructions and MIPs were obtained to evaluate the vascular anatomy. Carotid stenosis measurements (when applicable) are obtained utilizing NASCET criteria, using the distal internal carotid diameter as the denominator. CONTRAST:  OMNIPAQUE IOHEXOL 350 MG/ML SOLN COMPARISON:  Concurrent head CT. FINDINGS: CT HEAD FINDINGS Brain: No evidence of acute large vascular territory infarction, hemorrhage, hydrocephalus, extra-axial collection or mass lesion/mass effect. Skull: Normal. Negative for fracture or focal lesion. Sinuses: Right maxillary sinus retention cyst. Otherwise, imaged portions are clear. Orbits: No acute finding. Review of the MIP images confirms the above findings CTA NECK FINDINGS Aortic arch: Imaged portion shows no evidence of aneurysm or dissection. No significant stenosis of the major arch vessel origins. Right carotid system: No evidence of dissection, stenosis (50% or greater) or occlusion. Left carotid system: No evidence of dissection, stenosis (50% or greater) or occlusion. Vertebral arteries: Mild multifocal narrowing of the right vertebral artery  secondary to mass effect from adjacent bony osteophytes. No evidence of significant (greater than 50%) arterial stenosis or occlusion. Skeleton: Multilevel degenerative change with degenerative anterolisthesis of C6 on C7. Other neck: No acute abnormality. Upper chest: No acute abnormality. Review of the MIP images confirms the above findings CTA HEAD FINDINGS Anterior circulation: No significant stenosis, proximal occlusion, aneurysm, or vascular malformation. Posterior circulation: No significant stenosis, proximal occlusion, aneurysm, or vascular malformation. Mild left PCA narrowing. Venous sinuses: As permitted by contrast timing, patent. Review of the MIP images confirms the above findings IMPRESSION: 1. No evidence of acute intracranial abnormality. 2. No significant (greater than 50%) arterial stenosis or occlusion in the head or neck. Electronically Signed   By: Feliberto Harts MD   On: 02/05/2020 11:53    Procedures Procedures (including critical care  time)  Medications Ordered in ED Medications  iohexol (OMNIPAQUE) 350 MG/ML injection 100 mL (100 mLs Intravenous Contrast Given 02/05/20 1111)    ED Course  I have reviewed the triage vital signs and the nursing notes.  Pertinent labs & imaging results that were available during my care of the patient were reviewed by me and considered in my medical decision making (see chart for details).    MDM Rules/Calculators/A&P                         64 year old male presents to ER with concern for episodes of dizziness, blurry vision, arm/hand numbness/tingling.  Currently he is asymptomatic, normal neurologic exam.  Initial EKG read atrial fibrillation, but based on repeat EKG, suspect patient is in sinus rhythm.  Concern for possible TIAs, stroke.  CTA head and neck were negative.  Reviewed case with Dr. Jerrell Belfast at [facility] with neurology.  He recommended ER to ER transfer, obtaining MRI brain and C-spine.  Discussed case with Dr. Judd Lien at  [facility], ER.  He has accepted patient.  Reviewed risks and benefits of monitor transport versus POV.  Patient and wife strongly desire to go POV.  Instructed to go directly to [facility] ER.    Final Clinical Impression(s) / ED Diagnoses Final diagnoses:  Transient neurological symptoms    Rx / DC Orders ED Discharge Orders    None       Milagros Loll, MD 02/05/20 1301

## 2020-02-05 NOTE — ED Notes (Signed)
ED provider at bedside.  Pt reports awoke with left sided headache and left eye pain, moves all extremities equally, steady gait.  Denies chest pain, states took full dose aspirin 325mg  prior to arrival this morning as well as last night when visual changes began.  Last seen normal was 9pm last night.

## 2020-02-05 NOTE — ED Notes (Signed)
Pt for transfer to [facility] ER for MRI, accepted by Dr Judd Lien,  Dr Stevie Kern approved transport via private vehicle, Art therapist charge notified.  Pt for transfer with private vehicle.

## 2020-02-05 NOTE — Discharge Instructions (Addendum)
Wound care instructions (heart monitor implant site) Keep incision clean and dry for 3 days. No driving for 2 days.  You can remove outer dressing tomorrow. Leave steri-strips (little pieces of tape) on until seen in the office for wound check appointment. Call the office [phone]) for redness, drainage, swelling, or fever.

## 2020-02-05 NOTE — H&P (Signed)
History and Physical    Jeffery Willis ZOX:[id number] DOB: October 27, 1955 DOA: 02/05/2020  PCP: Joaquim Nam, MD Consultants:  None Patient coming from:  Home - lives with wife and college-aged children; NOK: Wife, [phone]  Chief Complaint: Visual disturbance  HPI: Jeffery Willis is a 64 y.o. male with medical history significant of anxiety presenting with acute onset of blurred vision last night.  The patient reports that he is very healthy, only takes Lexapro daily and ASA 81 mg (his wife suggested it about a year ago and he takes daily).  He went the to [facility] on Wednesday and felt quite dizzy, but they gave him some glucose and he felt better.  About 830 last night (9/25), he developed acutely abnormal vision.  It was blurry but also appeared to be in different planes, and it was very scary.  He was unable to do anything with his vision so altered, and it lasted for about 5 minutes that bad and then improved but never normalized.  He had some ataxia as well as L temporoparietal and eye pain.  He also noticed B arm tingling and his left leg "felt different."  He had significant difficulty focusing his vision.  He went to [facility] this AM for evaluation.  Head CT was negative and he was transferred to the [facility] ER for MRI.  Currently, he still has L temporoparietal pain and L eye pain with ongoing difficulty focusing his vision.    ED Course: [facility] for blurred vision.  Sent here for MRI which shows acute hippocampal CVA. Neurology to see.  Given ASA.  Review of Systems: As per HPI; otherwise review of systems reviewed and negative.   Ambulatory Status:  Ambulates without assistance   Past Medical History:  Diagnosis Date  . Anxiety   . History of cardiovascular stress test 2/07   normal myocardial perfusion without evidence of infarction of ischemia.  Given these  results and tthe patient's persistence of flipped T waves, I think these are likely asymmptomatic and  clinically insignificant  findings  . Renal calculus    per [facility]  . Skin lesion     Past Surgical History:  Procedure Laterality Date  . 2 D Echo  06/2005   normal LV function and no LVH.  Normal 2D echowith ER60 to 70%  . KIDNEY STONE SURGERY      Social History   Socioeconomic History  . Marital status: Married    Spouse name: Not on file  . Number of children: 2  . Years of education: Not on file  . Highest education level: Not on file  Occupational History  . Occupation: Teacher, early years/pre, [employer] school, works Engineering geologist in [employer]  . Smoking status: Never Smoker  . Smokeless tobacco: Never Used  Vaping Use  . Vaping Use: Never used  Substance and Sexual Activity  . Alcohol use: No  . Drug use: No  . Sexual activity: Not on file  Other Topics Concern  . Not on file  Social History Narrative   Regular exercise, jogs 3 times a week.   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 Difficulty of Paying Living Expenses: Not on file  Food Insecurity:   . Worried About Programme researcher, broadcasting/film/video in the Last Year:  Not on file  . Ran Out of Food in the Last Year: Not on file  Transportation Needs:   . Lack of Transportation (Medical): Not on file  . Lack of Transportation (Non-Medical): Not on file  Physical Activity:   . Days of Exercise per Week: Not on file  . Minutes of Exercise per Session: Not on file  Stress:   . Feeling of Stress : Not on file  Social Connections:   . Frequency of Communication with Friends and Family: Not on file  . Frequency of Social Gatherings with Friends and Family: Not on file  . Attends Religious Services: Not on file  . Active Member of Clubs or Organizations: Not on file  . Attends Banker Meetings: Not on file  . Marital Status: Not on file  Intimate  Partner Violence:   . Fear of Current or Ex-Partner: Not on file  . Emotionally Abused: Not on file  . Physically Abused: Not on file  . Sexually Abused: Not on file    No Known Allergies  Family History  Problem Relation Age of Onset  . Heart disease Mother        arrhythmia  . Hypertension Father        mild  . Arthritis Father   . Prostate cancer Paternal Grandfather   . Colon cancer Neg Hx   . Stroke Neg Hx     Prior to Admission medications   Medication Sig Start Date End Date Taking? Authorizing Provider  aspirin EC 81 MG tablet Take 81 mg by mouth daily. Swallow whole.   Yes [provider]  benzonatate (TESSALON PERLES) 100 MG capsule Take 1 capsule (100 mg total) by mouth 3 (three) times daily as needed. 04/03/19   Jannifer Rodney A, FNP  escitalopram (LEXAPRO) 10 MG tablet Take 0.5 tablets (5 mg total) by mouth daily. 03/07/19   Joaquim Nam, MD    Physical Exam: Vitals:   02/05/20 1100 02/05/20 1120 02/05/20 1130 02/05/20 1255  BP: (!) 141/82 137/79 133/78 138/74  Pulse: 70 72 68 67  Resp: 15 14 15 16   Temp:    98 F (36.7 C)  TempSrc:    Oral  SpO2: 99% 97% 96% 100%  Weight:      Height:         . General:  Appears calm and comfortable and is NAD, mildly anxious (appropriately so) . Eyes:   EOMI, normal lids, iris . ENT:  grossly normal hearing, lips & tongue, mmm; appropriate dentition . Neck:  no LAD, masses or thyromegaly; no carotid bruits . Cardiovascular:  RRR, no m/r/g. No LE edema.  Respiratory:   CTA bilaterally with no wheezes/rales/rhonchi.  Normal respiratory effort. . Abdomen:  soft, NT, ND, NABS . Skin:  no rash or induration seen on limited exam . Musculoskeletal:  grossly normal tone BUE/BLE, good ROM, no bony abnormality . Lower extremity:  No LE edema.  Limited foot exam with no ulcerations.  2+ distal pulses. Marland Kitchen Psychiatric:  grossly normal mood and affect, speech fluent and appropriate, AOx3 . Neurologic:  CN 2-12  grossly intact, moves all extremities in coordinated fashion, sensation intact    Radiological Exams on Admission: CT Angio Head W or Wo Contrast  Result Date: 02/05/2020 CLINICAL DATA:  Transient ischemic attack. Left-sided arm weakness and blurry vision/dizziness. EXAM: CT ANGIOGRAPHY HEAD AND NECK TECHNIQUE: Multidetector CT imaging of the head and neck was performed using the standard protocol during bolus administration  of intravenous contrast. Multiplanar CT image reconstructions and MIPs were obtained to evaluate the vascular anatomy. Carotid stenosis measurements (when applicable) are obtained utilizing NASCET criteria, using the distal internal carotid diameter as the denominator. CONTRAST:  OMNIPAQUE IOHEXOL 350 MG/ML SOLN COMPARISON:  Concurrent head CT. FINDINGS: CT HEAD FINDINGS Brain: No evidence of acute large vascular territory infarction, hemorrhage, hydrocephalus, extra-axial collection or mass lesion/mass effect. Skull: Normal. Negative for fracture or focal lesion. Sinuses: Right maxillary sinus retention cyst. Otherwise, imaged portions are clear. Orbits: No acute finding. Review of the MIP images confirms the above findings CTA NECK FINDINGS Aortic arch: Imaged portion shows no evidence of aneurysm or dissection. No significant stenosis of the major arch vessel origins. Right carotid system: No evidence of dissection, stenosis (50% or greater) or occlusion. Left carotid system: No evidence of dissection, stenosis (50% or greater) or occlusion. Vertebral arteries: Mild multifocal narrowing of the right vertebral artery secondary to mass effect from adjacent bony osteophytes. No evidence of significant (greater than 50%) arterial stenosis or occlusion. Skeleton: Multilevel degenerative change with degenerative anterolisthesis of C6 on C7. Other neck: No acute abnormality. Upper chest: No acute abnormality. Review of the MIP images confirms the above findings CTA HEAD FINDINGS  Anterior circulation: No significant stenosis, proximal occlusion, aneurysm, or vascular malformation. Posterior circulation: No significant stenosis, proximal occlusion, aneurysm, or vascular malformation. Mild left PCA narrowing. Venous sinuses: As permitted by contrast timing, patent. Review of the MIP images confirms the above findings IMPRESSION: 1. No evidence of acute intracranial abnormality. 2. No significant (greater than 50%) arterial stenosis or occlusion in the head or neck. Electronically Signed   By: Feliberto Harts MD   On: 02/05/2020 11:53   CT Angio Neck W and/or Wo Contrast  Result Date: 02/05/2020 CLINICAL DATA:  Transient ischemic attack. Left-sided arm weakness and blurry vision/dizziness. EXAM: CT ANGIOGRAPHY HEAD AND NECK TECHNIQUE: Multidetector CT imaging of the head and neck was performed using the standard protocol during bolus administration of intravenous contrast. Multiplanar CT image reconstructions and MIPs were obtained to evaluate the vascular anatomy. Carotid stenosis measurements (when applicable) are obtained utilizing NASCET criteria, using the distal internal carotid diameter as the denominator. CONTRAST:  OMNIPAQUE IOHEXOL 350 MG/ML SOLN COMPARISON:  Concurrent head CT. FINDINGS: CT HEAD FINDINGS Brain: No evidence of acute large vascular territory infarction, hemorrhage, hydrocephalus, extra-axial collection or mass lesion/mass effect. Skull: Normal. Negative for fracture or focal lesion. Sinuses: Right maxillary sinus retention cyst. Otherwise, imaged portions are clear. Orbits: No acute finding. Review of the MIP images confirms the above findings CTA NECK FINDINGS Aortic arch: Imaged portion shows no evidence of aneurysm or dissection. No significant stenosis of the major arch vessel origins. Right carotid system: No evidence of dissection, stenosis (50% or greater) or occlusion. Left carotid system: No evidence of dissection, stenosis (50% or greater) or  occlusion. Vertebral arteries: Mild multifocal narrowing of the right vertebral artery secondary to mass effect from adjacent bony osteophytes. No evidence of significant (greater than 50%) arterial stenosis or occlusion. Skeleton: Multilevel degenerative change with degenerative anterolisthesis of C6 on C7. Other neck: No acute abnormality. Upper chest: No acute abnormality. Review of the MIP images confirms the above findings CTA HEAD FINDINGS Anterior circulation: No significant stenosis, proximal occlusion, aneurysm, or vascular malformation. Posterior circulation: No significant stenosis, proximal occlusion, aneurysm, or vascular malformation. Mild left PCA narrowing. Venous sinuses: As permitted by contrast timing, patent. Review of the MIP images confirms the above findings IMPRESSION: 1.  No evidence of acute intracranial abnormality. 2. No significant (greater than 50%) arterial stenosis or occlusion in the head or neck. Electronically Signed   By: Feliberto HartsFrederick S Jones MD   On: 02/05/2020 11:53   MR BRAIN WO CONTRAST  Result Date: 02/05/2020 CLINICAL DATA:  Blurry vision, neuro deficit EXAM: MRI HEAD WITHOUT CONTRAST MRI CERVICAL SPINE WITHOUT CONTRAST TECHNIQUE: Multiplanar, multiecho pulse sequences of the brain and surrounding structures, and cervical spine, to include the craniocervical junction and cervicothoracic junction, were obtained without intravenous contrast. COMPARISON:  02/05/2020 CTA head and neck. FINDINGS: MRI HEAD FINDINGS Brain: Acute infarct involving the left hippocampal tail. No intracranial hemorrhage. Cerebral volume is within normal limits. Minimal chronic microvascular ischemic changes. No midline shift, ventriculomegaly or extra-axial fluid collection. No mass lesion. Vascular: Please see same day CTA head and neck. Skull and upper cervical spine: Normal marrow signal. Sinuses/Orbits: Normal orbits. Clear paranasal sinuses. No mastoid effusion. Other: None. MRI CERVICAL SPINE  FINDINGS Alignment: Straightening of lordosis. Minimal grade 1 C6-7 and C7-T1 anterolisthesis. Vertebrae: Normal bone marrow signal intensity. No focal osseous lesion. Congenital spinal canal narrowing. Cord: Normal signal and morphology. Posterior Fossa, vertebral arteries: Please see MRI head. Disc levels: Multilevel desiccation and mild disc space loss. C2-3: Bilateral uncovertebral and facet hypertrophy. Patent spinal canal and neural foramen. C3-4: Central protrusion abutting the ventral cord with uncovertebral and facet hypertrophy. Mild spinal canal and left neural foraminal narrowing. Patent right neural foramen. C4-5: Disc osteophyte complex with superimposed central protrusion, uncovertebral and bilateral facet hypertrophy. Mild spinal canal and bilateral neural foraminal narrowing. C5-6: Small central protrusion. Uncovertebral and bilateral facet hypertrophy. Mild spinal canal and moderate bilateral neural foraminal narrowing. C6-7: Disc osteophyte complex with superimposed right foraminal protrusion. Right predominant uncovertebral and facet hypertrophy. Mild spinal canal, moderate right and mild left neural foraminal narrowing. C7-T1: No significant disc bulge. Uncovertebral and facet hypertrophy. Patent spinal canal and right neural foramen. Mild left neural foraminal narrowing. Paraspinal tissues: No acute finding. IMPRESSION: MRI HEAD: Acute left hippocampal tail infarct. Minimal chronic microvascular ischemic changes. These results were called by telephone at the time of interpretation on 02/05/2020 at 4:08 pm to provider Dr. Hyacinth MeekerMiller, Who verbally acknowledged these results. MRI CERVICAL SPINE: Congenital spinal canal narrowing with superimposed spondylosis. No acute finding within the cervical spine. Mild C4-7 spinal canal narrowing. Moderate bilateral C5-6 and right C6-7 neural foraminal narrowing. Electronically Signed   By: Stana Buntinghikanele  Emekauwa M.D.   On: 02/05/2020 16:08   MR Cervical Spine Wo  Contrast  Result Date: 02/05/2020 CLINICAL DATA:  Blurry vision, neuro deficit EXAM: MRI HEAD WITHOUT CONTRAST MRI CERVICAL SPINE WITHOUT CONTRAST TECHNIQUE: Multiplanar, multiecho pulse sequences of the brain and surrounding structures, and cervical spine, to include the craniocervical junction and cervicothoracic junction, were obtained without intravenous contrast. COMPARISON:  02/05/2020 CTA head and neck. FINDINGS: MRI HEAD FINDINGS Brain: Acute infarct involving the left hippocampal tail. No intracranial hemorrhage. Cerebral volume is within normal limits. Minimal chronic microvascular ischemic changes. No midline shift, ventriculomegaly or extra-axial fluid collection. No mass lesion. Vascular: Please see same day CTA head and neck. Skull and upper cervical spine: Normal marrow signal. Sinuses/Orbits: Normal orbits. Clear paranasal sinuses. No mastoid effusion. Other: None. MRI CERVICAL SPINE FINDINGS Alignment: Straightening of lordosis. Minimal grade 1 C6-7 and C7-T1 anterolisthesis. Vertebrae: Normal bone marrow signal intensity. No focal osseous lesion. Congenital spinal canal narrowing. Cord: Normal signal and morphology. Posterior Fossa, vertebral arteries: Please see MRI head. Disc levels: Multilevel desiccation and mild disc  space loss. C2-3: Bilateral uncovertebral and facet hypertrophy. Patent spinal canal and neural foramen. C3-4: Central protrusion abutting the ventral cord with uncovertebral and facet hypertrophy. Mild spinal canal and left neural foraminal narrowing. Patent right neural foramen. C4-5: Disc osteophyte complex with superimposed central protrusion, uncovertebral and bilateral facet hypertrophy. Mild spinal canal and bilateral neural foraminal narrowing. C5-6: Small central protrusion. Uncovertebral and bilateral facet hypertrophy. Mild spinal canal and moderate bilateral neural foraminal narrowing. C6-7: Disc osteophyte complex with superimposed right foraminal protrusion. Right  predominant uncovertebral and facet hypertrophy. Mild spinal canal, moderate right and mild left neural foraminal narrowing. C7-T1: No significant disc bulge. Uncovertebral and facet hypertrophy. Patent spinal canal and right neural foramen. Mild left neural foraminal narrowing. Paraspinal tissues: No acute finding. IMPRESSION: MRI HEAD: Acute left hippocampal tail infarct. Minimal chronic microvascular ischemic changes. These results were called by telephone at the time of interpretation on 02/05/2020 at 4:08 pm to provider Dr. Hyacinth Meeker, Who verbally acknowledged these results. MRI CERVICAL SPINE: Congenital spinal canal narrowing with superimposed spondylosis. No acute finding within the cervical spine. Mild C4-7 spinal canal narrowing. Moderate bilateral C5-6 and right C6-7 neural foraminal narrowing. Electronically Signed   By: Stana Bunting M.D.   On: 02/05/2020 16:08    EKG: Independently reviewed.  NSR with rate 72; nonspecific ST changes with no evidence of acute ischemia   Labs on Admission: I have personally reviewed the available labs and imaging studies at the time of the admission.  Pertinent labs:   Glucose 171 Normal CBC INR 1.1 Normal UA UDS negative ETOH <10   Assessment/Plan Principal Problem:   Acute CVA (cerebrovascular accident) (HCC) Active Problems:   Anxiety    CVA -Very healthy and fit 64yo without FH presenting with blurred vision and other neurologic complaints -MRI confirms acute infarct in the hippocampal tail -Will admit for further evaluation -Telemetry monitoring; consider loop recorder  -No apparent large vessel occlusion on CTA -Echo -Risk stratification with FLP, A1c; will also check TSH  -He has been taking ASA daily and so has failed ASA therapy; likely needs short-term DAPT followed by transition to Plavix -Neurology consult -PT/OT/ST/Nutrition Consults -Allow permissive HTN for now; treat BP only if >220/120, and then with goal of 15%  reduction   HLD -Check FLP - LDL in 09/2017 was 84 and new goal is <70 -Will start Lipitor 40 mg daily empirically for now   Anxiety -Continue Lexapro     Note: This patient has been tested and is pending for the novel coronavirus COVID-19.     DVT prophylaxis:  Lovenox  Code Status: Full - confirmed with patient Family Communication: None present Disposition Plan:  The patient is from: home  Anticipated d/c is to: home without [facility] services  Anticipated d/c date will depend on clinical response to treatment, but possibly as early as tomorrow if he has excellent response to treatment  Patient is currently: acutely ill Consults called: Neurology; PT/OT/ST/Nutrition  Admission status: Admit - It is my clinical opinion that admission to INPATIENT is reasonable and necessary because of the expectation that this patient will require hospital care that crosses at least 2 midnights to treat this condition based on the medical complexity of the problems presented.  Given the aforementioned information, the predictability of an adverse outcome is felt to be significant.    Jonah Blue MD [facility]   How to contact the [facility] Attending or Consulting provider 7A - 7P or covering provider during after hours 7P -7A,  for this patient?  1. Check the care team in [facility] and look for a) attending/consulting [facility] provider listed and b) the [facility] team listed 2. Log into [email or web address] and use [facility] universal password to access. If you do not have the password, please contact the hospital operator. 3. Locate the [facility] provider you are looking for under [facility] and page to a number that you can be directly reached. 4. If you still have difficulty reaching the provider, please page the [facility] (Director on Call) for the Hospitalists listed on amion for assistance.   02/05/2020, 5:20 PM

## 2020-02-06 ENCOUNTER — Encounter (HOSPITAL_COMMUNITY): Payer: Self-pay | Admitting: Internal Medicine

## 2020-02-06 ENCOUNTER — Inpatient Hospital Stay (HOSPITAL_COMMUNITY): Payer: 59

## 2020-02-06 DIAGNOSIS — I6389 Other cerebral infarction: Secondary | ICD-10-CM

## 2020-02-06 DIAGNOSIS — I639 Cerebral infarction, unspecified: Secondary | ICD-10-CM

## 2020-02-06 LAB — LIPID PANEL
Cholesterol: 139 mg/dL (ref 0–200)
HDL: 44 mg/dL (ref >40)
LDL Cholesterol: 77 mg/dL (ref 0–99)
Total CHOL/HDL Ratio: 3.2 RATIO
Triglycerides: 88 mg/dL (ref <150)
VLDL: 18 mg/dL (ref 0–40)

## 2020-02-06 LAB — ECHOCARDIOGRAM COMPLETE
Area-P 1/2: 2.42 cm2
Height: 73 in
S' Lateral: 2.7 cm
Weight: 3040 oz

## 2020-02-06 LAB — TSH: TSH: 1.586 u[IU]/mL (ref 0.350–4.500)

## 2020-02-06 LAB — HIV ANTIBODY (ROUTINE TESTING W REFLEX): HIV Screen 4th Generation wRfx: NONREACTIVE

## 2020-02-06 LAB — HEMOGLOBIN A1C
Hgb A1c MFr Bld: 5.6 % (ref 4.8–5.6)
Mean Plasma Glucose: 114.02 mg/dL

## 2020-02-06 IMAGING — MR MR HEAD W/ CM
3 series · 10 of 48 positions shown · IV contrast (Y GAD)
Comparison: Brain MRI [DATE], CT angiogram head/neck
[DATE].

CLINICAL DATA: Stroke, follow-up; memory loss.

EXAM:
MRI HEAD WITH CONTRAST
TECHNIQUE: Multiplanar, multiecho pulse sequences of the brain and surrounding
structures were obtained with intravenous contrast.
CONTRAST:  8mL GADAVIST GADOBUTROL 1 MMOL/ML IV SOLN

[Series 2: T1 · axial · 3.0mm · 0.94mm/px · z∈[-89,+30]mm · 3 of 56 slices shown (1 of 2)]
[im 8/56]
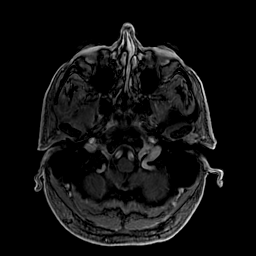
[im 29/56]
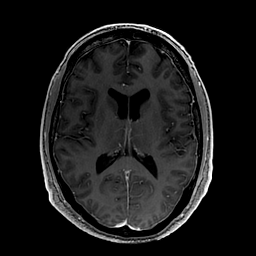
[im 48/56]
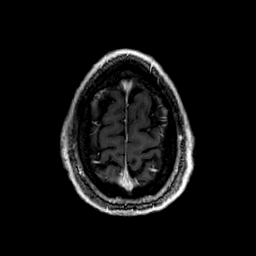

[Series 3: T1 · coronal · 5.0mm · 0.43mm/px · 3 of 32 slices shown (2 of 2)]
[im 6/32]
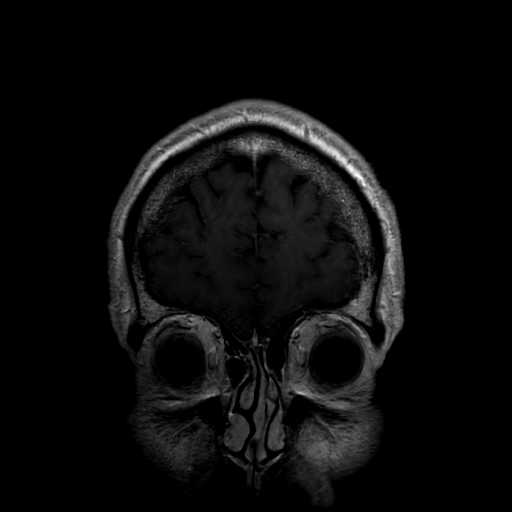
[im 16/32]
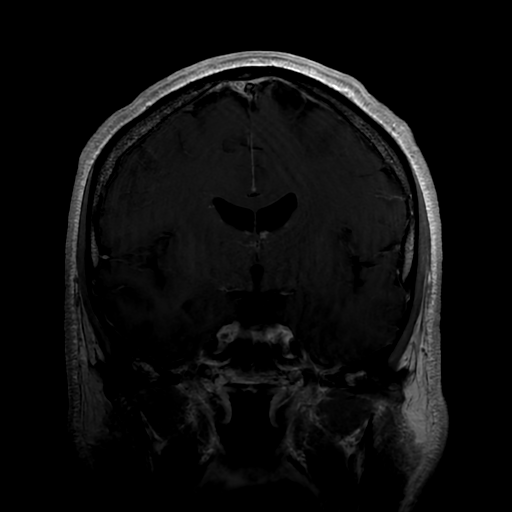
[im 26/32]
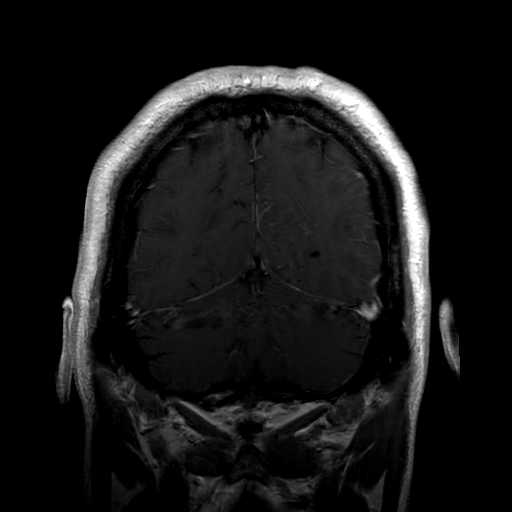

[Series 4: T2 post-contrast · coronal · 5.0mm · 0.20mm/px · 4 of 32 slices shown]
[im 1/32]
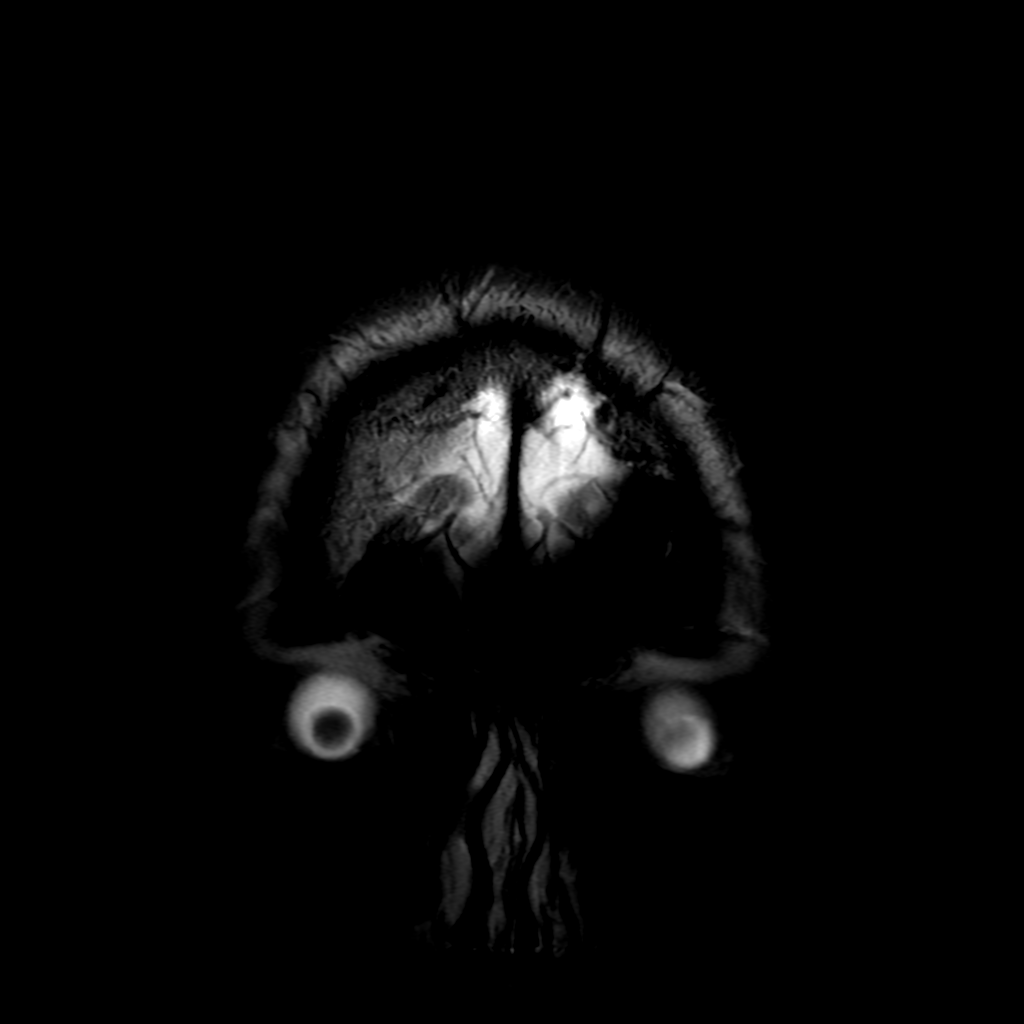
[im 6/32]
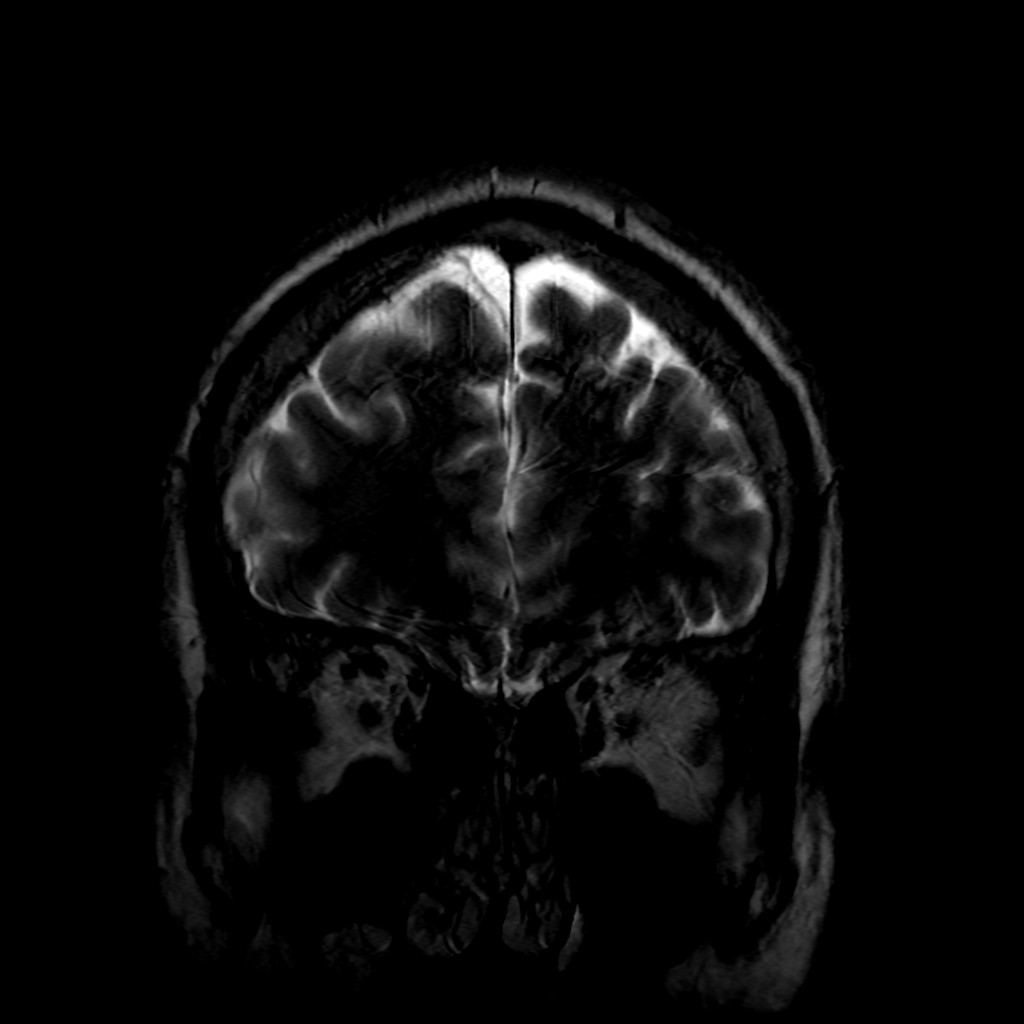
[im 16/32]
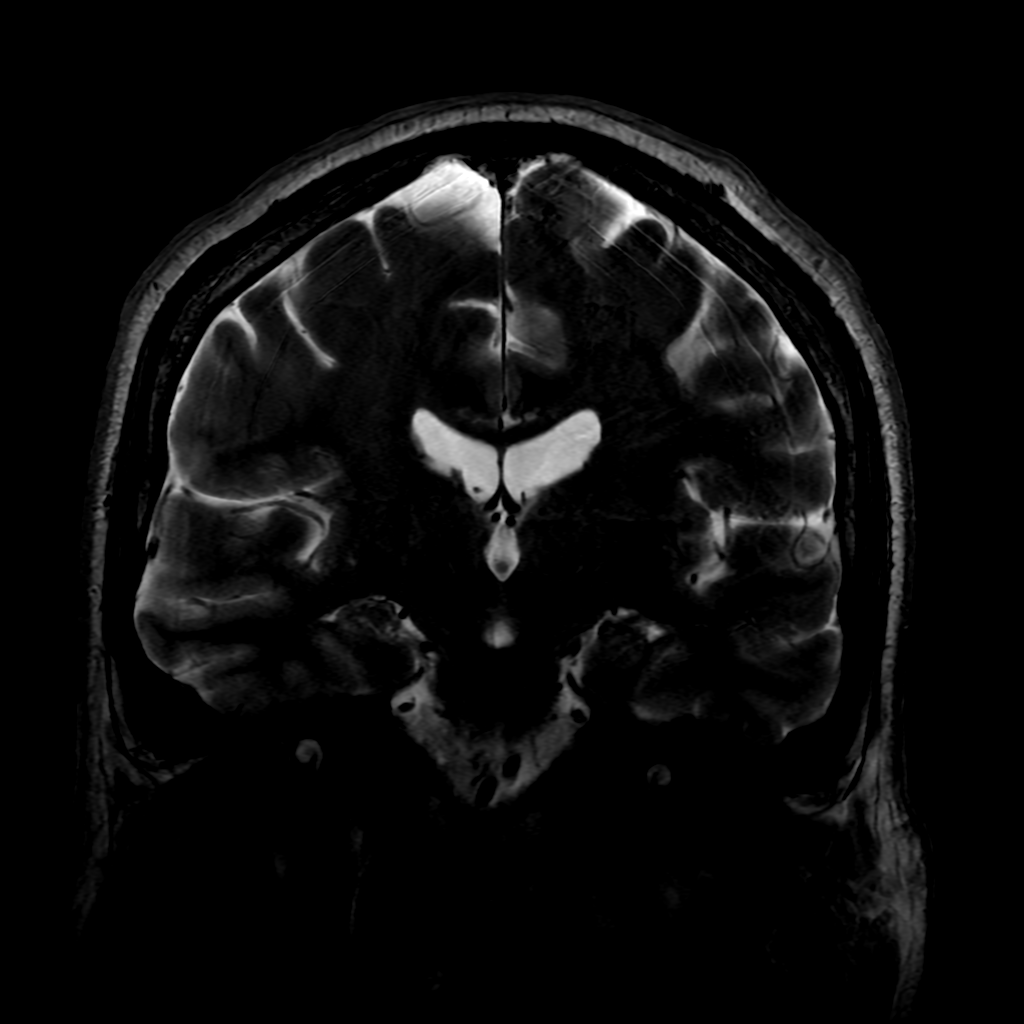
[im 26/32]
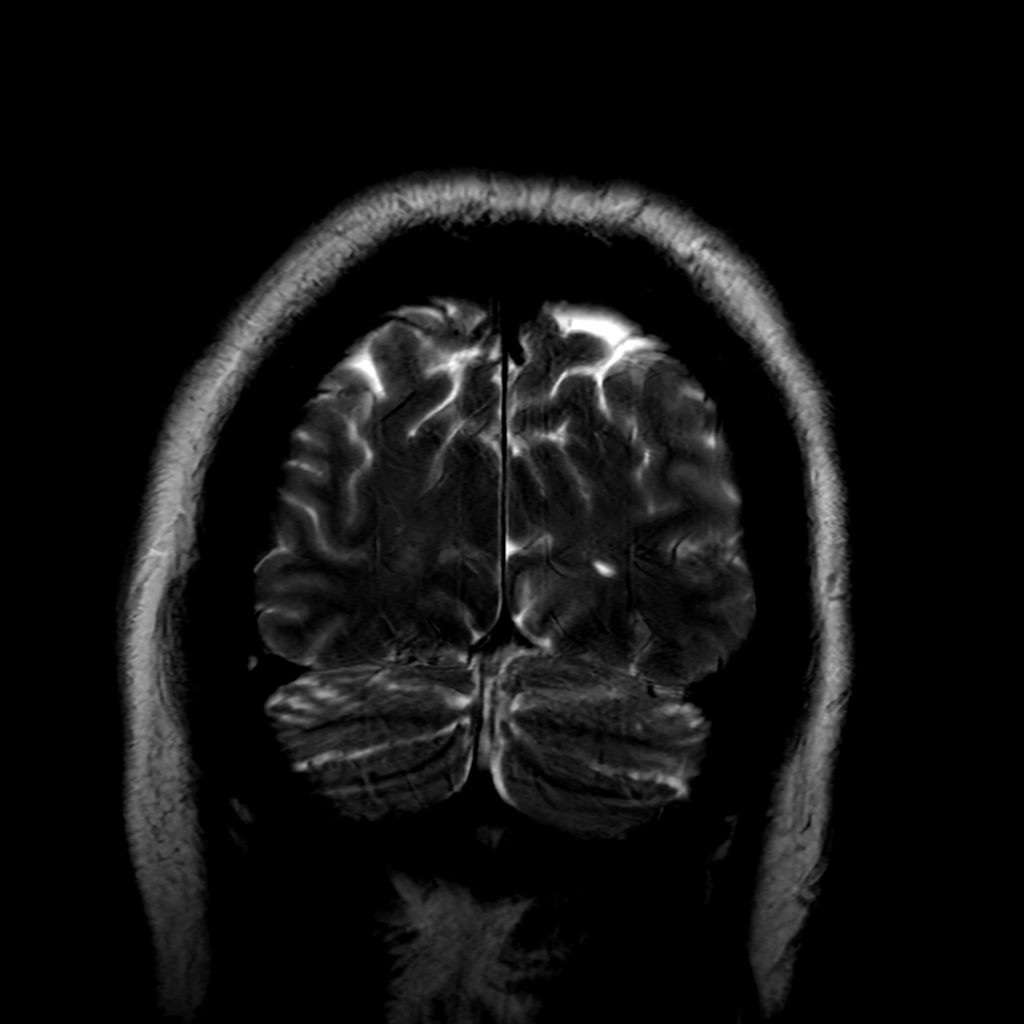

[10 of 48 positions shown; findings below may reference images not displayed]

FINDINGS: Brain:

A limited post-contrast brain MRI protocol was performed as a
follow-up to the noncontrast brain MRI of [DATE]. Only axial and
coronal T1 weighted postcontrast sequences as well as a coronal T2
weighted sequence were obtained.

Unchanged T2 hyperintensity and swelling of the left hippocampal
tail. There is no appreciable abnormal enhancement at this site.

There is faint enhancement within the midline pons. SWI signal loss
is noted at this site on the prior brain MRI of [DATE] and this
likely is related to an underlying vascular malformation (i.e.
capillary telangiectasia).

No abnormal intracranial enhancement is identified elsewhere.

Vascular: No unexpected finding.

Skull and upper cervical spine: No abnormal enhancement is
identified within the calvarium or visualized upper cervical spine.

Sinuses/Orbits: No abnormal orbital enhancement.
IMPRESSION: Unchanged T2 hyperintensity and swelling of the left hippocampal
tail without corresponding abnormal enhancement. Homogeneous
restricted diffusion was present at this site on the brain MRI of
[DATE] and this likely reflects an acute infarct. However, 4-6
week MRI follow-up should be considered to exclude alternative
etiologies (i.e. hippocampal mass).

Faint enhancement within the midline pons as described, suspected
capillary telangiectasia.

## 2020-02-06 MED ORDER — GADOBUTROL 1 MMOL/ML IV SOLN
8.0000 mL | Freq: Once | INTRAVENOUS | Status: AC | PRN
  Administered 2020-02-06: 8 mL via INTRAVENOUS

## 2020-02-06 MED ORDER — CLOPIDOGREL BISULFATE 75 MG PO TABS
75.0000 mg | ORAL_TABLET | Freq: Every day | ORAL | Status: DC
  Administered 2020-02-06 – 2020-02-07 (×2): 75 mg via ORAL
  Filled 2020-02-06 (×2): qty 1

## 2020-02-06 MED ORDER — ASPIRIN EC 81 MG PO TBEC
81.0000 mg | DELAYED_RELEASE_TABLET | Freq: Every day | ORAL | Status: DC
  Administered 2020-02-06 – 2020-02-07 (×2): 81 mg via ORAL
  Filled 2020-02-06 (×2): qty 1

## 2020-02-06 MED ORDER — SODIUM CHLORIDE 0.9 % IV SOLN: INTRAVENOUS | Status: DC

## 2020-02-06 MED ORDER — LORAZEPAM 2 MG/ML IJ SOLN
1.0000 mg | Freq: Once | INTRAMUSCULAR | Status: AC
  Administered 2020-02-06: 1 mg via INTRAVENOUS
  Filled 2020-02-06: qty 1

## 2020-02-06 NOTE — Progress Notes (Signed)
TCD bubble study completed with Dr. Pearlean Brownie. Refer to "CV Proc" under chart review to view preliminary results.  02/06/2020 4:23 PM Eula Fried., MHA, RVT, RDCS, RDMS

## 2020-02-06 NOTE — Progress Notes (Signed)
  Echocardiogram 2D Echocardiogram has been performed.  Jeffery Willis 02/06/2020, 11:14 AM

## 2020-02-06 NOTE — Evaluation (Signed)
Occupational Therapy Evaluation Patient Details Name: Jeffery Willis MRN: [id number] DOB: 04/21/1956 Today's Date: 02/06/2020    History of Present Illness 64 y.o. male with no pertinent risk factor in Phx presents as a transfer from [facility] for concern of stroke (sudden onset of blurry vision and dizziness on Wednesday, episode lasted approx 1 hour and self-improved with residual deficits). MRI brain Acute L hippocampal tail infarct. Per Neurologist, R homonymous superior quadrantanopia.    Clinical Impression   Pt was independent prior to admission, works as a Teacher, early years/pre. He has chronic dizziness and is prone to motion sickness. Pt presents with headache, dizziness with nausea and visual field deficit. He is overall functioning at a supervision level. Will follow acutely. Do not anticipate pt will need post acute OT.    Follow Up Recommendations  No OT follow up    Equipment Recommendations  None recommended by OT    Recommendations for Other Services       Precautions / Restrictions Precautions Precaution Comments: pt with chronic dizziness, denies falls Restrictions Weight Bearing Restrictions: No      Mobility Bed Mobility Overal bed mobility: Independent                Transfers Overall transfer level: Needs assistance   Transfers: Sit to/from Stand;Stand Pivot Transfers Sit to Stand: Supervision Stand pivot transfers: Supervision       General transfer comment: supervision for safety due to dizziness, pt moves tentatively    Balance Overall balance assessment: Needs assistance   Sitting balance-Leahy Scale: Good       Standing balance-Leahy Scale: Good                             ADL either performed or assessed with clinical judgement   ADL                                         General ADL Comments: Overall functioning at a set up to supervision level due to dizziness.     Vision Baseline  Vision/History: Wears glasses Wears Glasses: At all times Patient Visual Report: No change from baseline Vision Assessment?: Yes Visual Fields: Right superior homonymous quadranopsia     Perception Perception Comments: educated pt in functional considerations related to visual field impairment and instructed to avoid driving until MD releases   Praxis      Pertinent Vitals/Pain Pain Assessment: 0-10 Pain Score: 4  Pain Location: head Pain Descriptors / Indicators: Aching     Hand Dominance Right   Extremity/Trunk Assessment Upper Extremity Assessment Upper Extremity Assessment: Overall WFL for tasks assessed   Lower Extremity Assessment Lower Extremity Assessment: Defer to PT evaluation   Cervical / Trunk Assessment Cervical / Trunk Assessment: Normal   Communication Communication Communication: No difficulties   Cognition Arousal/Alertness: Awake/alert Behavior During Therapy: WFL for tasks assessed/performed Overall Cognitive Status: Impaired/Different from baseline Area of Impairment: Memory                     Memory: Decreased short-term memory             General Comments       Exercises     Shoulder Instructions      Home Living Family/patient expects to be discharged to:: Private residence Living Arrangements: Spouse/significant other Available Help at Discharge:  Family;Available PRN/intermittently Type of Home: House Home Access: Stairs to enter [employer] of Steps: 7 Entrance Stairs-Rails: None Home Layout: Two level Alternate Level Stairs-Number of Steps: flight   Bathroom Shower/Tub: Producer, television/film/video: Standard     Home Equipment: None      Lives With: Spouse    Prior Functioning/Environment Level of Independence: Independent        Comments: works as a Teacher, early years/pre, jogs        OT Problem List: Impaired balance (sitting and/or standing);Pain;Decreased cognition      OT  Treatment/Interventions: Self-care/ADL training;Patient/family education;Balance training;Therapeutic activities;Cognitive remediation/compensation    OT Goals(Current goals can be found in the care plan section) Acute Rehab OT Goals Patient Stated Goal: return to prior level of function OT Goal Formulation: With patient Time For Goal Achievement: 02/13/20 Potential to Achieve Goals: Good ADL Goals Pt Will Transfer to Toilet: with modified independence;ambulating;regular height toilet Pt Will Perform Tub/Shower Transfer: with modified independence;ambulating;Shower transfer Additional ADL Goal #1: Pt will state 3 compensatory strategies for short term memory impairment. Additional ADL Goal #2: Pt will use compensatory strategies of visual stabilization and slowing pace to manage dizziness. Additional ADL Goal #3: Pt will verbalize awareness of vision changes and functional implications.  OT Frequency: Min 2X/week   Barriers to D/C:            Co-evaluation              AM-PAC OT "6 Clicks" Daily Activity     Outcome Measure Help from another person eating meals?: None Help from another person taking care of personal grooming?: A Little Help from another person toileting, which includes using toliet, bedpan, or urinal?: A Little Help from another person bathing (including washing, rinsing, drying)?: A Little Help from another person to put on and taking off regular upper body clothing?: None Help from another person to put on and taking off regular lower body clothing?: A Little 6 Click Score: 20   End of Session    Activity Tolerance: Patient tolerated treatment well Patient left: in chair;with call bell/phone within reach;with family/visitor present  OT Visit Diagnosis: Dizziness and giddiness (R42);Low vision, both eyes (H54.2);Other symptoms and signs involving cognitive function                Time: 1521-1550 OT Time Calculation (min): 29 min Charges:  OT General  Charges $OT Visit: 1 Visit OT Evaluation $OT Eval Low Complexity: 1 Low OT Treatments $Self Care/Home Management : 8-22 mins  Martie Round, OTR/L Acute Rehabilitation Services Pager: [phone] Office: [phone]  Evern Bio 02/06/2020, 4:17 PM

## 2020-02-06 NOTE — Progress Notes (Signed)
    [facility] has been requested to perform a transesophageal echocardiogram on Jeffery Willis for stroke.  After careful review of history and examination, the risks and benefits of transesophageal echocardiogram have been explained including risks of esophageal damage, perforation (1:10,000 risk), bleeding, pharyngeal hematoma as well as other potential complications associated with conscious sedation including aspiration, arrhythmia, respiratory failure and death. Alternatives to treatment were discussed, questions were answered. Patient is willing to proceed.   Pt is scheduled with Dr. Ercole Georg Salvia 02/07/20 at 2:30 pm. NPO at [place].   Jeffery Willis, [place]  02/06/2020 3:28 PM

## 2020-02-06 NOTE — Progress Notes (Signed)
PROGRESS NOTE    Jeffery Willis  BJY:[id number] DOB: 1955/09/23 DOA: 02/05/2020 PCP: Joaquim Nam, MD   Brief Narrative:   HPI: Jeffery Willis is a 64 y.o. male with medical history significant of anxiety presenting with acute onset of blurred vision last night.  The patient reports that he is very healthy, only takes Lexapro daily and ASA 81 mg (his wife suggested it about a year ago and he takes daily).  He went the to [facility] on Wednesday and felt quite dizzy, but they gave him some glucose and he felt better.  About 830 last night (9/25), he developed acutely abnormal vision.  It was blurry but also appeared to be in different planes, and it was very scary.  He was unable to do anything with his vision so altered, and it lasted for about 5 minutes that bad and then improved but never normalized.  He had some ataxia as well as L temporoparietal and eye pain.  He also noticed B arm tingling and his left leg "felt different."  He had significant difficulty focusing his vision.  He went to [facility] this AM for evaluation.  Head CT was negative and he was transferred to the [facility] ER for MRI.  Currently, he still has L temporoparietal pain and L eye pain with ongoing difficulty focusing his vision.   ED Course: [facility] for blurred vision.  Sent here for MRI which shows acute hippocampal CVA. Neurology to see.  Given ASA.  Assessment & Plan:   Principal Problem:   Acute CVA (cerebrovascular accident) [facility]) Active Problems:   Anxiety   Acute ischemic CVA/hyperlipidemia: MRI brain with and without contrast confirms acute ischemic infarct of the tail of the left hippocampal area.  Radiology recommends repeating MRI in 4 to 6 weeks to rule out hippocampal mass.  Currently, only symptom that patient has is right-sided vision loss.  No other complaint.  Neuro exam is completely normal.  Neurology on board and they have arranged for this patient to have TEE in the morning as they are suspecting atrial  fibrillation as the possible etiology.  Patient remains on DAPT and atorvastatin.  Appreciate neurology help and defer management to them.  Anxiety: Continue Lexapro.  DVT prophylaxis: enoxaparin (LOVENOX) injection 40 mg Start: 02/05/20 1715   Code Status: Full Code  Family Communication: Wife present at bedside.  Plan of care discussed with patient and his wife in length and he verbalized understanding and agreed with it.  Status is: Inpatient  Remains inpatient appropriate because:Inpatient level of care appropriate due to severity of illness   Dispo: The patient is from: Home              Anticipated d/c is to: Home              Anticipated d/c date is: 1 day              Patient currently is not medically stable to d/c.        Estimated body mass index is 25.07 kg/m as calculated from the following:   Height as of this encounter:  (1.854 m).   Weight as of this encounter: 86.2 kg.      Nutritional status:               Consultants:   Neurology  Procedures:   None  Antimicrobials:  Anti-infectives (From admission, onward)   None         Subjective: Patient seen and examined.  Wife at the bedside.  Complains of vision loss in the right eye.  No other complaint.  Objective: Vitals:   02/06/20 0300 02/06/20 0520 02/06/20 0730 02/06/20 1313  BP: 122/79  129/84 136/80  Pulse: (!) 57  70 (!) 59  Resp: 13  16 18   Temp:    98.1 F (36.7 C)  TempSrc:    Oral  SpO2: 97%  100% 98%  Weight:  86.2 kg    Height:  6\' 1"  (1.854 m)     No intake or output data in the 24 hours ending 02/06/20 1420 Filed Weights   02/05/20 1015 02/06/20 0520  Weight: 86.2 kg 86.2 kg    Examination:  General exam: Appears calm and comfortable  Respiratory system: Clear to auscultation. Respiratory effort normal. Cardiovascular system: S1 & S2 heard, RRR. No JVD, murmurs, rubs, gallops or clicks. No pedal edema. Gastrointestinal system: Abdomen is  nondistended, soft and nontender. No organomegaly or masses felt. Normal bowel sounds heard. Central nervous system: Alert and oriented. No focal neurological deficits.  Right hemianopia. Extremities: Symmetric 5 x 5 power. Skin: No rashes, lesions or ulcers Psychiatry: Judgement and insight appear normal. Mood & affect appropriate.    Data Reviewed: I have personally reviewed following labs and imaging studies  CBC: Recent Labs  Lab 02/05/20 1024  WBC 8.6  NEUTROABS 6.6  HGB 15.7  HCT 46.0  MCV 90.0  PLT 209   Basic Metabolic Panel: Recent Labs  Lab 02/05/20 1024  NA 138  K 3.8  CL 101  CO2 27  GLUCOSE 171*  BUN 21  CREATININE 1.06  CALCIUM 9.2   GFR: Estimated Creatinine Clearance: 80.6 mL/min (by C-G formula based on SCr of 1.06 mg/dL). Liver Function Tests: No results for input(s): AST, ALT, ALKPHOS, BILITOT, PROT, ALBUMIN in the last 168 hours. No results for input(s): LIPASE, AMYLASE in the last 168 hours. No results for input(s): AMMONIA in the last 168 hours. Coagulation Profile: Recent Labs  Lab 02/05/20 1024  INR 1.1   Cardiac Enzymes: No results for input(s): CKTOTAL, CKMB, CKMBINDEX, TROPONINI in the last 168 hours. BNP (last 3 results) No results for input(s): PROBNP in the last 8760 hours. HbA1C: Recent Labs    02/06/20 0513  HGBA1C 5.6   CBG: Recent Labs  Lab 02/05/20 1015  GLUCAP 189*   Lipid Profile: Recent Labs    02/06/20 0513  CHOL 139  HDL 44  LDLCALC 77  TRIG 88  CHOLHDL 3.2   Thyroid Function Tests: Recent Labs    02/06/20 0513  TSH 1.586   Anemia Panel: No results for input(s): VITAMINB12, FOLATE, FERRITIN, TIBC, IRON, RETICCTPCT in the last 72 hours. Sepsis Labs: No results for input(s): PROCALCITON, LATICACIDVEN in the last 168 hours.  Recent Results (from the past 240 hour(s))  Respiratory Panel by RT PCR (Flu A&B, Covid) - Nasopharyngeal Swab     Status: None   Collection Time: 02/05/20  6:00 PM    Specimen: Nasopharyngeal Swab  Result Value Ref Range Status   SARS Coronavirus 2 by RT PCR NEGATIVE NEGATIVE Final    Comment: (NOTE) SARS-CoV-2 target nucleic acids are NOT DETECTED.  The SARS-CoV-2 RNA is generally detectable in upper respiratoy specimens during the acute phase of infection. The lowest concentration of SARS-CoV-2 viral copies this assay can detect is 131 copies/mL. A negative result does not preclude SARS-Cov-2 infection and should not be used as the sole basis for treatment or other patient management decisions. A negative  result may occur with  improper specimen collection/handling, submission of specimen other than nasopharyngeal swab, presence of viral mutation(s) within the areas targeted by this assay, and inadequate number of viral copies (<131 copies/mL). A negative result must be combined with clinical observations, patient history, and epidemiological information. The expected result is Negative.  Fact Sheet for Patients:  https://www.moore.com/https://www.fda.gov/media/142436/download  Fact Sheet for Healthcare Providers:  https://www.young.biz/https://www.fda.gov/media/142435/download  This test is no t yet approved or cleared by the [place] FDA and  has been authorized for detection and/or diagnosis of SARS-CoV-2 by FDA under an Emergency Use Authorization (EUA). This EUA will remain  in effect (meaning this test can be used) for the duration of the COVID-19 declaration under Section 564(b)(1) of the Act, 21 U.S.C. section 360bbb-3(b)(1), unless the authorization is terminated or revoked sooner.     Influenza A by PCR NEGATIVE NEGATIVE Final   Influenza B by PCR NEGATIVE NEGATIVE Final    Comment: (NOTE) The Xpert Xpress SARS-CoV-2/FLU/RSV assay is intended as an aid in  the diagnosis of influenza from Nasopharyngeal swab specimens and  should not be used as a sole basis for treatment. Nasal washings and  aspirates are unacceptable for Xpert Xpress SARS-CoV-2/FLU/RSV   testing.  Fact Sheet for Patients: https://www.moore.com/https://www.fda.gov/media/142436/download  Fact Sheet for Healthcare Providers: https://www.young.biz/https://www.fda.gov/media/142435/download  This test is not yet approved or cleared by the [place] FDA and  has been authorized for detection and/or diagnosis of SARS-CoV-2 by  FDA under an Emergency Use Authorization (EUA). This EUA will remain  in effect (meaning this test can be used) for the duration of the  Covid-19 declaration under Section 564(b)(1) of the Act, 21  U.S.C. section 360bbb-3(b)(1), unless the authorization is  terminated or revoked. Performed at [facility], [address] [place], [place] [address]       Radiology Studies: CT Angio Head W or Wo Contrast  Result Date: 02/05/2020 CLINICAL DATA:  Transient ischemic attack. Left-sided arm weakness and blurry vision/dizziness. EXAM: CT ANGIOGRAPHY HEAD AND NECK TECHNIQUE: Multidetector CT imaging of the head and neck was performed using the standard protocol during bolus administration of intravenous contrast. Multiplanar CT image reconstructions and MIPs were obtained to evaluate the vascular anatomy. Carotid stenosis measurements (when applicable) are obtained utilizing NASCET criteria, using the distal internal carotid diameter as the denominator. CONTRAST:  100mL OMNIPAQUE IOHEXOL 350 MG/ML SOLN COMPARISON:  Concurrent head CT. FINDINGS: CT HEAD FINDINGS Brain: No evidence of acute large vascular territory infarction, hemorrhage, hydrocephalus, extra-axial collection or mass lesion/mass effect. Skull: Normal. Negative for fracture or focal lesion. Sinuses: Right maxillary sinus retention cyst. Otherwise, imaged portions are clear. Orbits: No acute finding. Review of the MIP images confirms the above findings CTA NECK FINDINGS Aortic arch: Imaged portion shows no evidence of aneurysm or dissection. No significant stenosis of the major arch vessel origins. Right carotid system: No evidence of  dissection, stenosis (50% or greater) or occlusion. Left carotid system: No evidence of dissection, stenosis (50% or greater) or occlusion. Vertebral arteries: Mild multifocal narrowing of the right vertebral artery secondary to mass effect from adjacent bony osteophytes. No evidence of significant (greater than 50%) arterial stenosis or occlusion. Skeleton: Multilevel degenerative change with degenerative anterolisthesis of C6 on C7. Other neck: No acute abnormality. Upper chest: No acute abnormality. Review of the MIP images confirms the above findings CTA HEAD FINDINGS Anterior circulation: No significant stenosis, proximal occlusion, aneurysm, or vascular malformation. Posterior circulation: No significant stenosis, proximal occlusion, aneurysm, or vascular  malformation. Mild left PCA narrowing. Venous sinuses: As permitted by contrast timing, patent. Review of the MIP images confirms the above findings IMPRESSION: 1. No evidence of acute intracranial abnormality. 2. No significant (greater than 50%) arterial stenosis or occlusion in the head or neck. Electronically Signed   By: Feliberto Harts MD   On: 02/05/2020 11:53   CT Angio Neck W and/or Wo Contrast  Result Date: 02/05/2020 CLINICAL DATA:  Transient ischemic attack. Left-sided arm weakness and blurry vision/dizziness. EXAM: CT ANGIOGRAPHY HEAD AND NECK TECHNIQUE: Multidetector CT imaging of the head and neck was performed using the standard protocol during bolus administration of intravenous contrast. Multiplanar CT image reconstructions and MIPs were obtained to evaluate the vascular anatomy. Carotid stenosis measurements (when applicable) are obtained utilizing NASCET criteria, using the distal internal carotid diameter as the denominator. CONTRAST:  OMNIPAQUE IOHEXOL 350 MG/ML SOLN COMPARISON:  Concurrent head CT. FINDINGS: CT HEAD FINDINGS Brain: No evidence of acute large vascular territory infarction, hemorrhage, hydrocephalus,  extra-axial collection or mass lesion/mass effect. Skull: Normal. Negative for fracture or focal lesion. Sinuses: Right maxillary sinus retention cyst. Otherwise, imaged portions are clear. Orbits: No acute finding. Review of the MIP images confirms the above findings CTA NECK FINDINGS Aortic arch: Imaged portion shows no evidence of aneurysm or dissection. No significant stenosis of the major arch vessel origins. Right carotid system: No evidence of dissection, stenosis (50% or greater) or occlusion. Left carotid system: No evidence of dissection, stenosis (50% or greater) or occlusion. Vertebral arteries: Mild multifocal narrowing of the right vertebral artery secondary to mass effect from adjacent bony osteophytes. No evidence of significant (greater than 50%) arterial stenosis or occlusion. Skeleton: Multilevel degenerative change with degenerative anterolisthesis of C6 on C7. Other neck: No acute abnormality. Upper chest: No acute abnormality. Review of the MIP images confirms the above findings CTA HEAD FINDINGS Anterior circulation: No significant stenosis, proximal occlusion, aneurysm, or vascular malformation. Posterior circulation: No significant stenosis, proximal occlusion, aneurysm, or vascular malformation. Mild left PCA narrowing. Venous sinuses: As permitted by contrast timing, patent. Review of the MIP images confirms the above findings IMPRESSION: 1. No evidence of acute intracranial abnormality. 2. No significant (greater than 50%) arterial stenosis or occlusion in the head or neck. Electronically Signed   By: Feliberto Harts MD   On: 02/05/2020 11:53   MR BRAIN WO CONTRAST  Result Date: 02/05/2020 CLINICAL DATA:  Blurry vision, neuro deficit EXAM: MRI HEAD WITHOUT CONTRAST MRI CERVICAL SPINE WITHOUT CONTRAST TECHNIQUE: Multiplanar, multiecho pulse sequences of the brain and surrounding structures, and cervical spine, to include the craniocervical junction and cervicothoracic junction, were  obtained without intravenous contrast. COMPARISON:  02/05/2020 CTA head and neck. FINDINGS: MRI HEAD FINDINGS Brain: Acute infarct involving the left hippocampal tail. No intracranial hemorrhage. Cerebral volume is within normal limits. Minimal chronic microvascular ischemic changes. No midline shift, ventriculomegaly or extra-axial fluid collection. No mass lesion. Vascular: Please see same day CTA head and neck. Skull and upper cervical spine: Normal marrow signal. Sinuses/Orbits: Normal orbits. Clear paranasal sinuses. No mastoid effusion. Other: None. MRI CERVICAL SPINE FINDINGS Alignment: Straightening of lordosis. Minimal grade 1 C6-7 and C7-T1 anterolisthesis. Vertebrae: Normal bone marrow signal intensity. No focal osseous lesion. Congenital spinal canal narrowing. Cord: Normal signal and morphology. Posterior Fossa, vertebral arteries: Please see MRI head. Disc levels: Multilevel desiccation and mild disc space loss. C2-3: Bilateral uncovertebral and facet hypertrophy. Patent spinal canal and neural foramen. C3-4: Central protrusion abutting the ventral cord with uncovertebral  and facet hypertrophy. Mild spinal canal and left neural foraminal narrowing. Patent right neural foramen. C4-5: Disc osteophyte complex with superimposed central protrusion, uncovertebral and bilateral facet hypertrophy. Mild spinal canal and bilateral neural foraminal narrowing. C5-6: Small central protrusion. Uncovertebral and bilateral facet hypertrophy. Mild spinal canal and moderate bilateral neural foraminal narrowing. C6-7: Disc osteophyte complex with superimposed right foraminal protrusion. Right predominant uncovertebral and facet hypertrophy. Mild spinal canal, moderate right and mild left neural foraminal narrowing. C7-T1: No significant disc bulge. Uncovertebral and facet hypertrophy. Patent spinal canal and right neural foramen. Mild left neural foraminal narrowing. Paraspinal tissues: No acute finding. IMPRESSION: MRI  HEAD: Acute left hippocampal tail infarct. Minimal chronic microvascular ischemic changes. These results were called by telephone at the time of interpretation on 02/05/2020 at 4:08 pm to provider Dr. Hyacinth Meeker, Who verbally acknowledged these results. MRI CERVICAL SPINE: Congenital spinal canal narrowing with superimposed spondylosis. No acute finding within the cervical spine. Mild C4-7 spinal canal narrowing. Moderate bilateral C5-6 and right C6-7 neural foraminal narrowing. Electronically Signed   By: Stana Bunting M.D.   On: 02/05/2020 16:08   MR BRAIN W CONTRAST  Result Date: 02/06/2020 CLINICAL DATA:  Stroke, follow-up; memory loss. EXAM: MRI HEAD WITH CONTRAST TECHNIQUE: Multiplanar, multiecho pulse sequences of the brain and surrounding structures were obtained with intravenous contrast. CONTRAST:  69mL GADAVIST GADOBUTROL 1 MMOL/ML IV SOLN COMPARISON:  Brain MRI 02/05/2020, CT angiogram head/neck 02/05/2020. FINDINGS: Brain: A limited post-contrast brain MRI protocol was performed as a follow-up to the noncontrast brain MRI of 02/05/2020. Only axial and coronal T1 weighted postcontrast sequences as well as a coronal T2 weighted sequence were obtained. Unchanged T2 hyperintensity and swelling of the left hippocampal tail. There is no appreciable abnormal enhancement at this site. There is faint enhancement within the midline pons. SWI signal loss is noted at this site on the prior brain MRI of 02/05/2020 and this likely is related to an underlying vascular malformation (i.e. capillary telangiectasia). No abnormal intracranial enhancement is identified elsewhere. Vascular: No unexpected finding. Skull and upper cervical spine: No abnormal enhancement is identified within the calvarium or visualized upper cervical spine. Sinuses/Orbits: No abnormal orbital enhancement. IMPRESSION: Unchanged T2 hyperintensity and swelling of the left hippocampal tail without corresponding abnormal enhancement. Homogeneous  restricted diffusion was present at this site on the brain MRI of 02/05/2020 and this likely reflects an acute infarct. However, 4-6 week MRI follow-up should be considered to exclude alternative etiologies (i.e. hippocampal mass). 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 Electronically Signed   By: Jackey Loge DO   On: 02/06/2020 13:26   MR Cervical Spine Wo Contrast  Result Date: 02/05/2020 CLINICAL DATA:  Blurry vision, neuro deficit EXAM: MRI HEAD WITHOUT CONTRAST MRI CERVICAL SPINE WITHOUT CONTRAST TECHNIQUE: Multiplanar, multiecho pulse sequences of the brain and surrounding structures, and cervical spine, to include the craniocervical junction and cervicothoracic junction, were obtained without intravenous contrast. COMPARISON:  02/05/2020 CTA head and neck. FINDINGS: MRI HEAD FINDINGS Brain: Acute infarct involving the left hippocampal tail. No intracranial hemorrhage. Cerebral volume is within normal limits. Minimal chronic microvascular ischemic changes. No midline shift, ventriculomegaly or extra-axial fluid collection. No mass lesion. Vascular: Please see same day CTA head and neck. Skull and upper cervical spine: Normal marrow signal. Sinuses/Orbits: Normal orbits. Clear paranasal sinuses. No mastoid effusion. Other: None. MRI CERVICAL SPINE FINDINGS Alignment: Straightening of lordosis. Minimal grade 1 C6-7 and C7-T1 anterolisthesis. Vertebrae: Normal bone marrow signal intensity. No focal  osseous lesion. Congenital spinal canal narrowing. Cord: Normal signal and morphology. Posterior Fossa, vertebral arteries: Please see MRI head. Disc levels: Multilevel desiccation and mild disc space loss. C2-3: Bilateral uncovertebral and facet hypertrophy. Patent spinal canal and neural foramen. C3-4: Central protrusion abutting the ventral cord with uncovertebral and facet hypertrophy. Mild spinal canal and left neural foraminal narrowing. Patent right  neural foramen. C4-5: Disc osteophyte complex with superimposed central protrusion, uncovertebral and bilateral facet hypertrophy. Mild spinal canal and bilateral neural foraminal narrowing. C5-6: Small central protrusion. Uncovertebral and bilateral facet hypertrophy. Mild spinal canal and moderate bilateral neural foraminal narrowing. C6-7: Disc osteophyte complex with superimposed right foraminal protrusion. Right predominant uncovertebral and facet hypertrophy. Mild spinal canal, moderate right and mild left neural foraminal narrowing. C7-T1: No significant disc bulge. Uncovertebral and facet hypertrophy. Patent spinal canal and right neural foramen. Mild left neural foraminal narrowing. Paraspinal tissues: No acute finding. IMPRESSION: MRI HEAD: Acute left hippocampal tail infarct. Minimal chronic microvascular ischemic changes. These results were called by telephone at the time of interpretation on 02/05/2020 at 4:08 pm to provider Dr. Hyacinth Meeker, Who verbally acknowledged these results. MRI CERVICAL SPINE: Congenital spinal canal narrowing with superimposed spondylosis. No acute finding within the cervical spine. Mild C4-7 spinal canal narrowing. Moderate bilateral C5-6 and right C6-7 neural foraminal narrowing. Electronically Signed   By: Stana Bunting M.D.   On: 02/05/2020 16:08   ECHOCARDIOGRAM COMPLETE  Result Date: 02/06/2020    ECHOCARDIOGRAM REPORT   Patient Name:   Marsalis Giancola Date of Exam: 02/06/2020 Medical Rec #:  [id number]       Height:       73.0 in Accession #:    [id number]      Weight:       190.0 lb Date of Birth:  18-Aug-1955       BSA:          2.105 m Patient Age:    64 years        BP:           122/79 mmHg Patient Gender: M               HR:           59 bpm. Exam Location:  Inpatient Procedure: 2D Echo Indications:    434.91 stroke  History:        Patient has no prior history of Echocardiogram examinations. No                 prior cardiac hx on file.  Sonographer:    Celene Skeen RDCS (AE) Referring Phys: [id number] JENNIFER YATES IMPRESSIONS  1. Left ventricular ejection fraction, by estimation, is 65 to 70%. The left ventricle has normal function. The left ventricle has no regional wall motion abnormalities. There is mild concentric left ventricular hypertrophy. Left ventricular diastolic parameters were normal.  2. Right ventricular systolic function is normal. The right ventricular size is normal.  3. The mitral valve is normal in structure. No evidence of mitral valve regurgitation. No evidence of mitral stenosis.  4. The aortic valve is normal in structure. Aortic valve regurgitation is not visualized. No aortic stenosis is present.  5. The inferior vena cava is normal in size with greater than 50% respiratory variability, suggesting right atrial pressure of 3 mmHg. Comparison(s): No prior Echocardiogram. Conclusion(s)/Recommendation(s): Normal biventricular function without evidence of hemodynamically significant valvular heart disease. No intracardiac source of embolism detected on this transthoracic study. A transesophageal echocardiogram is recommended  to exclude cardiac source of embolism if clinically indicated. FINDINGS  Left Ventricle: Left ventricular ejection fraction, by estimation, is 65 to 70%. The left ventricle has normal function. The left ventricle has no regional wall motion abnormalities. The left ventricular internal cavity size was normal in size. There is  mild concentric left ventricular hypertrophy. Left ventricular diastolic parameters were normal. Right Ventricle: The right ventricular size is normal. No increase in right ventricular wall thickness. Right ventricular systolic function is normal. Left Atrium: Left atrial size was normal in size. Right Atrium: Right atrial size was normal in size. Pericardium: There is no evidence of pericardial effusion. Mitral Valve: The mitral valve is normal in structure. No evidence of mitral valve regurgitation. No  evidence of mitral valve stenosis. Tricuspid Valve: The tricuspid valve is normal in structure. Tricuspid valve regurgitation is not demonstrated. No evidence of tricuspid stenosis. Aortic Valve: The aortic valve is normal in structure. Aortic valve regurgitation is not visualized. No aortic stenosis is present. Pulmonic Valve: The pulmonic valve was normal in structure. Pulmonic valve regurgitation is not visualized. No evidence of pulmonic stenosis. Aorta: The aortic root is normal in size and structure. Venous: The inferior vena cava is normal in size with greater than 50% respiratory variability, suggesting right atrial pressure of 3 mmHg. IAS/Shunts: No atrial level shunt detected by color flow Doppler.  LEFT VENTRICLE PLAX 2D LVIDd:         4.90 cm  Diastology LVIDs:         2.70 cm  LV e' medial:    6.09 cm/s LV PW:         1.10 cm  LV E/e' medial:  12.8 LV IVS:        1.10 cm  LV e' lateral:   13.10 cm/s LVOT diam:     2.10 cm  LV E/e' lateral: 6.0 LV SV:         46 LV SV Index:   22 LVOT Area:     3.46 cm  LEFT ATRIUM             Index LA diam:        3.20 cm 1.52 cm/m LA Vol (A2C):   42.9 ml 20.38 ml/m LA Vol (A4C):   24.2 ml 11.50 ml/m LA Biplane Vol: 33.1 ml 15.72 ml/m  AORTIC VALVE LVOT Vmax:   72.50 cm/s LVOT Vmean:  50.500 cm/s LVOT VTI:    0.132 m  AORTA Ao Root diam: 3.20 cm MITRAL VALVE MV Area (PHT): 2.42 cm    SHUNTS MV Decel Time: 313 msec    Systemic VTI:  0.13 m MV E velocity: 78.00 cm/s  Systemic Diam: 2.10 cm MV A velocity: 61.70 cm/s MV E/A ratio:  1.26 Tobias Alexander MD Electronically signed by Tobias Alexander MD Signature Date/Time: 02/06/2020/1:13:44 PM    Final     Scheduled Meds: .  stroke: mapping our early stages of recovery book   Does not apply Once  . aspirin EC  81 mg Oral Daily  . atorvastatin  40 mg Oral Daily  . clopidogrel  75 mg Oral Daily  . enoxaparin (LOVENOX) injection  40 mg Subcutaneous Q24H  . escitalopram  10 mg Oral Daily   Continuous Infusions: .  sodium chloride 50 mL/hr at 02/06/20 1401     LOS: 1 day   Time spent: 34 minutes   Hughie Closs, MD [facility]  02/06/2020, 2:20 PM   To contact the attending provider between 7A-7P  or the covering provider during after hours 7P-7A, please log into the web site [email or web address].

## 2020-02-06 NOTE — Evaluation (Signed)
Speech Language Pathology Evaluation Patient Details Name: Jeffery Willis MRN: [id number] DOB: 06/17/1955 Today's Date: 02/06/2020 Time: 6834-1962 SLP Time Calculation (min) (ACUTE ONLY): 23 min  Problem List:  Patient Active Problem List   Diagnosis Date Noted  . Acute CVA (cerebrovascular accident) (HCC) 02/05/2020  . Anxiety   . Health care maintenance 10/08/2017  . Achilles tendonitis 10/08/2017  . Advance care planning 10/08/2017  . Loss of weight 02/07/2016  . Anxiety state 02/07/2016  . Skin lesion 02/07/2016  . Routine general medical examination at a health care facility 03/05/2011  . RENAL CALCULUS 02/14/2010  . SKIN LESION 02/14/2010   Past Medical History:  Past Medical History:  Diagnosis Date  . Anxiety   . History of cardiovascular stress test 2/07   normal myocardial perfusion without evidence of infarction of ischemia.  Given these  results and tthe patient's persistence of flipped T waves, I think these are likely asymmptomatic and  clinically insignificant findings  . Renal calculus    per [facility]  . Skin lesion    Past Surgical History:  Past Surgical History:  Procedure Laterality Date  . 2 D Echo  06/2005   normal LV function and no LVH.  Normal 2D echowith ER60 to 70%  . KIDNEY STONE SURGERY     HPI:  Jeffery Willis is a 64 y.o. male with medical history significant of anxiety presenting with acute onset of blurred vision last night. No other complaints other than right-sided vision loss. Head CT was negative and MRI brain with and without contrast revealed acute ischemic infarct of the tail of the left hippocampal area.     Assessment / Plan / Recommendation Clinical Impression  Pt was cooperative and attentive during SLE. The SLUMS was administered and the pt scored 24/30, indicating mild cognitive impairment as characterized by memory deficits. His memory is characterized by a storage deficit and decreased recall of new information. All other  areas of cognition were WNL for the tasks assessed. Educated pt and family on internal and external memory strategies to utilize at home. Recommend pt f/u with outpatient SLP.    SLP Assessment  SLP Recommendation/Assessment: All further Speech Lanaguage Pathology  needs can be addressed in the next venue of care SLP Visit Diagnosis: Cognitive communication deficit (R41.841)    Follow Up Recommendations  Outpatient SLP    Frequency and Duration           SLP Evaluation Cognition  Overall Cognitive Status: Impaired/Different from baseline Arousal/Alertness: Awake/alert Orientation Level: Oriented X4 Attention: Focused;Sustained Focused Attention: Appears intact Sustained Attention: Appears intact Memory: Impaired Memory Impairment: Storage deficit;Decreased recall of new information Awareness: Appears intact Problem Solving: Appears intact Executive Function: Organizing;Sequencing Sequencing: Appears intact Organizing: Appears intact Safety/Judgment: Appears intact       Comprehension  Auditory Comprehension Overall Auditory Comprehension: Appears within functional limits for tasks assessed Yes/No Questions: Not tested Commands: Within Functional Limits Conversation: Simple    Expression Expression Primary Mode of Expression: Verbal Verbal Expression Overall Verbal Expression: Appears within functional limits for tasks assessed Initiation: No impairment Automatic Speech: Name;Social Response;Day of week;Month of year Level of Generative/Spontaneous Verbalization: Word;Phrase;Sentence;Conversation Written Expression Dominant Hand: Right Written Expression: Within Functional Limits   Oral / Motor  Motor Speech Overall Motor Speech: Appears within functional limits for tasks assessed Respiration: Within functional limits Phonation: Normal Resonance: Within functional limits Articulation: Within functional limitis Intelligibility: Intelligible Motor Planning: Witnin  functional limits Motor Speech Errors: Not applicable   GO  [name] 02/06/2020, 2:40 PM

## 2020-02-06 NOTE — Progress Notes (Signed)
STROKE TEAM PROGRESS NOTE   INTERVAL HISTORY His wife is at the bedside.  She used to work at [employer] and now works for Dr. Myna Hidalgo. Patient remains in the ED, up in the chair at the bedside. Patient had episode at the gym on Wed when he had blurred vision in both eyes, no HA, lasted 5-10 mins.  He recovered completely after this.  No palpitations, felt like he was going to faint with a quiver in his heart. He had not had before. He was fine until Sat evening when he stood up and complained of double vision. Lasted about 30 mins and improved but did not resolve completely. Sun woke w/ HA and bilateral numbness and tingling. Still w/ HA now, he feels his vision is ok. He is struggling with memory. Wife reports he has been having trouble with his memory for past several weeks and was pushing for neuro testing PTA. No hx DVT, PE, recent long car rides/travel. On aspirin daily.  MRI scan of the brain shows left hippocampal hyperintensity on DWI she is also noticed on the FLAIR and T2 images likely represents acute stroke.  CT angiogram of the brain and neck showed no significant large vessel stenosis or occlusion.         Vitals:   02/05/20 1255 02/05/20 2330 02/06/20 0300 02/06/20 0520  BP: 138/74 136/76 122/79   Pulse: 67 65 (!) 57   Resp: Temp: 98 F (36.7 C)     TempSrc: Oral     SpO2: 100% 95% 97%   Weight:    86.2 kg  Height:     (1.854 m)   CBC:  Recent Labs  Lab 02/05/20 1024  WBC 8.6  NEUTROABS 6.6  HGB 15.7  HCT 46.0  MCV 90.0  PLT 209   Basic Metabolic Panel:  Recent Labs  Lab 02/05/20 1024  NA 138  K 3.8  CL 101  CO2 27  GLUCOSE 171*  BUN 21  CREATININE 1.06  CALCIUM 9.2   Lipid Panel:  Recent Labs  Lab 02/06/20 0513  CHOL 139  TRIG 88  HDL 44  CHOLHDL 3.2  VLDL 18  LDLCALC 77   HgbA1c:  Recent Labs  Lab 02/06/20 0513  HGBA1C 5.6   Urine Drug Screen:  Recent Labs  Lab 02/05/20 1110  LABOPIA NONE DETECTED  COCAINSCRNUR NONE DETECTED   LABBENZ NONE DETECTED  AMPHETMU NONE DETECTED  THCU NONE DETECTED  LABBARB NONE DETECTED    Alcohol Level  Recent Labs  Lab 02/05/20 1024  ETH <10    IMAGING past 24 hours CT Angio Head W or Wo Contrast  Result Date: 02/05/2020 CLINICAL DATA:  Transient ischemic attack. Left-sided arm weakness and blurry vision/dizziness. EXAM: CT ANGIOGRAPHY HEAD AND NECK TECHNIQUE: Multidetector CT imaging of the head and neck was performed using the standard protocol during bolus administration of intravenous contrast. Multiplanar CT image reconstructions and MIPs were obtained to evaluate the vascular anatomy. Carotid stenosis measurements (when applicable) are obtained utilizing NASCET criteria, using the distal internal carotid diameter as the denominator. CONTRAST:  OMNIPAQUE IOHEXOL 350 MG/ML SOLN COMPARISON:  Concurrent head CT. FINDINGS: CT HEAD FINDINGS Brain: No evidence of acute large vascular territory infarction, hemorrhage, hydrocephalus, extra-axial collection or mass lesion/mass effect. Skull: Normal. Negative for fracture or focal lesion. Sinuses: Right maxillary sinus retention cyst. Otherwise, imaged portions are clear. Orbits: No acute finding. Review of the MIP images confirms the above findings CTA  NECK FINDINGS Aortic arch: Imaged portion shows no evidence of aneurysm or dissection. No significant stenosis of the major arch vessel origins. Right carotid system: No evidence of dissection, stenosis (50% or greater) or occlusion. Left carotid system: No evidence of dissection, stenosis (50% or greater) or occlusion. Vertebral arteries: Mild multifocal narrowing of the right vertebral artery secondary to mass effect from adjacent bony osteophytes. No evidence of significant (greater than 50%) arterial stenosis or occlusion. Skeleton: Multilevel degenerative change with degenerative anterolisthesis of C6 on C7. Other neck: No acute abnormality. Upper chest: No acute abnormality. Review of  the MIP images confirms the above findings CTA HEAD FINDINGS Anterior circulation: No significant stenosis, proximal occlusion, aneurysm, or vascular malformation. Posterior circulation: No significant stenosis, proximal occlusion, aneurysm, or vascular malformation. Mild left PCA narrowing. Venous sinuses: As permitted by contrast timing, patent. Review of the MIP images confirms the above findings IMPRESSION: 1. No evidence of acute intracranial abnormality. 2. No significant (greater than 50%) arterial stenosis or occlusion in the head or neck. Electronically Signed   By: Feliberto Harts MD   On: 02/05/2020 11:53   CT Angio Neck W and/or Wo Contrast  Result Date: 02/05/2020 CLINICAL DATA:  Transient ischemic attack. Left-sided arm weakness and blurry vision/dizziness. EXAM: CT ANGIOGRAPHY HEAD AND NECK TECHNIQUE: Multidetector CT imaging of the head and neck was performed using the standard protocol during bolus administration of intravenous contrast. Multiplanar CT image reconstructions and MIPs were obtained to evaluate the vascular anatomy. Carotid stenosis measurements (when applicable) are obtained utilizing NASCET criteria, using the distal internal carotid diameter as the denominator. CONTRAST:  OMNIPAQUE IOHEXOL 350 MG/ML SOLN COMPARISON:  Concurrent head CT. FINDINGS: CT HEAD FINDINGS Brain: No evidence of acute large vascular territory infarction, hemorrhage, hydrocephalus, extra-axial collection or mass lesion/mass effect. Skull: Normal. Negative for fracture or focal lesion. Sinuses: Right maxillary sinus retention cyst. Otherwise, imaged portions are clear. Orbits: No acute finding. Review of the MIP images confirms the above findings CTA NECK FINDINGS Aortic arch: Imaged portion shows no evidence of aneurysm or dissection. No significant stenosis of the major arch vessel origins. Right carotid system: No evidence of dissection, stenosis (50% or greater) or occlusion. Left carotid system:  No evidence of dissection, stenosis (50% or greater) or occlusion. Vertebral arteries: Mild multifocal narrowing of the right vertebral artery secondary to mass effect from adjacent bony osteophytes. No evidence of significant (greater than 50%) arterial stenosis or occlusion. Skeleton: Multilevel degenerative change with degenerative anterolisthesis of C6 on C7. Other neck: No acute abnormality. Upper chest: No acute abnormality. Review of the MIP images confirms the above findings CTA HEAD FINDINGS Anterior circulation: No significant stenosis, proximal occlusion, aneurysm, or vascular malformation. Posterior circulation: No significant stenosis, proximal occlusion, aneurysm, or vascular malformation. Mild left PCA narrowing. Venous sinuses: As permitted by contrast timing, patent. Review of the MIP images confirms the above findings IMPRESSION: 1. No evidence of acute intracranial abnormality. 2. No significant (greater than 50%) arterial stenosis or occlusion in the head or neck. Electronically Signed   By: Feliberto Harts MD   On: 02/05/2020 11:53   MR BRAIN WO CONTRAST  Result Date: 02/05/2020 CLINICAL DATA:  Blurry vision, neuro deficit EXAM: MRI HEAD WITHOUT CONTRAST MRI CERVICAL SPINE WITHOUT CONTRAST TECHNIQUE: Multiplanar, multiecho pulse sequences of the brain and surrounding structures, and cervical spine, to include the craniocervical junction and cervicothoracic junction, were obtained without intravenous contrast. COMPARISON:  02/05/2020 CTA head and neck. FINDINGS: MRI HEAD FINDINGS Brain: Acute  infarct involving the left hippocampal tail. No intracranial hemorrhage. Cerebral volume is within normal limits. Minimal chronic microvascular ischemic changes. No midline shift, ventriculomegaly or extra-axial fluid collection. No mass lesion. Vascular: Please see same day CTA head and neck. Skull and upper cervical spine: Normal marrow signal. Sinuses/Orbits: Normal orbits. Clear paranasal sinuses.  No mastoid effusion. Other: None. MRI CERVICAL SPINE FINDINGS Alignment: Straightening of lordosis. Minimal grade 1 C6-7 and C7-T1 anterolisthesis. Vertebrae: Normal bone marrow signal intensity. No focal osseous lesion. Congenital spinal canal narrowing. Cord: Normal signal and morphology. Posterior Fossa, vertebral arteries: Please see MRI head. Disc levels: Multilevel desiccation and mild disc space loss. C2-3: Bilateral uncovertebral and facet hypertrophy. Patent spinal canal and neural foramen. C3-4: Central protrusion abutting the ventral cord with uncovertebral and facet hypertrophy. Mild spinal canal and left neural foraminal narrowing. Patent right neural foramen. C4-5: Disc osteophyte complex with superimposed central protrusion, uncovertebral and bilateral facet hypertrophy. Mild spinal canal and bilateral neural foraminal narrowing. C5-6: Small central protrusion. Uncovertebral and bilateral facet hypertrophy. Mild spinal canal and moderate bilateral neural foraminal narrowing. C6-7: Disc osteophyte complex with superimposed right foraminal protrusion. Right predominant uncovertebral and facet hypertrophy. Mild spinal canal, moderate right and mild left neural foraminal narrowing. C7-T1: No significant disc bulge. Uncovertebral and facet hypertrophy. Patent spinal canal and right neural foramen. Mild left neural foraminal narrowing. Paraspinal tissues: No acute finding. IMPRESSION: MRI HEAD: Acute left hippocampal tail infarct. Minimal chronic microvascular ischemic changes. These results were called by telephone at the time of interpretation on 02/05/2020 at 4:08 pm to provider Dr. Hyacinth Meeker, Who verbally acknowledged these results. MRI CERVICAL SPINE: Congenital spinal canal narrowing with superimposed spondylosis. No acute finding within the cervical spine. Mild C4-7 spinal canal narrowing. Moderate bilateral C5-6 and right C6-7 neural foraminal narrowing. Electronically Signed   By: Stana Bunting  M.D.   On: 02/05/2020 16:08   MR Cervical Spine Wo Contrast  Result Date: 02/05/2020 CLINICAL DATA:  Blurry vision, neuro deficit EXAM: MRI HEAD WITHOUT CONTRAST MRI CERVICAL SPINE WITHOUT CONTRAST TECHNIQUE: Multiplanar, multiecho pulse sequences of the brain and surrounding structures, and cervical spine, to include the craniocervical junction and cervicothoracic junction, were obtained without intravenous contrast. COMPARISON:  02/05/2020 CTA head and neck. FINDINGS: MRI HEAD FINDINGS Brain: Acute infarct involving the left hippocampal tail. No intracranial hemorrhage. Cerebral volume is within normal limits. Minimal chronic microvascular ischemic changes. No midline shift, ventriculomegaly or extra-axial fluid collection. No mass lesion. Vascular: Please see same day CTA head and neck. Skull and upper cervical spine: Normal marrow signal. Sinuses/Orbits: Normal orbits. Clear paranasal sinuses. No mastoid effusion. Other: None. MRI CERVICAL SPINE FINDINGS Alignment: Straightening of lordosis. Minimal grade 1 C6-7 and C7-T1 anterolisthesis. Vertebrae: Normal bone marrow signal intensity. No focal osseous lesion. Congenital spinal canal narrowing. Cord: Normal signal and morphology. Posterior Fossa, vertebral arteries: Please see MRI head. Disc levels: Multilevel desiccation and mild disc space loss. C2-3: Bilateral uncovertebral and facet hypertrophy. Patent spinal canal and neural foramen. C3-4: Central protrusion abutting the ventral cord with uncovertebral and facet hypertrophy. Mild spinal canal and left neural foraminal narrowing. Patent right neural foramen. C4-5: Disc osteophyte complex with superimposed central protrusion, uncovertebral and bilateral facet hypertrophy. Mild spinal canal and bilateral neural foraminal narrowing. C5-6: Small central protrusion. Uncovertebral and bilateral facet hypertrophy. Mild spinal canal and moderate bilateral neural foraminal narrowing. C6-7: Disc osteophyte complex  with superimposed right foraminal protrusion. Right predominant uncovertebral and facet hypertrophy. Mild spinal canal, moderate right and mild left neural  foraminal narrowing. C7-T1: No significant disc bulge. Uncovertebral and facet hypertrophy. Patent spinal canal and right neural foramen. Mild left neural foraminal narrowing. Paraspinal tissues: No acute finding. IMPRESSION: MRI HEAD: Acute left hippocampal tail infarct. Minimal chronic microvascular ischemic changes. These results were called by telephone at the time of interpretation on 02/05/2020 at 4:08 pm to provider Dr. Hyacinth Meeker, Who verbally acknowledged these results. MRI CERVICAL SPINE: Congenital spinal canal narrowing with superimposed spondylosis. No acute finding within the cervical spine. Mild C4-7 spinal canal narrowing. Moderate bilateral C5-6 and right C6-7 neural foraminal narrowing. Electronically Signed   By: Stana Bunting M.D.   On: 02/05/2020 16:08    PHYSICAL EXAM Pleasant middle-age Caucasian male not in distress. . Afebrile. Head is nontraumatic. Neck is supple without bruit.    Cardiac exam no murmur or gallop. Lungs are clear to auscultation. Distal pulses are well felt. Neurological Exam ;  Awake  Alert oriented x 3. Normal speech and language.eye movements full without nystagmus.fundi were not visualized. Vision acuity   appears normal.  Right superior quadrantanopsia.  Hearing is normal. Palatal movements are normal. Face symmetric. Tongue midline. Normal strength, tone, reflexes and coordination. Normal sensation. Gait deferred.  NIH stroke scale 1 premorbid modified Rankin 0  ASSESSMENT/PLAN Jeffery Willis is a 64 y.o. male with no significant PMH presenting with blurry vision/dizziness and HA, tingling of LUE, concern for memory difficulties prior to admission.   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  MRI CS congenital narrowing w/ spondylosis. No acute finding.   Given memory difficulties, will Check MRI w/ constrast pending    2D Echo pending   TEE to look for embolic source. Arranged with [facility] for tomorrow at 1430 w/ Dr. Jani Files. (I have made patient NPO after midnight tonight).  If TEE negative, a  [facility] electrophysiologist will consult and consider placement of an implantable loop recorder to evaluate for atrial fibrillation as etiology of stroke. This has been explained to patient/family by Dr. Pearlean Brownie and they are agreeable.  TCD bubble pending    LDL 77  HgbA1c 5.6  VTE prophylaxis - Lovenox 40 mg sq daily   aspirin 81 mg daily prior to admission, now on aspirin 325 mg daily. 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 SLP assessment pending   Anxiety  Hospital day # 1 Patient presented with sudden onset of vision difficulties and dizziness likely due to embolic left PCA infarct.  Given the fact that he had some preexisting memory difficulties and the appearance of hyperintensity on T2 and flair within 24 hours is really unusual would recommend doing MRI brain with contrast to rule out any structural lesion like tumor.  Continue cardiac monitoring for A. fib and check TEE and loop recorder tomorrow.  Transplant Doppler bubble study for PFO.  Continue ongoing stroke work-up.  Aspirin Plavix for 3 weeks followed by aspirin alone.  Long discussion with  patient and wife and answered questions.  Patient may also benefit with consideration for possible participation in the [facility] axiomatic stroke prevention trial if interested.  He and wife were given information to review and decide.  Greater than 50% time during this 35-minute visit were spent on counseling and coordination of care about his embolic stroke and answering question about stroke prevention and treatment.  Discussed with Dr. Eual FinesPahvani Jerrald Doverspike, MD To contact Stroke Continuity provider, please refer to [email or web address]. After hours, contact General Neurology

## 2020-02-06 NOTE — Progress Notes (Signed)
PT Cancellation Note  Patient Details Name: Jeffery Willis MRN: [id number] DOB: 1955-11-29   Cancelled Treatment:    Reason Eval/Treat Not Completed: Patient at procedure or test/unavailable  Attempted to see x 2 this pm: pt with admissions nurse and finishing with OT. (And leaving for vascular study).   Briefly discussed his symptoms of vertigo (which have been long-standing) and the acute worsening. Patient currently not interested in OPPT for vestibular rehab. Will attempt to see 9/28 a.m. for full assessment.    Jeffery Willis, PT Pager [phone]   Zena Amos 02/06/2020, 4:32 PM

## 2020-02-06 NOTE — Plan of Care (Signed)

## 2020-02-06 NOTE — ED Notes (Signed)
Report given however patient is ready to go to MRI. Unable to take to floor until patient returns.

## 2020-02-07 ENCOUNTER — Telehealth: Payer: Self-pay | Admitting: Internal Medicine

## 2020-02-07 ENCOUNTER — Inpatient Hospital Stay (HOSPITAL_COMMUNITY): Payer: 59 | Admitting: Anesthesiology

## 2020-02-07 ENCOUNTER — Encounter (HOSPITAL_COMMUNITY)
Admission: EM | Disposition: A | Discharge status: Home / Self Care | Payer: Self-pay | Source: Home / Self Care | Attending: Family Medicine

## 2020-02-07 ENCOUNTER — Encounter (HOSPITAL_COMMUNITY): Payer: Self-pay | Admitting: Internal Medicine

## 2020-02-07 ENCOUNTER — Inpatient Hospital Stay (HOSPITAL_COMMUNITY): Payer: 59

## 2020-02-07 DIAGNOSIS — I639 Cerebral infarction, unspecified: Secondary | ICD-10-CM

## 2020-02-07 HISTORY — PX: LOOP RECORDER INSERTION: EP1214

## 2020-02-07 HISTORY — PX: BUBBLE STUDY: SHX6837

## 2020-02-07 HISTORY — PX: TEE WITHOUT CARDIOVERSION: SHX5443

## 2020-02-07 LAB — BASIC METABOLIC PANEL
Anion gap: 8 (ref 5–15)
BUN: 13 mg/dL (ref 8–23)
CO2: 27 mmol/L (ref 22–32)
Calcium: 8.9 mg/dL (ref 8.9–10.3)
Chloride: 103 mmol/L (ref 98–111)
Creatinine, Ser: 1.09 mg/dL (ref 0.61–1.24)
GFR calc Af Amer: >60 mL/min (ref >60)
GFR calc non Af Amer: >60 mL/min (ref >60)
Glucose, Bld: 105 mg/dL — ABNORMAL HIGH (ref 70–99)
Potassium: 4.3 mmol/L (ref 3.5–5.1)
Sodium: 138 mmol/L (ref 135–145)

## 2020-02-07 SURGERY — ECHOCARDIOGRAM, TRANSESOPHAGEAL
Anesthesia: Monitor Anesthesia Care

## 2020-02-07 SURGERY — LOOP RECORDER INSERTION

## 2020-02-07 MED ORDER — CLOPIDOGREL BISULFATE 75 MG PO TABS
75.0000 mg | ORAL_TABLET | Freq: Every day | ORAL | 0 refills | Status: DC
Start: 2020-02-07 — End: 2020-02-24

## 2020-02-07 MED ORDER — PROPOFOL 500 MG/50ML IV EMUL
INTRAVENOUS | Status: DC | PRN
  Administered 2020-02-07: 100 ug/kg/min via INTRAVENOUS

## 2020-02-07 MED ORDER — ATORVASTATIN CALCIUM 40 MG PO TABS: 40.0000 mg | ORAL_TABLET | Freq: Every day | ORAL | 0 refills | Status: DC

## 2020-02-07 MED ORDER — LIDOCAINE 2% (20 MG/ML) 5 ML SYRINGE
INTRAMUSCULAR | Status: DC | PRN
  Administered 2020-02-07: 100 mg via INTRAVENOUS

## 2020-02-07 MED ORDER — LIDOCAINE-EPINEPHRINE 1 %-1:100000 IJ SOLN
INTRAMUSCULAR | Status: DC | PRN
  Administered 2020-02-07: 10 mL

## 2020-02-07 MED ORDER — ADULT MULTIVITAMIN W/MINERALS CH
1.0000 | ORAL_TABLET | Freq: Every day | ORAL | Status: DC
  Administered 2020-02-07: 1 via ORAL
  Filled 2020-02-07: qty 1

## 2020-02-07 MED ORDER — BUTAMBEN-TETRACAINE-BENZOCAINE 2-2-14 % EX AERO
INHALATION_SPRAY | CUTANEOUS | Status: DC | PRN
  Administered 2020-02-07: 2 via TOPICAL

## 2020-02-07 MED ORDER — ENSURE ENLIVE PO LIQD: 237.0000 mL | Freq: Two times a day (BID) | ORAL | Status: DC

## 2020-02-07 MED ORDER — PROPOFOL 10 MG/ML IV BOLUS
INTRAVENOUS | Status: DC | PRN
  Administered 2020-02-07: 20 mg via INTRAVENOUS
  Administered 2020-02-07: 50 mg via INTRAVENOUS

## 2020-02-07 MED ORDER — LIDOCAINE-EPINEPHRINE 1 %-1:100000 IJ SOLN
INTRAMUSCULAR | Status: AC
  Filled 2020-02-07: qty 1

## 2020-02-07 MED FILL — ATORVASTATIN CALCIUM 40 MG: 40 | 30 days supply | Qty: 30 | Fill #0

## 2020-02-07 MED FILL — CLOPIDOGREL 75 MG TABLET: 75 | 30 days supply | Qty: 30 | Fill #0

## 2020-02-07 SURGICAL SUPPLY — 2 items
MONITOR REVEAL LINQ II (Prosthesis & Implant Heart) ×1 IMPLANT
PACK LOOP INSERTION (CUSTOM PROCEDURE TRAY) ×2 IMPLANT

## 2020-02-07 NOTE — CV Procedure (Signed)
Brief TEE Note  LVEF 60-65% No LA/LAA thrombus or masses No intra-atrial shunts by color flow Doppler or saline microcavitation study. Trivial TR, MR and PR.  For additional details see full report.  Jeffery Noreen C. Duke Salvia, MD, [facility]  02/07/2020 12:18 PM

## 2020-02-07 NOTE — Anesthesia Preprocedure Evaluation (Addendum)
Anesthesia Evaluation  Patient identified by MRN, date of birth, ID band Patient awake    Reviewed: Allergy & Precautions, NPO status , Patient's Chart, lab work & pertinent test results  Airway Mallampati: I  TM Distance: >3 FB Neck ROM: Full    Dental no notable dental hx.    Pulmonary neg pulmonary ROS,    Pulmonary exam normal breath sounds clear to auscultation       Cardiovascular negative cardio ROS Normal cardiovascular exam Rhythm:Regular Rate:Normal  ECHO 9/21 FINDINGS  Left Ventricle: Left ventricular ejection fraction, by estimation, is 65  to 70%. The left ventricle has normal function. The left ventricle has no  regional wall motion abnormalities. The left ventricular internal cavity  size was normal in size. There is  mild concentric left ventricular hypertrophy. Left ventricular diastolic  parameters were normal.    Neuro/Psych Anxiety CVA, No Residual Symptoms    GI/Hepatic negative GI ROS, Neg liver ROS,   Endo/Other  negative endocrine ROS  Renal/GU Renal disease  negative genitourinary   Musculoskeletal negative musculoskeletal ROS (+)   Abdominal   Peds negative pediatric ROS (+)  Hematology negative hematology ROS (+)   Anesthesia Other Findings   Reproductive/Obstetrics negative OB ROS                             Anesthesia Physical Anesthesia Plan  ASA: III  Anesthesia Plan: MAC   Post-op Pain Management:    Induction: Intravenous  PONV Risk Score and Plan:   Airway Management Planned: Mask and Natural Airway  Additional Equipment: None  Intra-op Plan:   Post-operative Plan:   Informed Consent:     Dental advisory given  Plan Discussed with: Anesthesiologist  Anesthesia Plan Comments:        Anesthesia Quick Evaluation

## 2020-02-07 NOTE — Discharge Summary (Signed)
Physician Discharge Summary  Jeffery Willis ZOX:[id number] DOB: 1955/06/28 DOA: 02/05/2020  PCP: Joaquim Nam, MD  Admit date: 02/05/2020 Discharge date: 02/07/2020  Admitted From: Home Disposition: Home  Recommendations for Outpatient Follow-up:  1. Follow up with PCP in 1-2 weeks 2. Follow with cardiology in 2 weeks 3. Follow-up with neurology in 6 weeks 4. Please obtain BMP/CBC in one week 5. Please follow up with your PCP on the following pending results: Unresulted Labs (From admission, onward)         None       Home Health: None Equipment/Devices: None  Discharge Condition: Stable CODE STATUS: Full code Diet recommendation: Cardiac  Subjective: Seen and examined this morning.  No new complaint.  Tells me that his vision has improved almost 90%.  Brief/Interim Summary: Jeffery Willis a 64 y.o.malewith medical history significant ofanxiety presented with acute onset of blurred vision.He went the to [facility] on Wednesday and felt quite dizzy, but they gave him some glucose and he felt better. About 830 last night (9/25), he developed acutely abnormal vision. It was blurry but also appeared to be in different planes/ He was unable to do anything with his vision so altered, and it lasted for about 5 minutes that bad and then improved but never normalized. He had some ataxia as well as L temporoparietal and eye pain. He also noticed bilateral arm tingling and his left leg "felt different." He had significant difficulty focusing his vision. He went to [facility] for evaluation. Head CT was negative and he was transferred to the [facility] ER for MRI and was admitted under hospitalist service.  Neurology was consulted.  MRI brain confirmed acute left hippocampal tail infarct.  Minimal chronic microvascular ischemic changes.  Patient was started on Plavix as well as aspirin was continued.  He was also started on atorvastatin 40 mg.  MRI brain with contrast was obtained to rule out mass.   Which did rule out mass and confirmed a stroke once again.  Patient was continuously monitored on telemetry and there was no evidence of arrhythmia.  Due to cryptogenic nature of the stroke, cardiology was consulted and patient underwent TEE which did not show any intracardiac thrombus or any other abnormality.  Loop recorder was placed.  Patient was evaluated by PT OT and PT recommended outpatient PT.  He was cleared by neurology.  Per my discussion with Dr. Pearlean Brownie who recommended discharging patient with aspirin and Plavix and then discontinuing aspirin after 3 weeks but continuing Plavix indefinitely.  He has personally discussed those instructions with patient and his wife.  I spoke to the wife who is in agreement with discharge.  I will also discharge this patient on atorvastatin 40 mg p.o. daily.  The only deficit this patient had was right more than nuclear hemianopsia.  His vision has improved but is still there is some deficit.  Neurology has personally advised this patient to limit his driving due to vision deficit.  Neurology has also restricted this patient to go to work until 1 week.  Patient will be provided a letter for that.  Discharge Diagnoses:  Principal Problem:   Acute CVA (cerebrovascular accident) [facility]) Active Problems:   Anxiety    Discharge Instructions  Discharge Instructions    Ambulatory referral to Physical Therapy   Complete by: As directed      Allergies as of 02/07/2020   No Known Allergies     Medication List    TAKE these medications   aspirin EC  81 MG tablet Take 81 mg by mouth daily. Swallow whole.   atorvastatin 40 MG tablet Commonly known as: LIPITOR Take 1 tablet (40 mg total) by mouth daily.   clopidogrel 75 MG tablet Commonly known as: PLAVIX Take 1 tablet (75 mg total) by mouth daily.   escitalopram 10 MG tablet Commonly known as: Lexapro Take 0.5 tablets (5 mg total) by mouth daily. What changed: how much to take       Follow-up  Information    [facility] Office Follow up.   Specialty: Cardiology Why: 02/14/2020 @ 8:30AM, wound check visit Contact information: [address] [place] [address] [phone]       Joaquim Nam, MD Follow up in 1 week(s).   Specialty: Family Medicine Contact information: [address] [place] [address] [phone]        Micki Riley, MD Follow up in 6 week(s).   Specialties: Neurology, Radiology Contact information: [address] [place] [address] [phone]              No Known Allergies  Consultations: Cardiology and neurology   Procedures/Studies: CT Angio Head W or Wo Contrast  Result Date: 02/05/2020 CLINICAL DATA:  Transient ischemic attack. Left-sided arm weakness and blurry vision/dizziness. EXAM: CT ANGIOGRAPHY HEAD AND NECK TECHNIQUE: Multidetector CT imaging of the head and neck was performed using the standard protocol during bolus administration of intravenous contrast. Multiplanar CT image reconstructions and MIPs were obtained to evaluate the vascular anatomy. Carotid stenosis measurements (when applicable) are obtained utilizing NASCET criteria, using the distal internal carotid diameter as the denominator. CONTRAST:  OMNIPAQUE IOHEXOL 350 MG/ML SOLN COMPARISON:  Concurrent head CT. FINDINGS: CT HEAD FINDINGS Brain: No evidence of acute large vascular territory infarction, hemorrhage, hydrocephalus, extra-axial collection or mass lesion/mass effect. Skull: Normal. Negative for fracture or focal lesion. Sinuses: Right maxillary sinus retention cyst. Otherwise, imaged portions are clear. Orbits: No acute finding. Review of the MIP images confirms the above findings CTA NECK FINDINGS Aortic arch: Imaged portion shows no evidence of aneurysm or dissection. No significant stenosis of the major arch vessel origins. Right carotid system: No evidence of dissection, stenosis  (50% or greater) or occlusion. Left carotid system: No evidence of dissection, stenosis (50% or greater) or occlusion. Vertebral arteries: Mild multifocal narrowing of the right vertebral artery secondary to mass effect from adjacent bony osteophytes. No evidence of significant (greater than 50%) arterial stenosis or occlusion. Skeleton: Multilevel degenerative change with degenerative anterolisthesis of C6 on C7. Other neck: No acute abnormality. Upper chest: No acute abnormality. Review of the MIP images confirms the above findings CTA HEAD FINDINGS Anterior circulation: No significant stenosis, proximal occlusion, aneurysm, or vascular malformation. Posterior circulation: No significant stenosis, proximal occlusion, aneurysm, or vascular malformation. Mild left PCA narrowing. Venous sinuses: As permitted by contrast timing, patent. Review of the MIP images confirms the above findings IMPRESSION: 1. No evidence of acute intracranial abnormality. 2. No significant (greater than 50%) arterial stenosis or occlusion in the head or neck. Electronically Signed   By: Feliberto Harts MD   On: 02/05/2020 11:53   CT Angio Neck W and/or Wo Contrast  Result Date: 02/05/2020 CLINICAL DATA:  Transient ischemic attack. Left-sided arm weakness and blurry vision/dizziness. EXAM: CT ANGIOGRAPHY HEAD AND NECK TECHNIQUE: Multidetector CT imaging of the head and neck was performed using the standard protocol during bolus administration of intravenous contrast. Multiplanar CT image reconstructions  and MIPs were obtained to evaluate the vascular anatomy. Carotid stenosis measurements (when applicable) are obtained utilizing NASCET criteria, using the distal internal carotid diameter as the denominator. CONTRAST:  OMNIPAQUE IOHEXOL 350 MG/ML SOLN COMPARISON:  Concurrent head CT. FINDINGS: CT HEAD FINDINGS Brain: No evidence of acute large vascular territory infarction, hemorrhage, hydrocephalus, extra-axial collection or mass  lesion/mass effect. Skull: Normal. Negative for fracture or focal lesion. Sinuses: Right maxillary sinus retention cyst. Otherwise, imaged portions are clear. Orbits: No acute finding. Review of the MIP images confirms the above findings CTA NECK FINDINGS Aortic arch: Imaged portion shows no evidence of aneurysm or dissection. No significant stenosis of the major arch vessel origins. Right carotid system: No evidence of dissection, stenosis (50% or greater) or occlusion. Left carotid system: No evidence of dissection, stenosis (50% or greater) or occlusion. Vertebral arteries: Mild multifocal narrowing of the right vertebral artery secondary to mass effect from adjacent bony osteophytes. No evidence of significant (greater than 50%) arterial stenosis or occlusion. Skeleton: Multilevel degenerative change with degenerative anterolisthesis of C6 on C7. Other neck: No acute abnormality. Upper chest: No acute abnormality. Review of the MIP images confirms the above findings CTA HEAD FINDINGS Anterior circulation: No significant stenosis, proximal occlusion, aneurysm, or vascular malformation. Posterior circulation: No significant stenosis, proximal occlusion, aneurysm, or vascular malformation. Mild left PCA narrowing. Venous sinuses: As permitted by contrast timing, patent. Review of the MIP images confirms the above findings IMPRESSION: 1. No evidence of acute intracranial abnormality. 2. No significant (greater than 50%) arterial stenosis or occlusion in the head or neck. Electronically Signed   By: Feliberto Harts MD   On: 02/05/2020 11:53   MR BRAIN WO CONTRAST  Result Date: 02/05/2020 CLINICAL DATA:  Blurry vision, neuro deficit EXAM: MRI HEAD WITHOUT CONTRAST MRI CERVICAL SPINE WITHOUT CONTRAST TECHNIQUE: Multiplanar, multiecho pulse sequences of the brain and surrounding structures, and cervical spine, to include the craniocervical junction and cervicothoracic junction, were obtained without intravenous  contrast. COMPARISON:  02/05/2020 CTA head and neck. FINDINGS: MRI HEAD FINDINGS Brain: Acute infarct involving the left hippocampal tail. No intracranial hemorrhage. Cerebral volume is within normal limits. Minimal chronic microvascular ischemic changes. No midline shift, ventriculomegaly or extra-axial fluid collection. No mass lesion. Vascular: Please see same day CTA head and neck. Skull and upper cervical spine: Normal marrow signal. Sinuses/Orbits: Normal orbits. Clear paranasal sinuses. No mastoid effusion. Other: None. MRI CERVICAL SPINE FINDINGS Alignment: Straightening of lordosis. Minimal grade 1 C6-7 and C7-T1 anterolisthesis. Vertebrae: Normal bone marrow signal intensity. No focal osseous lesion. Congenital spinal canal narrowing. Cord: Normal signal and morphology. Posterior Fossa, vertebral arteries: Please see MRI head. Disc levels: Multilevel desiccation and mild disc space loss. C2-3: Bilateral uncovertebral and facet hypertrophy. Patent spinal canal and neural foramen. C3-4: Central protrusion abutting the ventral cord with uncovertebral and facet hypertrophy. Mild spinal canal and left neural foraminal narrowing. Patent right neural foramen. C4-5: Disc osteophyte complex with superimposed central protrusion, uncovertebral and bilateral facet hypertrophy. Mild spinal canal and bilateral neural foraminal narrowing. C5-6: Small central protrusion. Uncovertebral and bilateral facet hypertrophy. Mild spinal canal and moderate bilateral neural foraminal narrowing. C6-7: Disc osteophyte complex with superimposed right foraminal protrusion. Right predominant uncovertebral and facet hypertrophy. Mild spinal canal, moderate right and mild left neural foraminal narrowing. C7-T1: No significant disc bulge. Uncovertebral and facet hypertrophy. Patent spinal canal and right neural foramen. Mild left neural foraminal narrowing. Paraspinal tissues: No acute finding. IMPRESSION: MRI HEAD: Acute left hippocampal  tail infarct.  Minimal chronic microvascular ischemic changes. These results were called by telephone at the time of interpretation on 02/05/2020 at 4:08 pm to provider Dr. Hyacinth Meeker, Who verbally acknowledged these results. MRI CERVICAL SPINE: Congenital spinal canal narrowing with superimposed spondylosis. No acute finding within the cervical spine. Mild C4-7 spinal canal narrowing. Moderate bilateral C5-6 and right C6-7 neural foraminal narrowing. Electronically Signed   By: Stana Bunting M.D.   On: 02/05/2020 16:08   MR BRAIN W CONTRAST  Result Date: 02/06/2020 CLINICAL DATA:  Stroke, follow-up; memory loss. EXAM: MRI HEAD WITH CONTRAST TECHNIQUE: Multiplanar, multiecho pulse sequences of the brain and surrounding structures were obtained with intravenous contrast. CONTRAST:  41mL GADAVIST GADOBUTROL 1 MMOL/ML IV SOLN COMPARISON:  Brain MRI 02/05/2020, CT angiogram head/neck 02/05/2020. FINDINGS: Brain: A limited post-contrast brain MRI protocol was performed as a follow-up to the noncontrast brain MRI of 02/05/2020. Only axial and coronal T1 weighted postcontrast sequences as well as a coronal T2 weighted sequence were obtained. Unchanged T2 hyperintensity and swelling of the left hippocampal tail. There is no appreciable abnormal enhancement at this site. There is faint enhancement within the midline pons. SWI signal loss is noted at this site on the prior brain MRI of 02/05/2020 and this likely is related to an underlying vascular malformation (i.e. capillary telangiectasia). No abnormal intracranial enhancement is identified elsewhere. Vascular: No unexpected finding. Skull and upper cervical spine: No abnormal enhancement is identified within the calvarium or visualized upper cervical spine. Sinuses/Orbits: No abnormal orbital enhancement. IMPRESSION: Unchanged T2 hyperintensity and swelling of the left hippocampal tail without corresponding abnormal enhancement. Homogeneous restricted diffusion was  present at this site on the brain MRI of 02/05/2020 and this likely reflects an acute infarct. However, 4-6 week MRI follow-up should be considered to exclude alternative etiologies (i.e. hippocampal mass). 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 Electronically Signed   By: Jackey Loge DO   On: 02/06/2020 13:26   MR Cervical Spine Wo Contrast  Result Date: 02/05/2020 CLINICAL DATA:  Blurry vision, neuro deficit EXAM: MRI HEAD WITHOUT CONTRAST MRI CERVICAL SPINE WITHOUT CONTRAST TECHNIQUE: Multiplanar, multiecho pulse sequences of the brain and surrounding structures, and cervical spine, to include the craniocervical junction and cervicothoracic junction, were obtained without intravenous contrast. COMPARISON:  02/05/2020 CTA head and neck. FINDINGS: MRI HEAD FINDINGS Brain: Acute infarct involving the left hippocampal tail. No intracranial hemorrhage. Cerebral volume is within normal limits. Minimal chronic microvascular ischemic changes. No midline shift, ventriculomegaly or extra-axial fluid collection. No mass lesion. Vascular: Please see same day CTA head and neck. Skull and upper cervical spine: Normal marrow signal. Sinuses/Orbits: Normal orbits. Clear paranasal sinuses. No mastoid effusion. Other: None. MRI CERVICAL SPINE FINDINGS Alignment: Straightening of lordosis. Minimal grade 1 C6-7 and C7-T1 anterolisthesis. Vertebrae: Normal bone marrow signal intensity. No focal osseous lesion. Congenital spinal canal narrowing. Cord: Normal signal and morphology. Posterior Fossa, vertebral arteries: Please see MRI head. Disc levels: Multilevel desiccation and mild disc space loss. C2-3: Bilateral uncovertebral and facet hypertrophy. Patent spinal canal and neural foramen. C3-4: Central protrusion abutting the ventral cord with uncovertebral and facet hypertrophy. Mild spinal canal and left neural foraminal narrowing. Patent right neural foramen. C4-5: Disc  osteophyte complex with superimposed central protrusion, uncovertebral and bilateral facet hypertrophy. Mild spinal canal and bilateral neural foraminal narrowing. C5-6: Small central protrusion. Uncovertebral and bilateral facet hypertrophy. Mild spinal canal and moderate bilateral neural foraminal narrowing. C6-7: Disc osteophyte complex with superimposed right foraminal protrusion. Right predominant  uncovertebral and facet hypertrophy. Mild spinal canal, moderate right and mild left neural foraminal narrowing. C7-T1: No significant disc bulge. Uncovertebral and facet hypertrophy. Patent spinal canal and right neural foramen. Mild left neural foraminal narrowing. Paraspinal tissues: No acute finding. IMPRESSION: MRI HEAD: Acute left hippocampal tail infarct. Minimal chronic microvascular ischemic changes. These results were called by telephone at the time of interpretation on 02/05/2020 at 4:08 pm to provider Dr. Hyacinth Meeker, Who verbally acknowledged these results. MRI CERVICAL SPINE: Congenital spinal canal narrowing with superimposed spondylosis. No acute finding within the cervical spine. Mild C4-7 spinal canal narrowing. Moderate bilateral C5-6 and right C6-7 neural foraminal narrowing. Electronically Signed   By: Stana Bunting M.D.   On: 02/05/2020 16:08   EP PPM/ICD IMPLANT  Result Date: 02/07/2020 SURGEON:  Hillis Range, MD   PREPROCEDURE DIAGNOSIS:  Cryptogenic Stroke   POSTPROCEDURE DIAGNOSIS:  Cryptogenic Stroke    PROCEDURES:  1. Implantable loop recorder implantation   INTRODUCTION:  Jeffery Willis is a 64 y.o. male with a history of unexplained stroke who presents today for implantable loop implantation.  The patient has had a cryptogenic stroke.  Despite an extensive workup by neurology, no reversible causes have been identified.  he has worn telemetry during which he did not have arrhythmias.  There is significant concern for possible atrial fibrillation as the cause for the patients stroke.   The patient therefore presents today for implantable loop implantation.   DESCRIPTION OF PROCEDURE:  Informed written consent was obtained.  The patient required no sedation for the procedure today.  The patients left chest was prepped and draped. Mapping over the patient's chest was performed to identify the appropriate ILR site.  This area was found to be the left parasternal region over the 3rd-4th intercostal space.  The skin overlying this region was infiltrated with lidocaine for local analgesia.  A 0.5-cm incision was made at the implant site.  A subcutaneous ILR pocket was fashioned using a combination of sharp and blunt dissection.  A Medtronic Reveal Linq model [id number] implantable loop recorder was then placed into the pocket R waves were very prominent and measured > 0.2 mV. EBL<1 ml.  Steri- Strips and a sterile dressing were then applied.  There were no early apparent complications.   CONCLUSIONS:  1. Successful implantation of a Medtronic Reveal LINQ implantable loop recorder for cryptogenic stroke  2. No early apparent complications. Hillis Range MD, [facility] 02/07/2020 1:06 PM   VAS [place] TRANSCRANIAL DOPPLER W BUBBLES  Result Date: 02/07/2020  Transcranial Doppler with Bubble Indications: Stroke. Comparison Study: No prior study Performing Technologist: Gertie Fey MHA, RDMS, RVT, RDCS  Examination Guidelines: A complete evaluation includes B-mode imaging, spectral Doppler, color Doppler, and power Doppler as needed of all accessible portions of each vessel. Bilateral testing is considered an integral part of a complete examination. Limited examinations for reoccurring indications may be performed as noted.  Summary: No HITS at rest or during Valsalva. Negative transcranial Doppler Bubble study with no evidence of right to left intracardiac communication.  A vascular evaluation was performed. The right middle cerebral artery was studied. An IV was inserted into the patient's left forearm. Verbal  informed consent was obtained.  Negative TCD Bubble study *See table(s) above for TCD measurements and observations.  Diagnosing physician: Delia [name] Electronically signed by Delia [name] on 02/07/2020 at 1:38:13 PM.    Final    ECHOCARDIOGRAM COMPLETE  Result Date: 02/06/2020    ECHOCARDIOGRAM REPORT   Patient Name:  Vinny Pantaleon Date of Exam: 02/06/2020 Medical Rec #:  [id number]       Height:       73.0 in Accession #:    [id number]      Weight:       190.0 lb Date of Birth:  05-Sep-1955       BSA:          2.105 m Patient Age:    64 years        BP:           122/79 mmHg Patient Gender: M               HR:           59 bpm. Exam Location:  Inpatient Procedure: 2D Echo Indications:    434.91 stroke  History:        Patient has no prior history of Echocardiogram examinations. No                 prior cardiac hx on file.  Sonographer:    Celene Skeen RDCS (AE) Referring Phys: [id number] JENNIFER YATES IMPRESSIONS  1. Left ventricular ejection fraction, by estimation, is 65 to 70%. The left ventricle has normal function. The left ventricle has no regional wall motion abnormalities. There is mild concentric left ventricular hypertrophy. Left ventricular diastolic parameters were normal.  2. Right ventricular systolic function is normal. The right ventricular size is normal.  3. The mitral valve is normal in structure. No evidence of mitral valve regurgitation. No evidence of mitral stenosis.  4. The aortic valve is normal in structure. Aortic valve regurgitation is not visualized. No aortic stenosis is present.  5. The inferior vena cava is normal in size with greater than 50% respiratory variability, suggesting right atrial pressure of 3 mmHg. Comparison(s): No prior Echocardiogram. Conclusion(s)/Recommendation(s): Normal biventricular function without evidence of hemodynamically significant valvular heart disease. No intracardiac source of embolism detected on this transthoracic study. A transesophageal  echocardiogram is recommended to exclude cardiac source of embolism if clinically indicated. FINDINGS  Left Ventricle: Left ventricular ejection fraction, by estimation, is 65 to 70%. The left ventricle has normal function. The left ventricle has no regional wall motion abnormalities. The left ventricular internal cavity size was normal in size. There is  mild concentric left ventricular hypertrophy. Left ventricular diastolic parameters were normal. Right Ventricle: The right ventricular size is normal. No increase in right ventricular wall thickness. Right ventricular systolic function is normal. Left Atrium: Left atrial size was normal in size. Right Atrium: Right atrial size was normal in size. Pericardium: There is no evidence of pericardial effusion. Mitral Valve: The mitral valve is normal in structure. No evidence of mitral valve regurgitation. No evidence of mitral valve stenosis. Tricuspid Valve: The tricuspid valve is normal in structure. Tricuspid valve regurgitation is not demonstrated. No evidence of tricuspid stenosis. Aortic Valve: The aortic valve is normal in structure. Aortic valve regurgitation is not visualized. No aortic stenosis is present. Pulmonic Valve: The pulmonic valve was normal in structure. Pulmonic valve regurgitation is not visualized. No evidence of pulmonic stenosis. Aorta: The aortic root is normal in size and structure. Venous: The inferior vena cava is normal in size with greater than 50% respiratory variability, suggesting right atrial pressure of 3 mmHg. IAS/Shunts: No atrial level shunt detected by color flow Doppler.  LEFT VENTRICLE PLAX 2D LVIDd:         4.90 cm  Diastology LVIDs:  2.70 cm  LV e' medial:    6.09 cm/s LV PW:         1.10 cm  LV E/e' medial:  12.8 LV IVS:        1.10 cm  LV e' lateral:   13.10 cm/s LVOT diam:     2.10 cm  LV E/e' lateral: 6.0 LV SV:         46 LV SV Index:   22 LVOT Area:     3.46 cm  LEFT ATRIUM             Index LA diam:         3.20 cm 1.52 cm/m LA Vol (A2C):   42.9 ml 20.38 ml/m LA Vol (A4C):   24.2 ml 11.50 ml/m LA Biplane Vol: 33.1 ml 15.72 ml/m  AORTIC VALVE LVOT Vmax:   72.50 cm/s LVOT Vmean:  50.500 cm/s LVOT VTI:    0.132 m  AORTA Ao Root diam: 3.20 cm MITRAL VALVE MV Area (PHT): 2.42 cm    SHUNTS MV Decel Time: 313 msec    Systemic VTI:  0.13 m MV E velocity: 78.00 cm/s  Systemic Diam: 2.10 cm MV A velocity: 61.70 cm/s MV E/A ratio:  1.26 Tobias AlexanderKatarina Nelson MD Electronically signed by Tobias AlexanderKatarina Nelson MD Signature Date/Time: 02/06/2020/1:13:44 PM    Final    ECHO TEE  Result Date: 02/07/2020    TRANSESOPHOGEAL ECHO REPORT   Patient Name:   Jermel Iles Date of Exam: 02/07/2020 Medical Rec #:  [id number]       Height:       73.0 in Accession #:    [id number]      Weight:       190.0 lb Date of Birth:  1956-03-19       BSA:          2.105 m Patient Age:    64 years        BP:           129/82 mmHg Patient Gender: M               HR:           76 bpm. Exam Location:  Inpatient Procedure: Transesophageal Echo, Color Doppler and Cardiac Doppler Indications:     Stroke I163.9  History:         Patient has prior history of Echocardiogram examinations, most                  recent 02/06/2020.  Sonographer:     Thurman Coyerasey Kirkpatrick RDCS (AE) Referring Phys:  [id number] Roe RutherfordANGELA NICOLE DUKE Diagnosing Phys: Chilton Siiffany Hickory MD PROCEDURE: After discussion of the risks and benefits of a TEE, an informed consent was obtained from the patient. The transesophogeal probe was passed without difficulty through the esophogus of the patient. Sedation performed by different physician. The patient was monitored while under deep sedation. Anesthestetic sedation was provided intravenously by Anesthesiology: 205.77mg  of Propofol, 100mg  of Lidocaine. The patient's vital signs; including heart rate, blood pressure, and oxygen saturation; remained stable throughout the procedure. The patient developed no complications during the procedure. IMPRESSIONS  1. Left  ventricular ejection fraction, by estimation, is 60 to 65%. The left ventricle has normal function. The left ventricle has no regional wall motion abnormalities.  2. Right ventricular systolic function is normal. The right ventricular size is normal.  3. No left atrial/left atrial appendage thrombus was detected.  4. The mitral valve is normal in structure.  Trivial mitral valve regurgitation. No evidence of mitral stenosis.  5. The aortic valve is tricuspid. Aortic valve regurgitation is not visualized. No aortic stenosis is present.  6. Agitated saline contrast bubble study was negative, with no evidence of any interatrial shunt. Conclusion(s)/Recommendation(s): Normal biventricular function without evidence of hemodynamically significant valvular heart disease. FINDINGS  Left Ventricle: Left ventricular ejection fraction, by estimation, is 60 to 65%. The left ventricle has normal function. The left ventricle has no regional wall motion abnormalities. The left ventricular internal cavity size was normal in size. There is  no left ventricular hypertrophy. Right Ventricle: The right ventricular size is normal. No increase in right ventricular wall thickness. Right ventricular systolic function is normal. Left Atrium: Left atrial size was normal in size. No left atrial/left atrial appendage thrombus was detected. Right Atrium: Right atrial size was normal in size. Pericardium: There is no evidence of pericardial effusion. Mitral Valve: The mitral valve is normal in structure. Trivial mitral valve regurgitation. No evidence of mitral valve stenosis. Tricuspid Valve: The tricuspid valve is normal in structure. Tricuspid valve regurgitation is trivial. No evidence of tricuspid stenosis. Aortic Valve: The aortic valve is tricuspid. Aortic valve regurgitation is not visualized. No aortic stenosis is present. Pulmonic Valve: The pulmonic valve was normal in structure. Pulmonic valve regurgitation is not visualized. No  evidence of pulmonic stenosis. Aorta: The aortic root is normal in size and structure. IAS/Shunts: No atrial level shunt detected by color flow Doppler. Agitated saline contrast was given intravenously to evaluate for intracardiac shunting. Agitated saline contrast bubble study was negative, with no evidence of any interatrial shunt. [name] MD Electronically signed by [name] MD Signature Date/Time: 02/07/2020/12:50:17 PM    Final       Discharge Exam: Vitals:   02/07/20 1251 02/07/20 1301  BP: (!) 155/86 (!) 155/84  Pulse: 72 73  Resp: 14 11  Temp:    SpO2: 100% 99%   Vitals:   02/07/20 1231 02/07/20 1241 02/07/20 1251 02/07/20 1301  BP: 129/82 (!) 142/86 (!) 155/86 (!) 155/84  Pulse: 68 73 72 73  Resp: 12 12 14 11   Temp:      TempSrc:      SpO2: 96% 99% 100% 99%  Weight:      Height:        General: Pt is alert, awake, not in acute distress Cardiovascular: RRR, S1/S2 +, no rubs, no gallops Respiratory: CTA bilaterally, no wheezing, no rhonchi Abdominal: Soft, NT, ND, bowel sounds + Extremities: no edema, no cyanosis    The results of significant diagnostics from this hospitalization (including imaging, microbiology, ancillary and laboratory) are listed below for reference.     Microbiology: Recent Results (from the past 240 hour(s))  Respiratory Panel by RT PCR (Flu A&B, Covid) - Nasopharyngeal Swab     Status: None   Collection Time: 02/05/20  6:00 PM   Specimen: Nasopharyngeal Swab  Result Value Ref Range Status   SARS Coronavirus 2 by RT PCR NEGATIVE NEGATIVE Final    Comment: (NOTE) SARS-CoV-2 target nucleic acids are NOT DETECTED.  The SARS-CoV-2 RNA is generally detectable in upper respiratoy specimens during the acute phase of infection. The lowest concentration of SARS-CoV-2 viral copies this assay can detect is 131 copies/mL. A negative result does not preclude SARS-Cov-2 infection and should not be used as the sole basis for treatment  or other patient management decisions. A negative result may occur with  improper specimen collection/handling, submission of specimen other than  nasopharyngeal swab, presence of viral mutation(s) within the areas targeted by this assay, and inadequate number of viral copies (<131 copies/mL). A negative result must be combined with clinical observations, patient history, and epidemiological information. The expected result is Negative.  Fact Sheet for Patients:  https://www.moore.com/  Fact Sheet for Healthcare Providers:  https://www.young.biz/  This test is no t yet approved or cleared by the [place] FDA and  has been authorized for detection and/or diagnosis of SARS-CoV-2 by FDA under an Emergency Use Authorization (EUA). This EUA will remain  in effect (meaning this test can be used) for the duration of the COVID-19 declaration under Section 564(b)(1) of the Act, 21 U.S.C. section 360bbb-3(b)(1), unless the authorization is terminated or revoked sooner.     Influenza A by PCR NEGATIVE NEGATIVE Final   Influenza B by PCR NEGATIVE NEGATIVE Final    Comment: (NOTE) The Xpert Xpress SARS-CoV-2/FLU/RSV assay is intended as an aid in  the diagnosis of influenza from Nasopharyngeal swab specimens and  should not be used as a sole basis for treatment. Nasal washings and  aspirates are unacceptable for Xpert Xpress SARS-CoV-2/FLU/RSV  testing.  Fact Sheet for Patients: https://www.moore.com/  Fact Sheet for Healthcare Providers: https://www.young.biz/  This test is not yet approved or cleared by the [place] FDA and  has been authorized for detection and/or diagnosis of SARS-CoV-2 by  FDA under an Emergency Use Authorization (EUA). This EUA will remain  in effect (meaning this test can be used) for the duration of the  Covid-19 declaration under Section 564(b)(1) of the Act, 21  U.S.C.  section 360bbb-3(b)(1), unless the authorization is  terminated or revoked. Performed at [facility], [address], [place], [place] [address]      Labs: BNP (last 3 results) No results for input(s): BNP in the last 8760 hours. Basic Metabolic Panel: Recent Labs  Lab 02/05/20 1024 02/07/20 0415  NA 138 138  K 3.8 4.3  CL 101 103  CO2 27 27  GLUCOSE 171* 105*  BUN 21 13  CREATININE 1.06 1.09  CALCIUM 9.2 8.9   Liver Function Tests: No results for input(s): AST, ALT, ALKPHOS, BILITOT, PROT, ALBUMIN in the last 168 hours. No results for input(s): LIPASE, AMYLASE in the last 168 hours. No results for input(s): AMMONIA in the last 168 hours. CBC: Recent Labs  Lab 02/05/20 1024  WBC 8.6  NEUTROABS 6.6  HGB 15.7  HCT 46.0  MCV 90.0  PLT 209   Cardiac Enzymes: No results for input(s): CKTOTAL, CKMB, CKMBINDEX, TROPONINI in the last 168 hours. BNP: Invalid input(s): POCBNP CBG: Recent Labs  Lab 02/05/20 1015  GLUCAP 189*   D-Dimer No results for input(s): DDIMER in the last 72 hours. Hgb A1c Recent Labs    02/06/20 0513  HGBA1C 5.6   Lipid Profile Recent Labs    02/06/20 0513  CHOL 139  HDL 44  LDLCALC 77  TRIG 88  CHOLHDL 3.2   Thyroid function studies Recent Labs    02/06/20 0513  TSH 1.586   Anemia work up No results for input(s): VITAMINB12, FOLATE, FERRITIN, TIBC, IRON, RETICCTPCT in the last 72 hours. Urinalysis    Component Value Date/Time   COLORURINE YELLOW 02/05/2020 1110   APPEARANCEUR CLEAR 02/05/2020 1110   LABSPEC 1.020 02/05/2020 1110   PHURINE 6.0 02/05/2020 1110   GLUCOSEU NEGATIVE 02/05/2020 1110   HGBUR NEGATIVE 02/05/2020 1110   BILIRUBINUR NEGATIVE 02/05/2020 1110   KETONESUR NEGATIVE 02/05/2020 1110  PROTEINUR NEGATIVE 02/05/2020 1110   UROBILINOGEN 0.2 07/19/2010 0430   NITRITE NEGATIVE 02/05/2020 1110   LEUKOCYTESUR NEGATIVE 02/05/2020 1110   Sepsis Labs Invalid input(s): PROCALCITONIN,  WBC,   LACTICIDVEN Microbiology Recent Results (from the past 240 hour(s))  Respiratory Panel by RT PCR (Flu A&B, Covid) - Nasopharyngeal Swab     Status: None   Collection Time: 02/05/20  6:00 PM   Specimen: Nasopharyngeal Swab  Result Value Ref Range Status   SARS Coronavirus 2 by RT PCR NEGATIVE NEGATIVE Final    Comment: (NOTE) SARS-CoV-2 target nucleic acids are NOT DETECTED.  The SARS-CoV-2 RNA is generally detectable in upper respiratoy specimens during the acute phase of infection. The lowest concentration of SARS-CoV-2 viral copies this assay can detect is 131 copies/mL. A negative result does not preclude SARS-Cov-2 infection and should not be used as the sole basis for treatment or other patient management decisions. A negative result may occur with  improper specimen collection/handling, submission of specimen other than nasopharyngeal swab, presence of viral mutation(s) within the areas targeted by this assay, and inadequate number of viral copies (<131 copies/mL). A negative result must be combined with clinical observations, patient history, and epidemiological information. The expected result is Negative.  Fact Sheet for Patients:  https://www.moore.com/  Fact Sheet for Healthcare Providers:  https://www.young.biz/  This test is no t yet approved or cleared by the [place] FDA and  has been authorized for detection and/or diagnosis of SARS-CoV-2 by FDA under an Emergency Use Authorization (EUA). This EUA will remain  in effect (meaning this test can be used) for the duration of the COVID-19 declaration under Section 564(b)(1) of the Act, 21 U.S.C. section 360bbb-3(b)(1), unless the authorization is terminated or revoked sooner.     Influenza A by PCR NEGATIVE NEGATIVE Final   Influenza B by PCR NEGATIVE NEGATIVE Final    Comment: (NOTE) The Xpert Xpress SARS-CoV-2/FLU/RSV assay is intended as an aid in  the diagnosis of  influenza from Nasopharyngeal swab specimens and  should not be used as a sole basis for treatment. Nasal washings and  aspirates are unacceptable for Xpert Xpress SARS-CoV-2/FLU/RSV  testing.  Fact Sheet for Patients: https://www.moore.com/  Fact Sheet for Healthcare Providers: https://www.young.biz/  This test is not yet approved or cleared by the [place] FDA and  has been authorized for detection and/or diagnosis of SARS-CoV-2 by  FDA under an Emergency Use Authorization (EUA). This EUA will remain  in effect (meaning this test can be used) for the duration of the  Covid-19 declaration under Section 564(b)(1) of the Act, 21  U.S.C. section 360bbb-3(b)(1), unless the authorization is  terminated or revoked. Performed at [facility], [address], [place], [place] [address]      Time coordinating discharge: Over 30 minutes  SIGNED:   Hughie Closs, MD  [facility] 02/07/2020, 2:11 PM  If 7PM-7AM, please contact night-coverage [email or web address]

## 2020-02-07 NOTE — Consult Note (Signed)
ELECTROPHYSIOLOGY CONSULT NOTE  Patient ID: Jeffery Willis MRN: [id number], DOB/AGE: 1956-03-23   Admit date: 02/05/2020 Date of Consult: 02/07/2020  Primary Physician: Joaquim Nam, MD   Reason for Consultation: Cryptogenic stroke; recommendations regarding risks of arrhythmia as well as arrhythmia detection options  History of Present Illness Jeffery Willis was admitted on 02/05/2020 with acute CVA. he has been monitored on telemetry which has demonstrated no arrhythmias. No cause has been identified. Inpatient stroke work-up is to be completed with a TEE. EP has been asked to evaluate for placement of an implantable loop recorder to monitor for atrial fibrillation.  He has palpitations at home but reports that these are rare.  Past Medical History:  Diagnosis Date  . Anxiety   . History of cardiovascular stress test 2/07   normal myocardial perfusion without evidence of infarction of ischemia.  Given these  results and tthe patient's persistence of flipped T waves, I think these are likely asymmptomatic and  clinically insignificant findings  . Renal calculus    per [facility]  . Skin lesion      Past Surgical History:  Procedure Laterality Date  . 2 D Echo  06/2005   normal LV function and no LVH.  Normal 2D echowith ER60 to 70%  . KIDNEY STONE SURGERY       No Known Allergies  Inpatient Medications .  stroke: mapping our early stages of recovery book   Does not apply Once  . aspirin EC  81 mg Oral Daily  . atorvastatin  40 mg Oral Daily  . clopidogrel  75 mg Oral Daily  . escitalopram  10 mg Oral Daily   . sodium chloride 50 mL/hr at 02/06/20 1401  . sodium chloride      Social History   Socioeconomic History  . Marital status: Married    Spouse name: Not on file  . Number of children: 2  . Years of education: Not on file  . Highest education level: Not on file  Occupational History  . Occupation: Teacher, early years/pre, [employer] school, works Engineering geologist in  [employer]  . Smoking status: Never Smoker  . Smokeless tobacco: Never Used  Vaping Use  . Vaping Use: Never used  Substance and Sexual Activity  . Alcohol use: No  . Drug use: No  . Sexual activity: Not on file  Other Topics Concern  . Not on file  Social History Narrative   Regular exercise, jogs 3 times a week.   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 Difficulty of Paying Living Expenses: Not on file  Food Insecurity:   . Worried About Programme researcher, broadcasting/film/video in the Last Year: Not on file  . Ran Out of Food in the Last Year: Not on file  Transportation Needs:   . Lack of Transportation (Medical): Not on file  . Lack of Transportation (Non-Medical): Not on file  Physical Activity:   . Days of Exercise per Week: Not on file  . Minutes of Exercise per Session: Not on file  Stress:   . Feeling of Stress : Not on file  Social Connections:   . Frequency of Communication with Friends and Family: Not on file  . Frequency of Social Gatherings with Friends and Family: Not on file  .  Attends Religious Services: Not on file  . Active Member of Clubs or Organizations: Not on file  . Attends Banker Meetings: Not on file  . Marital Status: Not on file  Intimate Partner Violence:   . Fear of Current or Ex-Partner: Not on file  . Emotionally Abused: Not on file  . Physically Abused: Not on file  . Sexually Abused: Not on file     Review of Systems All systems reviewed and are otherwise negative except as noted above.  Family History  Problem Relation Age of Onset  . Heart disease Mother        arrhythmia  . Hypertension Father        mild  . Arthritis Father   . Prostate cancer Paternal Grandfather   . Colon cancer Neg Hx   . Stroke Neg Hx     Physical Exam Blood  pressure (!) 152/92, pulse 64, temperature 98.3 F (36.8 C), temperature source Oral, resp. rate 19, height 6\' 1"  (1.854 m), weight 86.2 kg, SpO2 99 %.  General: Well developed, well appearing 63 y.o. male in no acute distress. HEENT: Normocephalic, atraumatic. EOMs intact. Sclera nonicteric. Oropharynx clear.  Neck: Supple without bruits. No JVD. Lungs: normal WOB Heart: RRR  Abdomen: Soft    Extremities: No clubbing, cyanosis or edema  Psych: Normal affect. Musculoskeletal: No kyphosis. Skin: Intact. Warm and dry. No rashes or petechiae in exposed areas.   Labs Lab Results  Component Value Date   WBC 8.6 02/05/2020   HGB 15.7 02/05/2020   HCT 46.0 02/05/2020   MCV 90.0 02/05/2020   PLT 209 02/05/2020    Recent Labs  Lab 02/07/20 0415  NA 138  K 4.3  CL 103  CO2 27  BUN 13  CREATININE 1.09  CALCIUM 8.9  GLUCOSE 105*   Recent Labs    02/05/20 1024  INR 1.1    Radiology/Studies CT Angio Head W or Wo Contrast  Result Date: 02/05/2020 CLINICAL DATA:  Transient ischemic attack. Left-sided arm weakness and blurry vision/dizziness. EXAM: CT ANGIOGRAPHY HEAD AND NECK TECHNIQUE: Multidetector CT imaging of the head and neck was performed using the standard protocol during bolus administration of intravenous contrast. Multiplanar CT image reconstructions and MIPs were obtained to evaluate the vascular anatomy. Carotid stenosis measurements (when applicable) are obtained utilizing NASCET criteria, using the distal internal carotid diameter as the denominator. CONTRAST:  02/07/2020 OMNIPAQUE IOHEXOL 350 MG/ML SOLN COMPARISON:  Concurrent head CT. FINDINGS: CT HEAD FINDINGS Brain: No evidence of acute large vascular territory infarction, hemorrhage, hydrocephalus, extra-axial collection or mass lesion/mass effect. Skull: Normal. Negative for fracture or focal lesion. Sinuses: Right maxillary sinus retention cyst. Otherwise, imaged portions are clear. Orbits: No acute finding. Review of the  MIP images confirms the above findings CTA NECK FINDINGS Aortic arch: Imaged portion shows no evidence of aneurysm or dissection. No significant stenosis of the major arch vessel origins. Right carotid system: No evidence of dissection, stenosis (50% or greater) or occlusion. Left carotid system: No evidence of dissection, stenosis (50% or greater) or occlusion. Vertebral arteries: Mild multifocal narrowing of the right vertebral artery secondary to mass effect from adjacent bony osteophytes. No evidence of significant (greater than 50%) arterial stenosis or occlusion. Skeleton: Multilevel degenerative change with degenerative anterolisthesis of C6 on C7. Other neck: No acute abnormality. Upper chest: No acute abnormality. Review of the MIP images confirms the above findings CTA HEAD FINDINGS Anterior circulation: No significant stenosis, proximal occlusion, aneurysm, or vascular malformation.  Posterior circulation: No significant stenosis, proximal occlusion, aneurysm, or vascular malformation. Mild left PCA narrowing. Venous sinuses: As permitted by contrast timing, patent. Review of the MIP images confirms the above findings IMPRESSION: 1. No evidence of acute intracranial abnormality. 2. No significant (greater than 50%) arterial stenosis or occlusion in the head or neck. Electronically Signed   By: Feliberto HartsFrederick S Jones MD   On: 02/05/2020 11:53   CT Angio Neck W and/or Wo Contrast  Result Date: 02/05/2020 CLINICAL DATA:  Transient ischemic attack. Left-sided arm weakness and blurry vision/dizziness. EXAM: CT ANGIOGRAPHY HEAD AND NECK TECHNIQUE: Multidetector CT imaging of the head and neck was performed using the standard protocol during bolus administration of intravenous contrast. Multiplanar CT image reconstructions and MIPs were obtained to evaluate the vascular anatomy. Carotid stenosis measurements (when applicable) are obtained utilizing NASCET criteria, using the distal internal carotid diameter as the  denominator. CONTRAST:  100mL OMNIPAQUE IOHEXOL 350 MG/ML SOLN COMPARISON:  Concurrent head CT. FINDINGS: CT HEAD FINDINGS Brain: No evidence of acute large vascular territory infarction, hemorrhage, hydrocephalus, extra-axial collection or mass lesion/mass effect. Skull: Normal. Negative for fracture or focal lesion. Sinuses: Right maxillary sinus retention cyst. Otherwise, imaged portions are clear. Orbits: No acute finding. Review of the MIP images confirms the above findings CTA NECK FINDINGS Aortic arch: Imaged portion shows no evidence of aneurysm or dissection. No significant stenosis of the major arch vessel origins. Right carotid system: No evidence of dissection, stenosis (50% or greater) or occlusion. Left carotid system: No evidence of dissection, stenosis (50% or greater) or occlusion. Vertebral arteries: Mild multifocal narrowing of the right vertebral artery secondary to mass effect from adjacent bony osteophytes. No evidence of significant (greater than 50%) arterial stenosis or occlusion. Skeleton: Multilevel degenerative change with degenerative anterolisthesis of C6 on C7. Other neck: No acute abnormality. Upper chest: No acute abnormality. Review of the MIP images confirms the above findings CTA HEAD FINDINGS Anterior circulation: No significant stenosis, proximal occlusion, aneurysm, or vascular malformation. Posterior circulation: No significant stenosis, proximal occlusion, aneurysm, or vascular malformation. Mild left PCA narrowing. Venous sinuses: As permitted by contrast timing, patent. Review of the MIP images confirms the above findings IMPRESSION: 1. No evidence of acute intracranial abnormality. 2. No significant (greater than 50%) arterial stenosis or occlusion in the head or neck. Electronically Signed   By: Feliberto HartsFrederick S Jones MD   On: 02/05/2020 11:53   MR BRAIN WO CONTRAST  Result Date: 02/05/2020 CLINICAL DATA:  Blurry vision, neuro deficit EXAM: MRI HEAD WITHOUT CONTRAST MRI  CERVICAL SPINE WITHOUT CONTRAST TECHNIQUE: Multiplanar, multiecho pulse sequences of the brain and surrounding structures, and cervical spine, to include the craniocervical junction and cervicothoracic junction, were obtained without intravenous contrast. COMPARISON:  02/05/2020 CTA head and neck. FINDINGS: MRI HEAD FINDINGS Brain: Acute infarct involving the left hippocampal tail. No intracranial hemorrhage. Cerebral volume is within normal limits. Minimal chronic microvascular ischemic changes. No midline shift, ventriculomegaly or extra-axial fluid collection. No mass lesion. Vascular: Please see same day CTA head and neck. Skull and upper cervical spine: Normal marrow signal. Sinuses/Orbits: Normal orbits. Clear paranasal sinuses. No mastoid effusion. Other: None. MRI CERVICAL SPINE FINDINGS Alignment: Straightening of lordosis. Minimal grade 1 C6-7 and C7-T1 anterolisthesis. Vertebrae: Normal bone marrow signal intensity. No focal osseous lesion. Congenital spinal canal narrowing. Cord: Normal signal and morphology. Posterior Fossa, vertebral arteries: Please see MRI head. Disc levels: Multilevel desiccation and mild disc space loss. C2-3: Bilateral uncovertebral and facet hypertrophy. Patent spinal canal and neural  foramen. C3-4: Central protrusion abutting the ventral cord with uncovertebral and facet hypertrophy. Mild spinal canal and left neural foraminal narrowing. Patent right neural foramen. C4-5: Disc osteophyte complex with superimposed central protrusion, uncovertebral and bilateral facet hypertrophy. Mild spinal canal and bilateral neural foraminal narrowing. C5-6: Small central protrusion. Uncovertebral and bilateral facet hypertrophy. Mild spinal canal and moderate bilateral neural foraminal narrowing. C6-7: Disc osteophyte complex with superimposed right foraminal protrusion. Right predominant uncovertebral and facet hypertrophy. Mild spinal canal, moderate right and mild left neural foraminal  narrowing. C7-T1: No significant disc bulge. Uncovertebral and facet hypertrophy. Patent spinal canal and right neural foramen. Mild left neural foraminal narrowing. Paraspinal tissues: No acute finding. IMPRESSION: MRI HEAD: Acute left hippocampal tail infarct. Minimal chronic microvascular ischemic changes. These results were called by telephone at the time of interpretation on 02/05/2020 at 4:08 pm to provider Dr. Hyacinth Meeker, Who verbally acknowledged these results. MRI CERVICAL SPINE: Congenital spinal canal narrowing with superimposed spondylosis. No acute finding within the cervical spine. Mild C4-7 spinal canal narrowing. Moderate bilateral C5-6 and right C6-7 neural foraminal narrowing. Electronically Signed   By: Stana Bunting M.D.   On: 02/05/2020 16:08   MR BRAIN W CONTRAST  Result Date: 02/06/2020 CLINICAL DATA:  Stroke, follow-up; memory loss. EXAM: MRI HEAD WITH CONTRAST TECHNIQUE: Multiplanar, multiecho pulse sequences of the brain and surrounding structures were obtained with intravenous contrast. CONTRAST:  8mL GADAVIST GADOBUTROL 1 MMOL/ML IV SOLN COMPARISON:  Brain MRI 02/05/2020, CT angiogram head/neck 02/05/2020. FINDINGS: Brain: A limited post-contrast brain MRI protocol was performed as a follow-up to the noncontrast brain MRI of 02/05/2020. Only axial and coronal T1 weighted postcontrast sequences as well as a coronal T2 weighted sequence were obtained. Unchanged T2 hyperintensity and swelling of the left hippocampal tail. There is no appreciable abnormal enhancement at this site. There is faint enhancement within the midline pons. SWI signal loss is noted at this site on the prior brain MRI of 02/05/2020 and this likely is related to an underlying vascular malformation (i.e. capillary telangiectasia). No abnormal intracranial enhancement is identified elsewhere. Vascular: No unexpected finding. Skull and upper cervical spine: No abnormal enhancement is identified within the calvarium or  visualized upper cervical spine. Sinuses/Orbits: No abnormal orbital enhancement. IMPRESSION: Unchanged T2 hyperintensity and swelling of the left hippocampal tail without corresponding abnormal enhancement. Homogeneous restricted diffusion was present at this site on the brain MRI of 02/05/2020 and this likely reflects an acute infarct. However, 4-6 week MRI follow-up should be considered to exclude alternative etiologies (i.e. hippocampal mass). 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 Electronically Signed   By: Jackey Loge DO   On: 02/06/2020 13:26   MR Cervical Spine Wo Contrast  Result Date: 02/05/2020 CLINICAL DATA:  Blurry vision, neuro deficit EXAM: MRI HEAD WITHOUT CONTRAST MRI CERVICAL SPINE WITHOUT CONTRAST TECHNIQUE: Multiplanar, multiecho pulse sequences of the brain and surrounding structures, and cervical spine, to include the craniocervical junction and cervicothoracic junction, were obtained without intravenous contrast. COMPARISON:  02/05/2020 CTA head and neck. FINDINGS: MRI HEAD FINDINGS Brain: Acute infarct involving the left hippocampal tail. No intracranial hemorrhage. Cerebral volume is within normal limits. Minimal chronic microvascular ischemic changes. No midline shift, ventriculomegaly or extra-axial fluid collection. No mass lesion. Vascular: Please see same day CTA head and neck. Skull and upper cervical spine: Normal marrow signal. Sinuses/Orbits: Normal orbits. Clear paranasal sinuses. No mastoid effusion. Other: None. MRI CERVICAL SPINE FINDINGS Alignment: Straightening of lordosis. Minimal grade 1 C6-7 and  C7-T1 anterolisthesis. Vertebrae: Normal bone marrow signal intensity. No focal osseous lesion. Congenital spinal canal narrowing. Cord: Normal signal and morphology. Posterior Fossa, vertebral arteries: Please see MRI head. Disc levels: Multilevel desiccation and mild disc space loss. C2-3: Bilateral uncovertebral and facet  hypertrophy. Patent spinal canal and neural foramen. C3-4: Central protrusion abutting the ventral cord with uncovertebral and facet hypertrophy. Mild spinal canal and left neural foraminal narrowing. Patent right neural foramen. C4-5: Disc osteophyte complex with superimposed central protrusion, uncovertebral and bilateral facet hypertrophy. Mild spinal canal and bilateral neural foraminal narrowing. C5-6: Small central protrusion. Uncovertebral and bilateral facet hypertrophy. Mild spinal canal and moderate bilateral neural foraminal narrowing. C6-7: Disc osteophyte complex with superimposed right foraminal protrusion. Right predominant uncovertebral and facet hypertrophy. Mild spinal canal, moderate right and mild left neural foraminal narrowing. C7-T1: No significant disc bulge. Uncovertebral and facet hypertrophy. Patent spinal canal and right neural foramen. Mild left neural foraminal narrowing. Paraspinal tissues: No acute finding. IMPRESSION: MRI HEAD: Acute left hippocampal tail infarct. Minimal chronic microvascular ischemic changes. These results were called by telephone at the time of interpretation on 02/05/2020 at 4:08 pm to provider Dr. Hyacinth Meeker, Who verbally acknowledged these results. MRI CERVICAL SPINE: Congenital spinal canal narrowing with superimposed spondylosis. No acute finding within the cervical spine. Mild C4-7 spinal canal narrowing. Moderate bilateral C5-6 and right C6-7 neural foraminal narrowing. Electronically Signed   By: Stana Bunting M.D.   On: 02/05/2020 16:08   VAS [place] TRANSCRANIAL DOPPLER W BUBBLES  Result Date: 02/06/2020  Transcranial Doppler with Bubble Indications: Stroke. Comparison Study: No prior study Performing Technologist: Gertie Fey MHA, RDMS, RVT, RDCS  Examination Guidelines: A complete evaluation includes B-mode imaging, spectral Doppler, color Doppler, and power Doppler as needed of all accessible portions of each vessel. Bilateral testing is  considered an integral part of a complete examination. Limited examinations for reoccurring indications may be performed as noted.  Summary: No HITS at rest or during Valsalva. Negative transcranial Doppler Bubble study with no evidence of right to left intracardiac communication.  A vascular evaluation was performed. The right middle cerebral artery was studied. An IV was inserted into the patient's left forearm. Verbal informed consent was obtained.  *See table(s) above for TCD measurements and observations.    Preliminary    ECHOCARDIOGRAM COMPLETE  Result Date: 02/06/2020    ECHOCARDIOGRAM REPORT   Patient Name:   Jeffery Willis Date of Exam: 02/06/2020 Medical Rec #:  [id number]       Height:       73.0 in Accession #:    [id number]      Weight:       190.0 lb Date of Birth:  1956/01/17       BSA:          2.105 m Patient Age:    64 years        BP:           122/79 mmHg Patient Gender: M               HR:           59 bpm. Exam Location:  Inpatient Procedure: 2D Echo Indications:    434.91 stroke  History:        Patient has no prior history of Echocardiogram examinations. No                 prior cardiac hx on file.  Sonographer:    Celene Skeen RDCS (AE) Referring Phys:  [id number] JENNIFER YATES IMPRESSIONS  1. Left ventricular ejection fraction, by estimation, is 65 to 70%. The left ventricle has normal function. The left ventricle has no regional wall motion abnormalities. There is mild concentric left ventricular hypertrophy. Left ventricular diastolic parameters were normal.  2. Right ventricular systolic function is normal. The right ventricular size is normal.  3. The mitral valve is normal in structure. No evidence of mitral valve regurgitation. No evidence of mitral stenosis.  4. The aortic valve is normal in structure. Aortic valve regurgitation is not visualized. No aortic stenosis is present.  5. The inferior vena cava is normal in size with greater than 50% respiratory variability, suggesting right  atrial pressure of 3 mmHg. Comparison(s): No prior Echocardiogram. Conclusion(s)/Recommendation(s): Normal biventricular function without evidence of hemodynamically significant valvular heart disease. No intracardiac source of embolism detected on this transthoracic study. A transesophageal echocardiogram is recommended to exclude cardiac source of embolism if clinically indicated. FINDINGS  Left Ventricle: Left ventricular ejection fraction, by estimation, is 65 to 70%. The left ventricle has normal function. The left ventricle has no regional wall motion abnormalities. The left ventricular internal cavity size was normal in size. There is  mild concentric left ventricular hypertrophy. Left ventricular diastolic parameters were normal. Right Ventricle: The right ventricular size is normal. No increase in right ventricular wall thickness. Right ventricular systolic function is normal. Left Atrium: Left atrial size was normal in size. Right Atrium: Right atrial size was normal in size. Pericardium: There is no evidence of pericardial effusion. Mitral Valve: The mitral valve is normal in structure. No evidence of mitral valve regurgitation. No evidence of mitral valve stenosis. Tricuspid Valve: The tricuspid valve is normal in structure. Tricuspid valve regurgitation is not demonstrated. No evidence of tricuspid stenosis. Aortic Valve: The aortic valve is normal in structure. Aortic valve regurgitation is not visualized. No aortic stenosis is present. Pulmonic Valve: The pulmonic valve was normal in structure. Pulmonic valve regurgitation is not visualized. No evidence of pulmonic stenosis. Aorta: The aortic root is normal in size and structure. Venous: The inferior vena cava is normal in size with greater than 50% respiratory variability, suggesting right atrial pressure of 3 mmHg. IAS/Shunts: No atrial level shunt detected by color flow Doppler.  LEFT VENTRICLE PLAX 2D LVIDd:         4.90 cm  Diastology LVIDs:          2.70 cm  LV e' medial:    6.09 cm/s LV PW:         1.10 cm  LV E/e' medial:  12.8 LV IVS:        1.10 cm  LV e' lateral:   13.10 cm/s LVOT diam:     2.10 cm  LV E/e' lateral: 6.0 LV SV:         46 LV SV Index:   22 LVOT Area:     3.46 cm  LEFT ATRIUM             Index LA diam:        3.20 cm 1.52 cm/m LA Vol (A2C):   42.9 ml 20.38 ml/m LA Vol (A4C):   24.2 ml 11.50 ml/m LA Biplane Vol: 33.1 ml 15.72 ml/m  AORTIC VALVE LVOT Vmax:   72.50 cm/s LVOT Vmean:  50.500 cm/s LVOT VTI:    0.132 m  AORTA Ao Root diam: 3.20 cm MITRAL VALVE MV Area (PHT): 2.42 cm    SHUNTS MV Decel Time: 313 msec  Systemic VTI:  0.13 m MV E velocity: 78.00 cm/s  Systemic Diam: 2.10 cm MV A velocity: 61.70 cm/s MV E/A ratio:  1.26 Jeffery Alexander MD Electronically signed by Jeffery Alexander MD Signature Date/Time: 02/06/2020/1:13:44 PM    Final     Echocardiogram  Preserved EF.  No valvular dysfunction  12-lead ECG sinus All ekgs in epic are reviewed personally Telemetry sinus rhythm, no afib detected    Assessment and Plan 1. Cryptogenic stroke The patient presents with stroke of unknown source.  Workup has been unrevealing thus far.  There is concern for cardioembolic source.  TEE is pending.  If the TEE is negative, I recommend loop recorder insertion to monitor for AF. The indication for loop recorder insertion / monitoring for AF in setting of cryptogenic stroke was discussed with the patient. The loop recorder insertion procedure was reviewed in detail including risks and benefits. These risks include but are not limited to bleeding and infection. The patient expressed verbal understanding and agrees to proceed. The patient was also counseled regarding wound care and device follow-up.  Hillis Range MD, [facility] 02/07/2020 9:48 AM

## 2020-02-07 NOTE — Transfer of Care (Signed)
Immediate Anesthesia Transfer of Care Note  Patient: Jeffery Willis  Procedure(s) Performed: TRANSESOPHAGEAL ECHOCARDIOGRAM (TEE) (N/A ) BUBBLE STUDY  Patient Location: Endoscopy Unit  Anesthesia Type:MAC  Level of Consciousness: drowsy  Airway & Oxygen Therapy: Patient Spontanous Breathing and Patient connected to nasal cannula oxygen  Post-op Assessment: Report given to RN and Post -op Vital signs reviewed and stable  Post vital signs: Reviewed and stable  Last Vitals:  Vitals Value Taken Time  BP 142/72 02/07/20 1222  Temp    Pulse 78 02/07/20 1222  Resp    SpO2 97 % 02/07/20 1222    Last Pain:  Vitals:   02/07/20 1107  TempSrc: Oral  PainSc: 0-No pain         Complications: No complications documented.

## 2020-02-07 NOTE — Consult Note (Signed)
   [facility] CM Inpatient Consult   02/07/2020  Xeng Kucher 1955/08/17 [id number]   [facility] [[facility]]  Accountable Care Organization [ACO] Patient:  [place] plan  Patient assigned to [facility] RN Care Coordinator for transition of care follow up for support and needs.  For questions, please contact:  Charlesetta Shanks, RN BSN CCM [facility]  [phone] business mobile phone Toll free office [phone]  Fax number: [phone] [email or web address]

## 2020-02-07 NOTE — Telephone Encounter (Signed)
Cian states that this has been taken care of.

## 2020-02-07 NOTE — H&P (Signed)
Jeffery Willis is a 64 y.o. male who has presented today for surgery, with the diagnosis of stroke.  The various methods of treatment have been discussed with the patient and family. After consideration of risks, benefits and other options for treatment, the patient has consented to  Procedure(s): TRANSESOPHAGEAL ECHOCARDIOGRAM (TEE) (N/A) as a surgical intervention .  The patient's history has been reviewed, patient examined, no change in status, stable for surgery.  I have reviewed the patient's chart and labs.  Questions were answered to the patient's satisfaction.    Toshiyuki Fredell C. Duke Salvia, MD, [facility]  02/07/2020 11:55 AM

## 2020-02-07 NOTE — TOC Transition Note (Signed)
Transition of Care [facility]) - CM/SW Discharge Note   Patient Details  Name: Jeffery Willis MRN: [id number] Date of Birth: 1955-06-28  Transition of Care [facility]) CM/SW Contact:  Pollie Friar, RN Phone Number: 02/07/2020, 2:31 PM   Clinical Narrative:    Pt is discharging home with recommendations for outpatient therapy. CM met with the patient and his spouse and they prefer to attend at [facility]. Orders in Epic and information on the AVS. Pt has transportation home.    Final next level of care: OP Rehab Barriers to Discharge: No Barriers Identified   Patient Goals and CMS Choice        Discharge Placement                       Discharge Plan and Services                                     Social Determinants of Health (SDOH) Interventions     Readmission Risk Interventions No flowsheet data found.

## 2020-02-07 NOTE — Progress Notes (Addendum)
  Echocardiogram Echocardiogram Transesophageal has been performed.  Jeffery Willis 02/07/2020, 12:42 PM

## 2020-02-07 NOTE — Progress Notes (Signed)
STROKE TEAM PROGRESS NOTE   INTERVAL HISTORY His wife is at the bedside.   TEE shows no cardiac source of embolism or PFO.  Loop recorder has been placed.  Patient declined participation in the BMS [employer] study yesterday.  He still continues to have right superior quadrantanopsia.  His short-term memory appears to be improving.  He has no complaints today.  Vital signs are stable.  Neurological exam is unchanged.  Vitals:   02/07/20 1231 02/07/20 1241 02/07/20 1251 02/07/20 1301  BP: 129/82 (!) 142/86 (!) 155/86 (!) 155/84  Pulse: 68 73 72 73  Resp: 12 12 14 11   Temp:      TempSrc:      SpO2: 96% 99% 100% 99%  Weight:      Height:       CBC:  Recent Labs  Lab 02/05/20 1024  WBC 8.6  NEUTROABS 6.6  HGB 15.7  HCT 46.0  MCV 90.0  PLT 209   Basic Metabolic Panel:  Recent Labs  Lab 02/05/20 1024 02/07/20 0415  NA 138 138  K 3.8 4.3  CL 101 103  CO2 27 27  GLUCOSE 171* 105*  BUN 21 13  CREATININE 1.06 1.09  CALCIUM 9.2 8.9   Lipid Panel:  Recent Labs  Lab 02/06/20 0513  CHOL 139  TRIG 88  HDL 44  CHOLHDL 3.2  VLDL 18  LDLCALC 77   HgbA1c:  Recent Labs  Lab 02/06/20 0513  HGBA1C 5.6   Urine Drug Screen:  Recent Labs  Lab 02/05/20 1110  LABOPIA NONE DETECTED  COCAINSCRNUR NONE DETECTED  LABBENZ NONE DETECTED  AMPHETMU NONE DETECTED  THCU NONE DETECTED  LABBARB NONE DETECTED    Alcohol Level  Recent Labs  Lab 02/05/20 1024  ETH <10    IMAGING past 24 hours EP PPM/ICD IMPLANT  Result Date: 02/07/2020 SURGEON:  02/09/2020, MD   PREPROCEDURE DIAGNOSIS:  Cryptogenic Stroke   POSTPROCEDURE DIAGNOSIS:  Cryptogenic Stroke    PROCEDURES:  1. Implantable loop recorder implantation   INTRODUCTION:  Jeffery Willis is a 64 y.o. male with a history of unexplained stroke who presents today for implantable loop implantation.  The patient has had a cryptogenic stroke.  Despite an extensive workup by neurology, no reversible causes have been identified.  he  has worn telemetry during which he did not have arrhythmias.  There is significant concern for possible atrial fibrillation as the cause for the patients stroke.  The patient therefore presents today for implantable loop implantation.   DESCRIPTION OF PROCEDURE:  Informed written consent was obtained.  The patient required no sedation for the procedure today.  The patients left chest was prepped and draped. Mapping over the patient's chest was performed to identify the appropriate ILR site.  This area was found to be the left parasternal region over the 3rd-4th intercostal space.  The skin overlying this region was infiltrated with lidocaine for local analgesia.  A 0.5-cm incision was made at the implant site.  A subcutaneous ILR pocket was fashioned using a combination of sharp and blunt dissection.  A Medtronic Reveal Linq model 64 implantable loop recorder was then placed into the pocket R waves were very prominent and measured > 0.2 mV. EBL<1 ml.  Steri- Strips and a sterile dressing were then applied.  There were no early apparent complications.   CONCLUSIONS:  1. Successful implantation of a Medtronic Reveal LINQ implantable loop recorder for cryptogenic stroke  2. No early apparent complications. [id number] MD,  [facility] 02/07/2020 1:06 PM   VAS [place] TRANSCRANIAL DOPPLER W BUBBLES  Result Date: 02/07/2020  Transcranial Doppler with Bubble Indications: Stroke. Comparison Study: No prior study Performing Technologist: Gertie Fey MHA, RDMS, RVT, RDCS  Examination Guidelines: A complete evaluation includes B-mode imaging, spectral Doppler, color Doppler, and power Doppler as needed of all accessible portions of each vessel. Bilateral testing is considered an integral part of a complete examination. Limited examinations for reoccurring indications may be performed as noted.  Summary: No HITS at rest or during Valsalva. Negative transcranial Doppler Bubble study with no evidence of right to left  intracardiac communication.  A vascular evaluation was performed. The right middle cerebral artery was studied. An IV was inserted into the patient's left forearm. Verbal informed consent was obtained.  Negative TCD Bubble study *See table(s) above for TCD measurements and observations.  Diagnosing physician: Jeffery [name] Electronically signed by Jeffery [name] on 02/07/2020 at 1:38:13 PM.    Final    ECHO TEE  Result Date: 02/07/2020    TRANSESOPHOGEAL ECHO REPORT   Patient Name:   Jeffery Willis Date of Exam: 02/07/2020 Medical Rec #:  [id number]       Height:       73.0 in Accession #:    [id number]      Weight:       190.0 lb Date of Birth:  [birth year]       BSA:          2.105 m Patient Age:    64 years        BP:           129/82 mmHg Patient Gender: M               HR:           76 bpm. Exam Location:  Inpatient Procedure: Transesophageal Echo, Color Doppler and Cardiac Doppler Indications:     Stroke I163.9  History:         Patient has prior history of Echocardiogram examinations, most                  recent 02/06/2020.  Sonographer:     Thurman Coyer RDCS (AE) Referring Phys:  [id number] [name] Diagnosing Phys: [name] MD PROCEDURE: After discussion of the risks and benefits of a TEE, an informed consent was obtained from the patient. The transesophogeal probe was passed without difficulty through the esophogus of the patient. Sedation performed by different physician. The patient was monitored while under deep sedation. Anesthestetic sedation was provided intravenously by Anesthesiology: 205.77mg  of Propofol, 100mg  of Lidocaine. The patient's vital signs; including heart rate, blood pressure, and oxygen saturation; remained stable throughout the procedure. The patient developed no complications during the procedure. IMPRESSIONS  1. Left ventricular ejection fraction, by estimation, is 60 to 65%. The left ventricle has normal function. The left ventricle has no regional wall  motion abnormalities.  2. Right ventricular systolic function is normal. The right ventricular size is normal.  3. No left atrial/left atrial appendage thrombus was detected.  4. The mitral valve is normal in structure. Trivial mitral valve regurgitation. No evidence of mitral stenosis.  5. The aortic valve is tricuspid. Aortic valve regurgitation is not visualized. No aortic stenosis is present.  6. Agitated saline contrast bubble study was negative, with no evidence of any interatrial shunt. Conclusion(s)/Recommendation(s): Normal biventricular function without evidence of hemodynamically significant valvular heart disease. FINDINGS  Left Ventricle: Left ventricular ejection fraction,  by estimation, is 60 to 65%. The left ventricle has normal function. The left ventricle has no regional wall motion abnormalities. The left ventricular internal cavity size was normal in size. There is  no left ventricular hypertrophy. Right Ventricle: The right ventricular size is normal. No increase in right ventricular wall thickness. Right ventricular systolic function is normal. Left Atrium: Left atrial size was normal in size. No left atrial/left atrial appendage thrombus was detected. Right Atrium: Right atrial size was normal in size. Pericardium: There is no evidence of pericardial effusion. Mitral Valve: The mitral valve is normal in structure. Trivial mitral valve regurgitation. No evidence of mitral valve stenosis. Tricuspid Valve: The tricuspid valve is normal in structure. Tricuspid valve regurgitation is trivial. No evidence of tricuspid stenosis. Aortic Valve: The aortic valve is tricuspid. Aortic valve regurgitation is not visualized. No aortic stenosis is present. Pulmonic Valve: The pulmonic valve was normal in structure. Pulmonic valve regurgitation is not visualized. No evidence of pulmonic stenosis. Aorta: The aortic root is normal in size and structure. IAS/Shunts: No atrial level shunt detected by color flow  Doppler. Agitated saline contrast was given intravenously to evaluate for intracardiac shunting. Agitated saline contrast bubble study was negative, with no evidence of any interatrial shunt. [name] MD Electronically signed by [name] MD Signature Date/Time: 02/07/2020/12:50:17 PM    Final     PHYSICAL EXAM Pleasant middle-age Caucasian male not in distress. . Afebrile. Head is nontraumatic. Neck is supple without bruit.    Cardiac exam no murmur or gallop. Lungs are clear to auscultation. Distal pulses are well felt. Neurological Exam ;  Awake  Alert oriented x 3. Normal speech and language.eye movements full without nystagmus.fundi were not visualized. Vision acuity   appears normal.  Right superior quadrantanopsia.  Hearing is normal. Palatal movements are normal. Face symmetric. Tongue midline. Normal strength, tone, reflexes and coordination. Normal sensation. Gait deferred.  NIH stroke scale 1 premorbid modified Rankin 0  ASSESSMENT/PLAN Jeffery Willis is a 64 y.o. male with no significant PMH presenting with blurry vision/dizziness and HA, tingling of LUE, concern for memory difficulties prior to admission.   Stroke:   L hippocampal infarct embolic secondary to unknown source  CTA head & neck no acute abnormality, no significant stenoses  MRI  L hippocampal infarct.  MRI CS congenital narrowing w/ spondylosis. No acute finding.   Given memory difficulties, will Check MRI w/ constrast shows no enhancement to suggest tumor and is more compatible with acute infarct  2D Echo normal ejection fraction.  TEE to look for embolic source. Arranged with [facility] for tomorrow at 1430 w/ Dr. Jani Files. (I have made patient NPO after midnight tonight).  If TEE negative, a [facility] electrophysiologist will consult and consider placement of an implantable loop recorder to evaluate for atrial fibrillation as etiology of  stroke. This has been explained to patient/family by Dr. Pearlean Brownie and they are agreeable.  TCD bubble negative for right-to-left shunt   LDL 77  HgbA1c 5.6  VTE prophylaxis - Lovenox 40 mg sq daily   aspirin 81 mg daily prior to admission, now on aspirin 325 mg daily. 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 SLP assessment pending   Anxiety  Hospital day # 2 Patient presented with sudden onset of vision difficulties and dizziness likely due to embolic left PCA infarct of cryptogenic etiology.  TEE was negative and loop recorder has been placed.  Continue   Aspirin and Plavix for 3 weeks followed by Plavix alone.  Long discussion with patient and wife and answered questions.  Follow-up as an outpatient stroke clinic in 6 weeks.  Greater than 50% time during this 25-minute visit were spent on counseling and coordination of care about his embolic stroke and answering question about stroke prevention and treatment.  Discussed with Dr. Britta MccreedyPahvani.  Stroke team will sign off. Jeffery HeadyPramod Laurieann Friddle, MD To contact Stroke Continuity provider, please refer to [email or web address]. After hours, contact General Neurology

## 2020-02-07 NOTE — Plan of Care (Signed)

## 2020-02-07 NOTE — Evaluation (Addendum)
Physical Therapy Evaluation and Discharge Patient Details Name: Jeffery Willis MRN: [id number] DOB: 1955-08-15 Today's Date: 02/07/2020   History of Present Illness  64 y.o. male with no pertinent risk factor in Phx presents as a transfer from [facility] for concern of stroke (sudden onset of blurry vision and dizziness on Wednesday, episode lasted approx 1 hour and self-improved with residual deficits). MRI brain Acute L hippocampal tail infarct. Per Neurologist, R homonymous superior quadrantanopia.   Clinical Impression   Patient evaluated by Physical Therapy with all education completed and the patient has no further questions. Patient has long history (>30 years) of vestibular deficits which have been exacerbated by recent CVA. He quickly has increased dizziness/vertigo, nausea, and incr headache with simple ambulation. He scored 20/24 on Dynamic Gait Index due to decreased velocity (guarded gait), difficulty with head turns while walking, and difficulty with maneuvering around objects (no loss of balance, but further reduces his velocity). Lot of education (and hand-out provided) re: role of PT in vestibular rehab and potential benefits of OPPT. Patient agrees to referral.  See below for any follow-up Physical Therapy or equipment needs. PT is signing off. Thank you for this referral.  NOTE-Wife was present 9/27 pm when this PT first interviewed pt re: vestibular deficits. Patient left for testing and wife remained with continued education re: role of PT with vestibular rehab and referral process.     Follow Up Recommendations Outpatient PT (for Vestibular Rehab)    Equipment Recommendations  None recommended by PT    Recommendations for Other Services       Precautions / Restrictions Precautions Precaution Comments: pt with chronic dizziness, denies falls      Mobility  Bed Mobility Overal bed mobility: Independent                Transfers Overall transfer level:  Independent Equipment used: None Transfers: Sit to/from Stand Sit to Stand: Independent         General transfer comment: repeated x 2 without imbalance  Ambulation/Gait Ambulation/Gait assistance: Supervision;Modified independent (Device/Increase time) Gait Distance (Feet): 400 Feet Assistive device: None Gait Pattern/deviations: Step-through pattern;Decreased stride length (decr arm swing "guarded")   Gait velocity interpretation: 1.31 - 2.62 ft/sec, indicative of limited community ambulator General Gait Details: pt with decr velocity and very little head movement with incr "dizziness," nausea, and headache after simple walking x 200 ft in quiet hallway. No imbalance, but + incr symptoms with DGI and pt further slows his velocity with gait challenges  Stairs            Wheelchair Mobility    Modified Rankin (Stroke Patients Only) Modified Rankin (Stroke Patients Only) Pre-Morbid Rankin Score: No symptoms Modified Rankin: Slight disability     Balance Overall balance assessment: Modified Independent   Sitting balance-Leahy Scale: Good       Standing balance-Leahy Scale: Good           Rhomberg - Eyes Opened: 30 Rhomberg - Eyes Closed: 20 (slight incr sway ant-posterior)     Standardized Balance Assessment Standardized Balance Assessment : Dynamic Gait Index   Dynamic Gait Index Level Surface: Mild Impairment Change in Gait Speed: Normal Gait with Horizontal Head Turns: Mild Impairment Gait with Vertical Head Turns: Mild Impairment Gait and Pivot Turn: Normal Step Over Obstacle: Normal Step Around Obstacles: Mild Impairment Steps: Normal Total Score: 20       Pertinent Vitals/Pain Pain Assessment: 0-10 Pain Score: 4  Pain Location: head Pain Descriptors /  Indicators: Aching Pain Intervention(s): Limited activity within patient's tolerance;Monitored during session    [place] expects to be discharged to:: Private  residence Living Arrangements: Spouse/significant other Available Help at Discharge: Family;Available PRN/intermittently Type of Home: House Home Access: Stairs to enter Entrance Stairs-Rails: None Entrance Stairs-Number of Steps: 7 Home Layout: Two level Home Equipment: None      Prior Function Level of Independence: Independent         Comments: works as a Software engineer, Health and safety inspector   Dominant Hand: Right    Extremity/Trunk Assessment   Upper Extremity Assessment Upper Extremity Assessment: Overall WFL for tasks assessed    Lower Extremity Assessment Lower Extremity Assessment: Overall WFL for tasks assessed    Cervical / Trunk Assessment Cervical / Trunk Assessment: Normal  Communication   Communication: No difficulties  Cognition Arousal/Alertness: Awake/alert Behavior During Therapy: WFL for tasks assessed/performed Overall Cognitive Status: Impaired/Different from baseline Area of Impairment: Memory                     Memory: Decreased short-term memory         General Comments: Did not recall talking to this PT re: his vestibular issues on 9/27 (we discussed for ~10 minutes)      General Comments General comments (skin integrity, edema, etc.): Educated on function of vestibular system and how his years of self-limiting his movements to avoid feeling dizzy have further "weakened" his system's ability to respond appropriately. He reports symptoms are significantly worse since CVA, however has seen improvement over past few days (but not back to his usual baseline vertigo issues). Educated on role of vestibular rehab and how he would use habituation to ultimately reduce his symptoms. He is not sure he is significantly limited by his vertigo to require OPPT. Written information provided and pt agreed to referral to OPPT for initial assessment.     Exercises     Assessment/Plan    PT Assessment All further PT needs can be met in the next  venue of care  PT Problem List Decreased activity tolerance;Decreased mobility;Decreased balance       PT Treatment Interventions      PT Goals (Current goals can be found in the Care Plan section)  Acute Rehab PT Goals Patient Stated Goal: return to prior level of function PT Goal Formulation: All assessment and education complete, DC therapy    Frequency     Barriers to discharge        Co-evaluation               AM-PAC PT "6 Clicks" Mobility  Outcome Measure Help needed turning from your back to your side while in a flat bed without using bedrails?: None Help needed moving from lying on your back to sitting on the side of a flat bed without using bedrails?: None Help needed moving to and from a bed to a chair (including a wheelchair)?: None Help needed standing up from a chair using your arms (e.g., wheelchair or bedside chair)?: None Help needed to walk in hospital room?: None Help needed climbing 3-5 steps with a railing? : None 6 Click Score: 24    End of Session Equipment Utilized During Treatment: Gait belt Activity Tolerance: Treatment limited secondary to medical complications (Comment) (incr vertigo, nausea, headache) Patient left: in chair;with call bell/phone within reach;with chair alarm set   PT Visit Diagnosis: Other abnormalities of gait and mobility (R26.89);Other symptoms and signs  involving the nervous system (R29.898)    Time: 5462-7035 PT Time Calculation (min) (ACUTE ONLY): 29 min   Charges:   PT Evaluation $PT Eval Low Complexity: 1 Low PT Treatments $Self Care/Home Management: 8-22         Arby Barrette, PT Pager [phone]   Rexanne Mano 02/07/2020, 9:36 AM

## 2020-02-07 NOTE — Telephone Encounter (Signed)
Received phone call from Cian from [facility] who said that they received a call from pt's pcp who said cardiologist was supposed to download a app. Please call Cian at [phone]

## 2020-02-07 NOTE — Progress Notes (Signed)
Initial Nutrition Assessment  DOCUMENTATION CODES:   Not applicable  INTERVENTION:  Once pt's diet is advanced after procedure:  Ensure Enlive po BID, each supplement provides 350 kcal and 20 grams of protein  MVI daily   NUTRITION DIAGNOSIS:   Inadequate oral intake related to decreased appetite as evidenced by meal completion < 50%.    GOAL:   Patient will meet greater than or equal to 90% of their needs    MONITOR:   PO intake, Supplement acceptance, Labs, I & O's, Weight trends  REASON FOR ASSESSMENT:   Consult Assessment of nutrition requirement/status  ASSESSMENT:   Pt admitted with acute CVA. PMH includes anxiety.  Pt unavailable at time of RD visit. Pt having TEE.  Reviewed weight history. No significant wt changes noted.   PO intake: 45% x 1 recorded meal  Labs and medications reviewed.  NUTRITION - FOCUSED PHYSICAL EXAM:  Unable to perform at this time, will attempt at follow-up.  Diet Order:   Diet Order            Diet NPO time specified Except for: Sips with Meds  Diet effective midnight                 EDUCATION NEEDS:   No education needs have been identified at this time  Skin:  Skin Assessment: Reviewed RN Assessment  Last BM:  9/26  Height:   Ht Readings from Last 1 Encounters:  02/06/20 6\' 1"  (1.854 m)    Weight:   Wt Readings from Last 1 Encounters:  02/06/20 86.2 kg    BMI:  Body mass index is 25.07 kg/m.  Estimated Nutritional Needs:   Kcal:  2150-2350  Protein:  105-115 grams  Fluid:  >/=2.15L/d    04-19-1990, MS, RD, LDN RD pager number and weekend/on-call pager number located in Amion.

## 2020-02-08 ENCOUNTER — Other Ambulatory Visit: Payer: Self-pay | Admitting: *Deleted

## 2020-02-08 ENCOUNTER — Telehealth: Payer: Self-pay

## 2020-02-08 ENCOUNTER — Encounter (HOSPITAL_COMMUNITY): Payer: Self-pay | Admitting: Cardiovascular Disease

## 2020-02-08 NOTE — Anesthesia Postprocedure Evaluation (Signed)
Anesthesia Post Note  Patient: Jeffery Willis  Procedure(s) Performed: TRANSESOPHAGEAL ECHOCARDIOGRAM (TEE) (N/A ) BUBBLE STUDY     Patient location during evaluation: PACU Anesthesia Type: MAC Level of consciousness: awake and alert Pain management: pain level controlled Vital Signs Assessment: post-procedure vital signs reviewed and stable Respiratory status: spontaneous breathing, nonlabored ventilation, respiratory function stable and patient connected to nasal cannula oxygen Cardiovascular status: stable and blood pressure returned to baseline Postop Assessment: no apparent nausea or vomiting Anesthetic complications: no   No complications documented.  Last Vitals:  Vitals:   02/07/20 1301 02/07/20 1515  BP: (!) 155/84 (!) 139/93  Pulse: 73 64  Resp: 11 16  Temp:  36.8 C  SpO2: 99% 100%    Last Pain:  Vitals:   02/07/20 1515  TempSrc: Oral  PainSc:                  Elmire Amrein

## 2020-02-08 NOTE — Telephone Encounter (Signed)
Transition Care Management Follow-up Telephone Call  Date of discharge and from where: 02/07/2020, [facility]  How have you been since you were released from the hospital? Patient states that he is doing okay.   Any questions or concerns? No  Items Reviewed:  Did the pt receive and understand the discharge instructions provided? Yes   Medications obtained and verified? Yes   Any new allergies since your discharge? No   Dietary orders reviewed? Yes  Do you have support at home? Yes   Functional Questionnaire: (I = Independent and D = Dependent) ADLs: I  Bathing/Dressing- I  Meal Prep- I  Eating- I  Maintaining continence- I  Transferring/Ambulation- I  Managing Meds- I  Follow up appointments reviewed:   PCP Hospital f/u appt confirmed? No  Patient stated that he was outside right now and will call the office back to schedule the appointment once he gets inside and looks at his schedule.  Specialist Hospital f/u appt confirmed? Schedule with neurology  Are transportation arrangements needed? No   If their condition worsens, is the pt aware to call PCP or go to the Emergency Dept.? Yes  Was the patient provided with contact information for the PCP's office or ED? Yes  Was to pt encouraged to call back with questions or concerns? Yes

## 2020-02-08 NOTE — Patient Outreach (Signed)
[facility]) Care Management  02/08/2020  Dexter Signor [birth year] [id number]   Transition of care call/case closure   COVERING Everette Rank   Initial outreach: 02/08/2020 Discharge 02/07/2020   Subjective: Initial successful telephone call to patient's preferred number in order to complete transition of care assessment; 2 HIPAA identifiers verified. Explained purpose of call and completed transition of care assessment.  States he is doing well no acute symptoms.  Pt verified a good support system with family. He denies any ongoing health issues and says he does not need a referral to one of the [place] chronic disease management programs.  He denies educational needs related to staying safe during the COVID 19 pandemic.    Objective:  Mr. Salce was hospitalized at [facility]. He was discharged to home on 02/07/20 with orders for out patient rehabilitation at [facility].   Assessment:  Patient voices good understanding of all discharge instructions.  See transition of care flowsheet for assessment details.   Plan:  Reviewed hospital discharge diagnosis of transient neurological symptoms and treatment plan using hospital discharge instructions, assessing medication adherence, reviewing postoperative problems requiring provider notification, and discussing the importance of follow up appointments, primary care provider and specialists as directed.  No ongoing care management needs identified so will close case to [facility] Care Management services and route successful outreach letter with [facility] Care Management pamphlet and 24 Hour Nurse Line Magnet to [employer] Care Management clinical pool to be mailed to patient's home address.   Elliot Cousin, RN Care Management Coordinator [facility] Main Office [phone]

## 2020-02-14 ENCOUNTER — Encounter: Payer: Self-pay | Admitting: Emergency Medicine

## 2020-02-14 ENCOUNTER — Other Ambulatory Visit: Payer: Self-pay

## 2020-02-14 ENCOUNTER — Telehealth: Payer: Self-pay | Admitting: Neurology

## 2020-02-14 ENCOUNTER — Ambulatory Visit (INDEPENDENT_AMBULATORY_CARE_PROVIDER_SITE_OTHER): Payer: 59 | Admitting: Emergency Medicine

## 2020-02-14 DIAGNOSIS — I639 Cerebral infarction, unspecified: Secondary | ICD-10-CM

## 2020-02-14 LAB — CUP PACEART INCLINIC DEVICE CHECK
Date Time Interrogation Session: 20211005184355
Implantable Pulse Generator Implant Date: 20210928

## 2020-02-14 NOTE — Progress Notes (Signed)
ILR wound check in clinic. Steri strips removed. Wound well healed. Home monitor transmitting nightly. No episodes. Questions answered.  

## 2020-02-14 NOTE — Telephone Encounter (Signed)
Patient presented to the lobby this morning requesting a return to work letter. Please mail the letter to his home address. His best call back is. [phone]

## 2020-02-14 NOTE — Telephone Encounter (Signed)
I spoke to the patient's wife over the phone.  Patient is doing well and his peripheral vision loss has improved and short-term memory is also almost back to baseline.  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

## 2020-02-14 NOTE — Patient Instructions (Signed)
Call [employer] tech support to trouble shoot loading app on your cell phone. Caqll the device clinic if you have any questions or concerns. [phone]

## 2020-02-15 NOTE — Telephone Encounter (Signed)
Patient asked me to call his HR at work to get fax number to send Return to Work letter to.  Levora Dredge @ [phone] who gave me [phone] to fax letter to.  Letter has been faxed and return notification of successful delivery.

## 2020-02-24 ENCOUNTER — Other Ambulatory Visit: Payer: Self-pay

## 2020-02-24 ENCOUNTER — Ambulatory Visit: Payer: 59 | Admitting: Family Medicine

## 2020-02-24 ENCOUNTER — Encounter: Payer: Self-pay | Admitting: Family Medicine

## 2020-02-24 VITALS — BP 146/80 | HR 84 | Temp 96.7°F | Ht 73.0 in | Wt 196.4 lb

## 2020-02-24 DIAGNOSIS — Z8673 Personal history of transient ischemic attack (TIA), and cerebral infarction without residual deficits: Secondary | ICD-10-CM | POA: Diagnosis not present

## 2020-02-24 DIAGNOSIS — Z23 Encounter for immunization: Secondary | ICD-10-CM

## 2020-02-24 MED ORDER — ATORVASTATIN CALCIUM 40 MG PO TABS: 40.0000 mg | ORAL_TABLET | Freq: Every day | ORAL | 3 refills | Status: DC

## 2020-02-24 MED ORDER — CLOPIDOGREL BISULFATE 75 MG PO TABS
75.0000 mg | ORAL_TABLET | Freq: Every day | ORAL | 3 refills | Status: AC
Start: 2020-02-24 — End: 2020-03-25

## 2020-02-24 MED ORDER — ESCITALOPRAM OXALATE 10 MG PO TABS
10.0000 mg | ORAL_TABLET | Freq: Every day | ORAL | Status: DC
Start: 2020-02-24 — End: 2021-03-18

## 2020-02-24 NOTE — Progress Notes (Signed)
This visit occurred during the SARS-CoV-2 public health emergency.  Safety protocols were in place, including screening questions prior to the visit, additional usage of staff PPE, and extensive cleaning of exam room while observing appropriate contact time as indicated for disinfecting solutions. =====================================  Admit date: 02/05/2020 Discharge date: 02/07/2020  Admitted From: Home Disposition: Home  Recommendations for Outpatient Follow-up:  1. Follow up with PCP in 1-2 weeks 2. Follow with cardiology in 2 weeks 3. Follow-up with neurology in 6 weeks 4. Please obtain BMP/CBC in one week 5. Please follow up with your PCP on the following pending results:    Unresulted Labs (From admission, onward)         None       Home Health: None Equipment/Devices: None  Discharge Condition: Stable CODE STATUS: Full code Diet recommendation: Cardiac  Subjective: Seen and examined this morning.  No new complaint.  Tells me that his vision has improved almost 90%.  Brief/Interim Summary: Sean Halcombis a 64 y.o.malewith medical history significant ofanxiety presented with acute onset of blurred vision.He went the to [facility] on Wednesday and felt quite dizzy, but they gave him some glucose and he felt better. About 830 last night (9/25), he developed acutely abnormal vision. It was blurry but also appeared to be in different planes/ He was unable to do anything with his vision so altered, and it lasted for about 5 minutes that bad and then improved but never normalized. He had some ataxia as well as L temporoparietal and eye pain. He also noticed bilateral arm tingling and his left leg "felt different." He had significant difficulty focusing his vision. He went to [facility] for evaluation. Head CT was negative and he was transferred to the [facility] ER for MRI and was admitted under hospitalist service.  Neurology was consulted.  MRI brain confirmed acute left  hippocampal tail infarct.  Minimal chronic microvascular ischemic changes.  Patient was started on Plavix as well as aspirin was continued.  He was also started on atorvastatin 40 mg.  MRI brain with contrast was obtained to rule out mass.  Which did rule out mass and confirmed a stroke once again.  Patient was continuously monitored on telemetry and there was no evidence of arrhythmia.  Due to cryptogenic nature of the stroke, cardiology was consulted and patient underwent TEE which did not show any intracardiac thrombus or any other abnormality.  Loop recorder was placed.  Patient was evaluated by PT OT and PT recommended outpatient PT.  He was cleared by neurology.  Per my discussion with Dr. Pearlean Brownie who recommended discharging patient with aspirin and Plavix and then discontinuing aspirin after 3 weeks but continuing Plavix indefinitely.  He has personally discussed those instructions with patient and his wife.  I spoke to the wife who is in agreement with discharge.  I will also discharge this patient on atorvastatin 40 mg p.o. daily.  The only deficit this patient had was right more than nuclear hemianopsia.  His vision has improved but is still there is some deficit.  Neurology has personally advised this patient to limit his driving due to vision deficit.  Neurology has also restricted this patient to go to work until 1 week.  Patient will be provided a letter for that.  Discharge Diagnoses:  Principal Problem:   Acute CVA (cerebrovascular accident) [facility]) Active Problems:   Anxiety =====================================  Inpatient course discussed with patient.  He had acute onset neurologic symptoms that initially responded to glucose, this was after  working out.  He felt back to baseline but had a second episode of neurologic symptoms that led him to go to the emergency room.  Work-up was noted for stroke.  He had follow-up imaging noted with negative bubble study and negative carotid imaging.  He  was discharged home on aspirin and Plavix with the plan to transition to only Plavix.  He was started on Lipitor in the meantime.  No adverse effect on medication.  He does not have any new symptoms in the meantime.  Rationale for inpatient course discussed with patient.  Secondary prevention of stroke discussed with patient.  He is still on Lexapro at baseline.  He has felt well enough to return to work in the meantime. He has some slowing of recall but that is getting better. Vision changes are better in the meantime.  No new neurologic symptoms noted by patient.  He has loop recorder placed.  Rationale discussed with patient.  Meds, vitals, and allergies reviewed.   ROS: Per HPI unless specifically indicated in ROS section   GEN: nad, alert and oriented HEENT: Ncat NECK: supple w/o LA CV: rrr.  no murmur PULM: ctab, no inc wob ABD: soft, +bs EXT: no edema SKIN: no acute rash, healing loop recorder site noted on the left chest. CN 2-12 wnl B, S/S/DTR wnl x4  At least 30 minutes were devoted to patient care in this encounter (this can potentially include time spent reviewing the patient's file/history, interviewing and examining the patient, counseling/reviewing plan with patient, ordering referrals, ordering tests, reviewing relevant laboratory or x-ray data, and documenting the encounter).

## 2020-02-24 NOTE — Patient Instructions (Addendum)
Don't change your meds for now.  Recheck labs in about 1 month at a fasting lab visit.  Take care.  Glad to see you. Update me as needed.

## 2020-02-27 DIAGNOSIS — Z8673 Personal history of transient ischemic attack (TIA), and cerebral infarction without residual deficits: Secondary | ICD-10-CM | POA: Insufficient documentation

## 2020-02-27 NOTE — Assessment & Plan Note (Addendum)
Rationale for dual antiplatelet therapy followed by Plavix alone discussed with patient.  Reasonable to continue Lipitor for now.  No new neurologic symptoms.  Okay for outpatient follow-up.  He will follow-up with neurology.  He had appropriate imaging done as inpatient.  Continue statin as is for now.  Rationale discussed with patient.  He agrees to plan.  Flu vaccine done today.  Reasonable to recheck labs in about 1 month.  He agrees with plan.  He already has a loop recorder placed with rationale discussed with patient.

## 2020-03-01 ENCOUNTER — Ambulatory Visit: Payer: 59 | Admitting: Physical Therapy

## 2020-03-18 LAB — CUP PACEART REMOTE DEVICE CHECK
Date Time Interrogation Session: 20211107070348
Implantable Pulse Generator Implant Date: 20210928

## 2020-03-19 ENCOUNTER — Ambulatory Visit (INDEPENDENT_AMBULATORY_CARE_PROVIDER_SITE_OTHER): Payer: 59

## 2020-03-19 DIAGNOSIS — Z8673 Personal history of transient ischemic attack (TIA), and cerebral infarction without residual deficits: Secondary | ICD-10-CM

## 2020-03-20 NOTE — Progress Notes (Signed)
Carelink Summary Report / Loop Recorder 

## 2020-03-26 ENCOUNTER — Other Ambulatory Visit: Payer: Self-pay

## 2020-03-26 ENCOUNTER — Other Ambulatory Visit (INDEPENDENT_AMBULATORY_CARE_PROVIDER_SITE_OTHER): Payer: 59

## 2020-03-26 DIAGNOSIS — Z8673 Personal history of transient ischemic attack (TIA), and cerebral infarction without residual deficits: Secondary | ICD-10-CM | POA: Diagnosis not present

## 2020-03-26 LAB — CBC WITH DIFFERENTIAL/PLATELET
Basophils Absolute: 0 10*3/uL (ref 0.0–0.1)
Basophils Relative: 0.5 % (ref 0.0–3.0)
Eosinophils Absolute: 0.1 10*3/uL (ref 0.0–0.7)
Eosinophils Relative: 1.8 % (ref 0.0–5.0)
HCT: 46.4 % (ref 39.0–52.0)
Hemoglobin: 15.6 g/dL (ref 13.0–17.0)
Lymphocytes Relative: 18.1 % (ref 12.0–46.0)
Lymphs Abs: 1.1 10*3/uL (ref 0.7–4.0)
MCHC: 33.6 g/dL (ref 30.0–36.0)
MCV: 90.9 fl (ref 78.0–100.0)
Monocytes Absolute: 0.6 10*3/uL (ref 0.1–1.0)
Monocytes Relative: 10.3 % (ref 3.0–12.0)
Neutro Abs: 4.1 10*3/uL (ref 1.4–7.7)
Neutrophils Relative %: 69.3 % (ref 43.0–77.0)
Platelets: 191 10*3/uL (ref 150.0–400.0)
RBC: 5.11 Mil/uL (ref 4.22–5.81)
RDW: 13.9 % (ref 11.5–15.5)
WBC: 5.9 10*3/uL (ref 4.0–10.5)

## 2020-03-26 LAB — COMPREHENSIVE METABOLIC PANEL
ALT: 48 U/L (ref 0–53)
AST: 32 U/L (ref 0–37)
Albumin: 4.1 g/dL (ref 3.5–5.2)
Alkaline Phosphatase: 44 U/L (ref 39–117)
BUN: 17 mg/dL (ref 6–23)
CO2: 32 mEq/L (ref 19–32)
Calcium: 8.9 mg/dL (ref 8.4–10.5)
Chloride: 105 mEq/L (ref 96–112)
Creatinine, Ser: 1.03 mg/dL (ref 0.40–1.50)
GFR: 77.15 mL/min (ref >60.00)
Glucose, Bld: 83 mg/dL (ref 70–99)
Potassium: 4.3 mEq/L (ref 3.5–5.1)
Sodium: 141 mEq/L (ref 135–145)
Total Bilirubin: 0.9 mg/dL (ref 0.2–1.2)
Total Protein: 6.7 g/dL (ref 6.0–8.3)

## 2020-03-26 LAB — LIPID PANEL
Cholesterol: 84 mg/dL (ref 0–200)
HDL: 45.5 mg/dL (ref >39.00)
LDL Cholesterol: 28 mg/dL (ref 0–99)
NonHDL: 38.3
Total CHOL/HDL Ratio: 2
Triglycerides: 50 mg/dL (ref 0.0–149.0)
VLDL: 10 mg/dL (ref 0.0–40.0)

## 2020-04-21 LAB — CUP PACEART REMOTE DEVICE CHECK
Date Time Interrogation Session: 20211210070745
Implantable Pulse Generator Implant Date: 20210928

## 2020-04-23 ENCOUNTER — Ambulatory Visit (INDEPENDENT_AMBULATORY_CARE_PROVIDER_SITE_OTHER): Payer: 59

## 2020-04-23 DIAGNOSIS — Z8673 Personal history of transient ischemic attack (TIA), and cerebral infarction without residual deficits: Secondary | ICD-10-CM

## 2020-05-08 NOTE — Progress Notes (Signed)
Carelink Summary Report / Loop Recorder 

## 2020-05-28 ENCOUNTER — Ambulatory Visit (INDEPENDENT_AMBULATORY_CARE_PROVIDER_SITE_OTHER): Payer: 59

## 2020-05-28 DIAGNOSIS — I639 Cerebral infarction, unspecified: Secondary | ICD-10-CM

## 2020-05-29 LAB — CUP PACEART REMOTE DEVICE CHECK
Date Time Interrogation Session: 20220112070446
Implantable Pulse Generator Implant Date: 20210928

## 2020-05-30 ENCOUNTER — Telehealth: Payer: Self-pay

## 2020-05-30 NOTE — Telephone Encounter (Signed)
ILR summary report received. Battery status OK. Normal device function. No new symptom, brady, or pause episodes. No new AF episodes. 2 tachy events logged 1 minute 37 seconds and 3 minutes w/ rates 160's to 260's bpm; ECGs suggest VT.   Spoke with pt.  He believes at time of episodes on 12/15 and 12/21 he was running.  He does recall one time when he was running where he felt pretty lousy.  He reports after running he usually feels poor for a couple of minutes following.  He denies chest pain.    Information forwarded to MD via results note.

## 2020-06-11 NOTE — Progress Notes (Signed)
Carelink Summary Report / Loop Recorder 

## 2020-06-27 LAB — CUP PACEART REMOTE DEVICE CHECK
Date Time Interrogation Session: 20220214070545
Implantable Pulse Generator Implant Date: 20210928

## 2020-07-02 ENCOUNTER — Ambulatory Visit (INDEPENDENT_AMBULATORY_CARE_PROVIDER_SITE_OTHER): Payer: 59

## 2020-07-02 DIAGNOSIS — I639 Cerebral infarction, unspecified: Secondary | ICD-10-CM

## 2020-07-03 NOTE — Progress Notes (Signed)
Cardiology Office Note Date:  07/05/2020  Patient ID:  Jeffery, Willis 12/12/1955, MRN [id number] PCP:  Joaquim Nam, MD  Electrophysiologist: Dr. Johney Frame    Chief Complaint: abnormal remote  History of Present Illness: Jeffery Willis is a 65 y.o. male with history of anxiety, cryptogenic stroke prompting loop implant Sept 2021.  He comes in today to be seen for Dr. Johney Frame, last seen by him during his haopsital stay for stroke > loop implanted.  Device remote dated 05/30/20 noted WCT felt c/w VT and scheduled to come in for further evaluation.  TODAY He feels quite well. No CP, no SOB or DOE. Infrequently has an awareness of his heart beat feels strong, maybe fast, but bnot racing, ore of a feeling like is beating hard/strong, no pain Exercises somewhat intermittently when he can. Will jog/run on the treadmill.  The day of his VT episode on loop, when RN here called him , timing lines up with day he was at the gym, had run particularly hard on the treadmill and felt well doing it, but once done for a couple minutes felt poorly, weak.  Did not feel faint, no near syncope or syncope, and then just felt better.  He denies any hx of near syncope or syncope ever No family hx of SCD His Mom has Afib    Device information MDT linq II implanted 02/07/20, cryptogenic stroke   Past Medical History:  Diagnosis Date  . Anxiety   . History of cardiovascular stress test 2/07   normal myocardial perfusion without evidence of infarction of ischemia.  Given these  results and tthe patient's persistence of flipped T waves, I think these are likely asymmptomatic and  clinically insignificant findings  . Renal calculus    per [facility]  . Skin lesion     Past Surgical History:  Procedure Laterality Date  . 2 D Echo  06/2005   normal LV function and no LVH.  Normal 2D echowith ER60 to 70%  . BUBBLE STUDY  02/07/2020   Procedure: BUBBLE STUDY;  Surgeon: [name], MD;   Location: [facility] ENDOSCOPY;  Service: Cardiovascular;;  . KIDNEY STONE SURGERY    . LOOP RECORDER INSERTION N/A 02/07/2020   Procedure: LOOP RECORDER INSERTION;  Surgeon: Hillis Range, MD;  Location: [facility] INVASIVE CV LAB;  Service: Cardiovascular;  Laterality: N/A;  . TEE WITHOUT CARDIOVERSION N/A 02/07/2020   Procedure: TRANSESOPHAGEAL ECHOCARDIOGRAM (TEE);  Surgeon: [name], MD;  Location: [facility] ENDOSCOPY;  Service: Cardiovascular;  Laterality: N/A;    Current Outpatient Medications  Medication Sig Dispense Refill  . atorvastatin (LIPITOR) 40 MG tablet Take 1 tablet (40 mg total) by mouth daily. 90 tablet 3  . clopidogrel (PLAVIX) 75 MG tablet Take 75 mg by mouth daily.    Marland Kitchen escitalopram (LEXAPRO) 10 MG tablet Take 1 tablet (10 mg total) by mouth daily.     No current facility-administered medications for this visit.    Allergies:   Patient has no known allergies.   Social History:  The patient  reports that he has never smoked. He has never used smokeless tobacco. He reports that he does not drink alcohol and does not use drugs.   Family History:  The patient's family history includes Arthritis in his father; Heart disease in his mother; Hypertension in his father; Prostate cancer in his paternal grandfather.  ROS:  Please see the history of present illness.    All other systems are reviewed and otherwise negative.  PHYSICAL EXAM:  VS:  BP 138/72   Pulse 74   Ht 6\' 1"  (1.854 m)   Wt 201 lb (91.2 kg)   SpO2 97%   BMI 26.52 kg/m  BMI: Body mass index is 26.52 kg/m. Well nourished, well developed, in no acute distress HEENT: normocephalic, atraumatic Neck: no JVD, carotid bruits or masses Cardiac:  RRR; no significant murmurs, no rubs, or gallops Lungs:  CTA b/l, no wheezing, rhonchi or rales Abd: soft, nontender MS: no deformity or atrophy Ext:  no edema Skin: warm and dry, no rash Neuro:  No gross deficits appreciated Psych: euthymic mood, full affect  ILR site is  stable, no tethering or discomfort   EKG:  Done today and reviewed by myself shows  SR 65bpm, , PVC, no new ST/T changes  Device interrogation done today and reviewed by myself:  Battery  Is good R 0.58mV, SR 5 tachy episodes 4 are ST The last event was 05/01/20, 16:19 Looks SR > WCT > ST  02/07/20; TEE IMPRESSIONS  1. Left ventricular ejection fraction, by estimation, is 60 to 65%. The  left ventricle has normal function. The left ventricle has no regional  wall motion abnormalities.  2. Right ventricular systolic function is normal. The right ventricular  size is normal.  3. No left atrial/left atrial appendage thrombus was detected.  4. The mitral valve is normal in structure. Trivial mitral valve  regurgitation. No evidence of mitral stenosis.  5. The aortic valve is tricuspid. Aortic valve regurgitation is not  visualized. No aortic stenosis is present.  6. Agitated saline contrast bubble study was negative, with no evidence  of any interatrial shunt.   Conclusion(s)/Recommendation(s): Normal biventricular function without  evidence of hemodynamically significant valvular heart disease.     02/06/20: TTE IMPRESSIONS  1. Left ventricular ejection fraction, by estimation, is 65 to 70%. The  left ventricle has normal function. The left ventricle has no regional  wall motion abnormalities. There is mild concentric left ventricular  hypertrophy. Left ventricular diastolic  parameters were normal.  2. Right ventricular systolic function is normal. The right ventricular  size is normal.  3. The mitral valve is normal in structure. No evidence of mitral valve  regurgitation. No evidence of mitral stenosis.  4. The aortic valve is normal in structure. Aortic valve regurgitation is  not visualized. No aortic stenosis is present.  5. The inferior vena cava is normal in size with greater than 50%  respiratory variability, suggesting right atrial pressure of 3 mmHg.    Comparison(s): No prior Echocardiogram.    Recent Labs: 02/06/2020: TSH 1.586 03/26/2020: ALT 48; BUN 17; Creatinine, Ser 1.03; Hemoglobin 15.6; Platelets 191.0; Potassium 4.3; Sodium 141  03/26/2020: Cholesterol 84; HDL 45.50; LDL Cholesterol 28; Total CHOL/HDL Ratio 2; Triglycerides 50.0; VLDL 10.0   CrCl cannot be calculated (Patient's most recent lab result is older than the maximum 21 days allowed.).   Wt Readings from Last 3 Encounters:  07/05/20 201 lb (91.2 kg)  02/24/20 196 lb 6 oz (89.1 kg)  02/06/20 190 lb (86.2 kg)     Other studies reviewed: Additional studies/records reviewed today include: summarized above  ASSESSMENT AND PLAN:  1. Cryptogenic stroke     ILR     No programming changes made     No Afib to date  2. WCT     Was VERY fast, 231bpm and appears c/w VT     Had been runnig hard on the treadmill, felt well during  exercise though when he stopped felt poorly/weak for a few minutes     No CP, no syncope.  Plan BMEt and mag level Exercise myoview Discussed with DOD, he agrees with plan.  I discussed stress testing procedure with the patient, potential risks/benefots, he is agreeable to proceed.  Advised the patient not to exercise to that extent, recommended his peak exercise HR should be in the 120's   Disposition: F/u in a month, sooner if needed  Current medicines are reviewed at length with the patient today.  The patient did not have any concerns regarding medicines.  Norma Fredrickson, PA-C 07/05/2020 10:38 AM     [facility] [address] [place] [address] [phone] (office)  [phone] (fax)

## 2020-07-05 ENCOUNTER — Other Ambulatory Visit: Payer: Self-pay

## 2020-07-05 ENCOUNTER — Encounter: Payer: Self-pay | Admitting: Physician Assistant

## 2020-07-05 ENCOUNTER — Ambulatory Visit: Payer: 59 | Admitting: Physician Assistant

## 2020-07-05 VITALS — BP 138/72 | HR 74 | Ht 73.0 in | Wt 201.0 lb

## 2020-07-05 DIAGNOSIS — Z4509 Encounter for adjustment and management of other cardiac device: Secondary | ICD-10-CM | POA: Diagnosis not present

## 2020-07-05 DIAGNOSIS — I472 Ventricular tachycardia, unspecified: Secondary | ICD-10-CM

## 2020-07-05 DIAGNOSIS — I639 Cerebral infarction, unspecified: Secondary | ICD-10-CM

## 2020-07-05 LAB — CUP PACEART INCLINIC DEVICE CHECK
Date Time Interrogation Session: 20220224124619
Implantable Pulse Generator Implant Date: 20210928

## 2020-07-05 LAB — BASIC METABOLIC PANEL
BUN/Creatinine Ratio: 18 (ref 10–24)
BUN: 18 mg/dL (ref 8–27)
CO2: 25 mmol/L (ref 20–29)
Calcium: 9.5 mg/dL (ref 8.6–10.2)
Chloride: 103 mmol/L (ref 96–106)
Creatinine, Ser: 0.99 mg/dL (ref 0.76–1.27)
GFR calc Af Amer: 93 mL/min/{1.73_m2} (ref >59)
GFR calc non Af Amer: 80 mL/min/{1.73_m2} (ref >59)
Glucose: 112 mg/dL — ABNORMAL HIGH (ref 65–99)
Potassium: 4.7 mmol/L (ref 3.5–5.2)
Sodium: 141 mmol/L (ref 134–144)

## 2020-07-05 LAB — MAGNESIUM: Magnesium: 2.1 mg/dL (ref 1.6–2.3)

## 2020-07-05 NOTE — Patient Instructions (Addendum)
Medication Instructions:   Your physician recommends that you continue on your current medications as directed. Please refer to the Current Medication list given to you today.  *If you need a refill on your cardiac medications before your next appointment, please call your pharmacy*  Lab Work: BMET AND MAG   If you have labs (blood work) drawn today and your tests are completely normal, you will receive your results only by: Marland Kitchen MyChart Message (if you have MyChart) OR . A paper copy in the mail If you have any lab test that is abnormal or we need to change your treatment, we will call you to review the results.   Testing/Procedures: Your physician has requested that you have en exercise stress myoview. For further information please visit [email or web address] Please follow instruction sheet, as given.   Follow-Up: At [facility], you and your health needs are our priority.  As part of our continuing mission to provide you with exceptional heart care, we have created designated Provider Care Teams.  These Care Teams include your primary Cardiologist (physician) and Advanced Practice Providers (APPs -  Physician Assistants and Nurse Practitioners) who all work together to provide you with the care you need, when you need it.  We recommend signing up for the patient portal called "MyChart".  Sign up information is provided on this After Visit Summary.  MyChart is used to connect with patients for Virtual Visits (Telemedicine).  Patients are able to view lab/test results, encounter notes, upcoming appointments, etc.  Non-urgent messages can be sent to your provider as well.   To learn more about what you can do with MyChart, go to [email or web address].    Your next appointment:   3-4 week(s)  The format for your next appointment:   In Person  Provider:   Hillis Range, MD   Other Instructions

## 2020-07-05 NOTE — Addendum Note (Signed)
Addended by: Oleta Mouse on: 07/05/2020 11:23 AM   Modules accepted: Orders

## 2020-07-09 NOTE — Progress Notes (Signed)
Carelink Summary Report / Loop Recorder 

## 2020-07-18 ENCOUNTER — Telehealth (HOSPITAL_COMMUNITY): Payer: Self-pay | Admitting: *Deleted

## 2020-07-18 NOTE — Telephone Encounter (Signed)
Patient given detailed instructions per Myocardial Perfusion Study Information Sheet for the test on 07/23/20 at 10:30. Patient notified to arrive 15 minutes early and that it is imperative to arrive on time for appointment to keep from having the test rescheduled.  If you need to cancel or reschedule your appointment, please call the office within 24 hours of your appointment. . Patient verbalized understanding.Jeffery Willis

## 2020-07-19 ENCOUNTER — Other Ambulatory Visit (HOSPITAL_COMMUNITY)
Admission: RE | Admit: 2020-07-19 | Discharge: 2020-07-19 | Disposition: A | Discharge status: Home / Self Care | Payer: 59 | Source: Ambulatory Visit | Attending: Physician Assistant | Admitting: Physician Assistant

## 2020-07-19 DIAGNOSIS — Z20822 Contact with and (suspected) exposure to covid-19: Secondary | ICD-10-CM | POA: Diagnosis not present

## 2020-07-19 DIAGNOSIS — Z01812 Encounter for preprocedural laboratory examination: Secondary | ICD-10-CM | POA: Insufficient documentation

## 2020-07-19 LAB — SARS CORONAVIRUS 2 (TAT 6-24 HRS): SARS Coronavirus 2: NEGATIVE

## 2020-07-23 ENCOUNTER — Ambulatory Visit (HOSPITAL_COMMUNITY): Payer: 59 | Attending: Cardiology

## 2020-07-25 ENCOUNTER — Encounter (HOSPITAL_COMMUNITY): Payer: 59

## 2020-08-01 ENCOUNTER — Inpatient Hospital Stay (HOSPITAL_COMMUNITY): Admission: RE | Admit: 2020-08-01 | Payer: 59 | Source: Ambulatory Visit

## 2020-08-02 ENCOUNTER — Encounter: Payer: Self-pay | Admitting: Family Medicine

## 2020-08-02 ENCOUNTER — Ambulatory Visit: Payer: 59 | Admitting: Family Medicine

## 2020-08-02 ENCOUNTER — Telehealth (HOSPITAL_COMMUNITY): Payer: Self-pay | Admitting: *Deleted

## 2020-08-02 ENCOUNTER — Other Ambulatory Visit: Payer: Self-pay

## 2020-08-02 ENCOUNTER — Other Ambulatory Visit (HOSPITAL_COMMUNITY)
Admission: RE | Admit: 2020-08-02 | Discharge: 2020-08-02 | Disposition: A | Discharge status: Home / Self Care | Payer: 59 | Source: Ambulatory Visit | Attending: Family Medicine | Admitting: Family Medicine

## 2020-08-02 VITALS — BP 140/80 | HR 71 | Temp 97.8°F | Ht 73.0 in | Wt 198.0 lb

## 2020-08-02 DIAGNOSIS — Z01812 Encounter for preprocedural laboratory examination: Secondary | ICD-10-CM | POA: Insufficient documentation

## 2020-08-02 DIAGNOSIS — R413 Other amnesia: Secondary | ICD-10-CM

## 2020-08-02 DIAGNOSIS — Z20822 Contact with and (suspected) exposure to covid-19: Secondary | ICD-10-CM | POA: Diagnosis not present

## 2020-08-02 LAB — CBC WITH DIFFERENTIAL/PLATELET
Basophils Absolute: 0 10*3/uL (ref 0.0–0.1)
Basophils Relative: 0.6 % (ref 0.0–3.0)
Eosinophils Absolute: 0.1 10*3/uL (ref 0.0–0.7)
Eosinophils Relative: 1.2 % (ref 0.0–5.0)
HCT: 45.1 % (ref 39.0–52.0)
Hemoglobin: 15.3 g/dL (ref 13.0–17.0)
Lymphocytes Relative: 16.8 % (ref 12.0–46.0)
Lymphs Abs: 1.1 10*3/uL (ref 0.7–4.0)
MCHC: 34 g/dL (ref 30.0–36.0)
MCV: 90.3 fl (ref 78.0–100.0)
Monocytes Absolute: 0.6 10*3/uL (ref 0.1–1.0)
Monocytes Relative: 8.9 % (ref 3.0–12.0)
Neutro Abs: 4.8 10*3/uL (ref 1.4–7.7)
Neutrophils Relative %: 72.5 % (ref 43.0–77.0)
Platelets: 203 10*3/uL (ref 150.0–400.0)
RBC: 5 Mil/uL (ref 4.22–5.81)
RDW: 13.8 % (ref 11.5–15.5)
WBC: 6.6 10*3/uL (ref 4.0–10.5)

## 2020-08-02 LAB — VITAMIN B12: Vitamin B-12: 612 pg/mL (ref 211–911)

## 2020-08-02 LAB — COMPREHENSIVE METABOLIC PANEL
ALT: 31 U/L (ref 0–53)
AST: 23 U/L (ref 0–37)
Albumin: 4.4 g/dL (ref 3.5–5.2)
Alkaline Phosphatase: 52 U/L (ref 39–117)
BUN: 17 mg/dL (ref 6–23)
CO2: 34 mEq/L — ABNORMAL HIGH (ref 19–32)
Calcium: 9.1 mg/dL (ref 8.4–10.5)
Chloride: 105 mEq/L (ref 96–112)
Creatinine, Ser: 1 mg/dL (ref 0.40–1.50)
GFR: 79.74 mL/min (ref >60.00)
Glucose, Bld: 97 mg/dL (ref 70–99)
Potassium: 4 mEq/L (ref 3.5–5.1)
Sodium: 142 mEq/L (ref 135–145)
Total Bilirubin: 0.7 mg/dL (ref 0.2–1.2)
Total Protein: 6.5 g/dL (ref 6.0–8.3)

## 2020-08-02 LAB — TSH: TSH: 1.21 u[IU]/mL (ref 0.35–4.50)

## 2020-08-02 LAB — SARS CORONAVIRUS 2 (TAT 6-24 HRS): SARS Coronavirus 2: NEGATIVE

## 2020-08-02 NOTE — Telephone Encounter (Signed)
Left message on voicemail in reference to upcoming appointment scheduled for 08/07/31. Phone number given for a call back so details instructions can be given. Daneil Dolin

## 2020-08-02 NOTE — Patient Instructions (Addendum)
I think it makes sense to get through the stress test prior to making any changes.   Go to the lab on the way out.   If you have mychart we'll likely use that to update you.    Take care.  Glad to see you. I'll update Dr. Pearlean Brownie.  It may be the case where either I or Dr. Pearlean Brownie can send you for more intensive memory testing.

## 2020-08-02 NOTE — Progress Notes (Signed)
This visit occurred during the SARS-CoV-2 public health emergency.  Safety protocols were in place, including screening questions prior to the visit, additional usage of staff PPE, and extensive cleaning of exam room while observing appropriate contact time as indicated for disinfecting solutions.  Family noted changes in the patient: headaches, fatigue, disorientation, changes in reasoning, distracted more easily, changes in personality.  He had a text from family about the sx noted above.    He noted changes in reasoning- getting to the right answer but slower than previous baseline, it may take longer to think of directions with driving.  He has occ HA.  Some fatigue noted.  He is semi-retired, working part time doing relief in multiple stores.    H/o CVA noted.  He has noted occ slowing brand/generic recall in the pharmacy but he doesn't know of errors at work. He knows that he has to concentrate more at work.    No MVA.  Not getting lost.  No specific red flag events.  He has stress test pending per cardiology.  Still on plavix and statin at baseline.  Still on lexapro at baseline.    Meds, vitals, and allergies reviewed.   ROS: Per HPI unless specifically indicated in ROS section   GEN: nad, alert and oriented HEENT: ncat NECK: supple w/o LA CV: rrr PULM: ctab, no inc wob ABD: soft, +bs EXT: no edema SKIN: well perfused.   CN 2-12 wnl B, S/S wnl x4  MMSE 30/30.    Prev MRI d/w pt.   IMPRESSION: Unchanged T2 hyperintensity and swelling of the left hippocampal tail without corresponding abnormal enhancement. Homogeneous restricted diffusion was present at this site on the brain MRI of 02/05/2020 and this likely reflects an acute infarct. However, 4-6 week MRI follow-up should be considered to exclude alternative etiologies (i.e. hippocampal mass).  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  32 minutes were devoted to  patient care in this encounter (this includes time spent reviewing the patient's file/history, interviewing and examining the patient, counseling/reviewing plan with patient).

## 2020-08-04 LAB — CUP PACEART REMOTE DEVICE CHECK
Date Time Interrogation Session: 20220319070652
Implantable Pulse Generator Implant Date: 20210928

## 2020-08-05 ENCOUNTER — Telehealth: Payer: Self-pay | Admitting: Family Medicine

## 2020-08-05 DIAGNOSIS — R413 Other amnesia: Secondary | ICD-10-CM | POA: Insufficient documentation

## 2020-08-05 NOTE — Assessment & Plan Note (Addendum)
I think it makes sense to get through his stress test before we do anything else.  He agrees.  Check routine labs today given his memory change.  Still okay for outpatient follow-up.  30 out of 30 on MMSE and no red flag events.  I'll update Dr. Pearlean Brownie.  It may be the case where either I or Dr. Pearlean Brownie can send him for neurocognitive testing.  Patient agrees with plan.

## 2020-08-05 NOTE — Telephone Encounter (Signed)
I need input from Dr. Pearlean Brownie- Please see my office visit note.  Patient has noted a memory change.  We thought it made sense for him to get through his pending cardiac stress test and then consider neuropsychological evaluation regarding his memory changes.  His lab work-up was unremarkable.  If you would prefer to see the patient in clinic before additional testing, then I am fine with that.  If you want me to go ahead and put in the referral for neuropsychological testing please let me know.  Thank you.

## 2020-08-06 ENCOUNTER — Ambulatory Visit (INDEPENDENT_AMBULATORY_CARE_PROVIDER_SITE_OTHER): Payer: 59

## 2020-08-06 ENCOUNTER — Ambulatory Visit (HOSPITAL_COMMUNITY): Payer: 59 | Attending: Physician Assistant

## 2020-08-06 ENCOUNTER — Other Ambulatory Visit: Payer: Self-pay

## 2020-08-06 DIAGNOSIS — I472 Ventricular tachycardia, unspecified: Secondary | ICD-10-CM

## 2020-08-06 DIAGNOSIS — Z8673 Personal history of transient ischemic attack (TIA), and cerebral infarction without residual deficits: Secondary | ICD-10-CM | POA: Diagnosis not present

## 2020-08-06 LAB — MYOCARDIAL PERFUSION IMAGING
Estimated workload: 9.2 METS
Exercise duration (min): 7 min
Exercise duration (sec): 30 s
LV dias vol: 100 mL (ref 62–150)
LV sys vol: 45 mL
MPHR: 156 {beats}/min
Peak HR: 150 {beats}/min
Percent HR: 96 %
Rest HR: 67 {beats}/min
SDS: 1
SRS: 0
SSS: 1
TID: 1.07

## 2020-08-06 MED ORDER — TECHNETIUM TC 99M TETROFOSMIN IV KIT
10.9000 | PACK | Freq: Once | INTRAVENOUS | Status: AC | PRN
  Administered 2020-08-06: 10.9 via INTRAVENOUS
  Filled 2020-08-06: qty 11

## 2020-08-06 MED ORDER — TECHNETIUM TC 99M TETROFOSMIN IV KIT
30.7000 | PACK | Freq: Once | INTRAVENOUS | Status: AC | PRN
  Administered 2020-08-06: 30.7 via INTRAVENOUS
  Filled 2020-08-06: qty 31

## 2020-08-06 NOTE — Telephone Encounter (Signed)
Cheree Ditto I agree with your concern.  Kindly put in for neuropsych referral.  Patient can call my office and make an appointment after his neuropsych testing to discuss treatment options

## 2020-08-08 ENCOUNTER — Ambulatory Visit: Payer: 59 | Admitting: Internal Medicine

## 2020-08-08 ENCOUNTER — Other Ambulatory Visit: Payer: Self-pay

## 2020-08-08 ENCOUNTER — Encounter: Payer: Self-pay | Admitting: Internal Medicine

## 2020-08-08 VITALS — BP 164/82 | HR 79 | Ht 73.0 in | Wt 199.8 lb

## 2020-08-08 DIAGNOSIS — I472 Ventricular tachycardia, unspecified: Secondary | ICD-10-CM

## 2020-08-08 DIAGNOSIS — R000 Tachycardia, unspecified: Secondary | ICD-10-CM

## 2020-08-08 DIAGNOSIS — I639 Cerebral infarction, unspecified: Secondary | ICD-10-CM

## 2020-08-08 MED ORDER — METOPROLOL SUCCINATE ER 25 MG PO TB24: 25.0000 mg | ORAL_TABLET | Freq: Every day | ORAL | 3 refills | Status: DC

## 2020-08-08 NOTE — Telephone Encounter (Signed)
See below and please update patient.  I put in the referral for neuropsychological testing.  Thanks.

## 2020-08-08 NOTE — Progress Notes (Signed)
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  Since last being seen in our clinic, the patient reports doing very well.  Today, he denies symptoms of palpitations, chest pain, shortness of breath,  lower extremity edema, dizziness, presyncope, or syncope.  The patient is otherwise without complaint today.   Past Medical History:  Diagnosis Date  . Anxiety   . History of cardiovascular stress test 2/07   normal myocardial perfusion without evidence of infarction of ischemia.  Given these  results and tthe patient's persistence of flipped T waves, I think these are likely asymmptomatic and  clinically insignificant findings  . Renal calculus    per [facility]  . Skin lesion    Past Surgical History:  Procedure Laterality Date  . 2 D Echo  06/2005   normal LV function and no LVH.  Normal 2D echowith ER60 to 70%  . BUBBLE STUDY  02/07/2020   Procedure: BUBBLE STUDY;  Surgeon: Jeffery Si, MD;  Location: [facility] ENDOSCOPY;  Service: Cardiovascular;;  . KIDNEY STONE SURGERY    . LOOP RECORDER INSERTION N/A 02/07/2020   Procedure: LOOP RECORDER INSERTION;  Surgeon: Jeffery Range, MD;  Location: [facility] INVASIVE CV LAB;  Service: Cardiovascular;  Laterality: N/A;  . TEE WITHOUT CARDIOVERSION N/A 02/07/2020   Procedure: TRANSESOPHAGEAL ECHOCARDIOGRAM (TEE);  Surgeon: Jeffery Si, MD;  Location: [facility] ENDOSCOPY;  Service: Cardiovascular;  Laterality: N/A;    ROS- all systems are reviewed and negatives except as per HPI above  Current Outpatient Medications  Medication Sig Dispense Refill  . clopidogrel (PLAVIX) 75 MG tablet Take 75 mg by mouth daily.    Marland Kitchen escitalopram (LEXAPRO) 10 MG tablet Take 1 tablet (10 mg total) by mouth daily.    Marland Kitchen atorvastatin (LIPITOR) 40 MG tablet Take 1 tablet (40 mg total) by mouth daily. 90 tablet 3   No current facility-administered medications for this visit.     Physical Exam: Vitals:   08/08/20 1037  BP: (!) 164/82  Pulse: 79  SpO2: 96%  Weight: 199 lb 12.8 oz (90.6 kg)  Height: 6\' 1"  (1.854 m)    GEN- The patient is well appearing, alert and oriented x 3 today.   Head- normocephalic, atraumatic Eyes-  Sclera clear, conjunctiva pink Ears- hearing intact Oropharynx- clear Lungs- Clear to ausculation bilaterally, normal work of breathing Heart- Regular rate and rhythm, no murmurs, rubs or gallops, PMI not laterally displaced GI- soft, NT, ND, + BS Extremities- no clubbing, cyanosis, or edema  Wt Readings from Last 3 Encounters:  08/08/20 199 lb 12.8 oz (90.6 kg)  08/06/20 201 lb (91.2 kg)  08/02/20 198 lb (89.8 kg)    EKG tracing ordered today is personally reviewed and shows sinus rhythm  myoview 08/06/20- normal perfusion, EF 55%  Echo 02/06/20- normal EF,  Normal RV function  Assessment and Plan:  1. Wide complex tachycardia Likely VT without hemodynamic instability Normal echo and myoview Normal ekg Continue to monitor  No medicine changes today  2. Cryptogenic stroke Continue to monitor with ILR Disorganized atrial activity noted 07/26/20.  Continue to monitor.  3. HTN Hypertensive response to exercise also noted Start toprol 25mg  daily, which should also help with PACs and WCT.  Risks, benefits and potential toxicities for medications prescribed and/or refilled reviewed with patient today.   Return to see EP PA in 6 months  07/28/20 MD, [facility] 08/08/2020 11:03  AM     

## 2020-08-08 NOTE — Patient Instructions (Addendum)
Medication Instructions:  Start Metoprolol succinate 25 mg daily Your physician recommends that you continue on your current medications as directed. Please refer to the Current Medication list given to you today.  Labwork: None ordered.  Testing/Procedures: None ordered.  Follow-Up: Your physician wants you to follow-up in: 6 months with    Francis Dowse, PA-C     You will receive a reminder letter in the mail two months in advance. If you don't receive a letter, please call our office to schedule the follow-up appointment.  Any Other Special Instructions Will Be Listed Below (If Applicable).  If you need a refill on your cardiac medications before your next appointment, please call your pharmacy.

## 2020-08-09 NOTE — Telephone Encounter (Signed)
Spoke with patient and notified patient about referral for neuropsy testing and he is okay with this. Advised patient he will get a call to schedule from the office that will be doing the testing.

## 2020-08-17 NOTE — Progress Notes (Signed)
Carelink Summary Report / Loop Recorder 

## 2020-08-30 ENCOUNTER — Ambulatory Visit (INDEPENDENT_AMBULATORY_CARE_PROVIDER_SITE_OTHER): Payer: 59

## 2020-08-30 DIAGNOSIS — Z8673 Personal history of transient ischemic attack (TIA), and cerebral infarction without residual deficits: Secondary | ICD-10-CM

## 2020-08-30 LAB — CUP PACEART REMOTE DEVICE CHECK
Date Time Interrogation Session: 20220421070404
Implantable Pulse Generator Implant Date: 20210928

## 2020-09-04 ENCOUNTER — Other Ambulatory Visit: Payer: Self-pay | Admitting: Family

## 2020-09-04 ENCOUNTER — Telehealth: Payer: Self-pay | Admitting: Family Medicine

## 2020-09-04 DIAGNOSIS — R413 Other amnesia: Secondary | ICD-10-CM

## 2020-09-04 DIAGNOSIS — Z8673 Personal history of transient ischemic attack (TIA), and cerebral infarction without residual deficits: Secondary | ICD-10-CM

## 2020-09-04 NOTE — Telephone Encounter (Signed)
Pt called in due to he was given a referral to [facility] and he called and they told him that the doctors office has to be the one to call and set up the appointment

## 2020-09-06 ENCOUNTER — Telehealth: Payer: Self-pay | Admitting: Family Medicine

## 2020-09-06 DIAGNOSIS — R413 Other amnesia: Secondary | ICD-10-CM

## 2020-09-06 NOTE — Telephone Encounter (Signed)
[facility] called and stated that they do not do the neuro-psych test. But [facility] does the testing.

## 2020-09-09 NOTE — Telephone Encounter (Addendum)
I put in the referral.  See below.  Thanks.

## 2020-09-09 NOTE — Addendum Note (Signed)
Addended by: Joaquim Nam on: 09/09/2020 12:00 PM   Modules accepted: Orders

## 2020-09-10 NOTE — Telephone Encounter (Signed)
This is already been addressed and [facility] left messages for the patient. Duplicate referral closed.

## 2020-09-12 ENCOUNTER — Encounter: Payer: Self-pay | Admitting: Psychology

## 2020-09-18 NOTE — Progress Notes (Signed)
Carelink Summary Report / Loop Recorder 

## 2020-09-25 NOTE — Addendum Note (Signed)
Addended by: Geralyn Flash D on: 09/25/2020 03:18 PM   Modules accepted: Level of Service

## 2020-10-02 ENCOUNTER — Ambulatory Visit (INDEPENDENT_AMBULATORY_CARE_PROVIDER_SITE_OTHER): Payer: 59

## 2020-10-02 DIAGNOSIS — Z8673 Personal history of transient ischemic attack (TIA), and cerebral infarction without residual deficits: Secondary | ICD-10-CM

## 2020-10-02 LAB — CUP PACEART REMOTE DEVICE CHECK
Date Time Interrogation Session: 20220524070402
Implantable Pulse Generator Implant Date: 20210928

## 2020-10-09 ENCOUNTER — Inpatient Hospital Stay: Payer: Self-pay | Admitting: Neurology

## 2020-10-23 ENCOUNTER — Encounter: Payer: Self-pay | Admitting: Neurology

## 2020-10-23 ENCOUNTER — Ambulatory Visit: Payer: 59 | Admitting: Neurology

## 2020-10-23 ENCOUNTER — Other Ambulatory Visit: Payer: Self-pay | Admitting: Neurology

## 2020-10-23 VITALS — BP 137/85 | HR 57 | Ht 73.0 in | Wt 199.4 lb

## 2020-10-23 DIAGNOSIS — G3184 Mild cognitive impairment, so stated: Secondary | ICD-10-CM

## 2020-10-23 DIAGNOSIS — R413 Other amnesia: Secondary | ICD-10-CM | POA: Diagnosis not present

## 2020-10-23 NOTE — Progress Notes (Signed)
[facility] [address] [place]. [place] [address] [phone]       OFFICE CONSULT NOTE  [name] Date of Birth:  1955/07/19 Medical Record Number:  [id number]   Referring MD: Jeffery Willis  Reason for Referral: Memory loss  HPI: [name] is a pleasant 8 Caucasian male seen today for office consultation visit for memory loss.  History is obtained from the patient and review of electronic medical records and personally reviewed available imaging films in PACS.  He has no significant past medical history except left PCA branch infarct of cryptogenic etiology in September 2021 with practically no significant residual deficits except right superior quadrantic vision loss.  He has noticed subjective memory difficulties for the last few months.  He states he has to at times think where he is going and has to concentrate harder to remember things.  He forgets generic names of medications which he has been prescribing for years as he works as a Teacher, early years/pre.  However if he thinks are enough it will come back.  Is still able to work and works to 3 days a week.  He is managing all his affairs independently however his wife is more concerned about his forgetfulness and short-term memory difficulties.  He denies any headaches, loss of consciousness, seizure, significant head injury.  He does have history of left medial temporal infarct in September 2021 of cryptogenic etiology.  He had a loop recorder placed and so far A. fib has not been found.  He had mild residual right upper quadrant vision loss which still persists.  He has some short-term memory difficulties following the stroke but had shown improvement in this recent worsening symptoms new.  He has had no recent brain imaging study done.  He has a referral to neuropsychologist and has an appointment next month.  He has no family history of Alzheimer's or dementia.  His dad had Parkinson's disease.  ROS:   14 system  review of systems is positive for memory loss, word finding difficulties only all other systems negative forgetfulness,  PMH:  Past Medical History:  Diagnosis Date   Anxiety    History of cardiovascular stress test 06/2005   normal myocardial perfusion without evidence of infarction of ischemia.  Given these  results and tthe patient's persistence of flipped T waves, I think these are likely asymmptomatic and  clinically insignificant findings   Renal calculus    per [facility]   Skin lesion    Stroke [facility])     Social History:  Social History   Socioeconomic History   Marital status: Married    Spouse name: Not on file   Number of children: 2   Years of education: Not on file   Highest education level: Not on file  Occupational History   Occupation: Teacher, early years/pre, Psychiatrist school, works Engineering geologist in [employer]  Tobacco Use   Smoking status: Never   Smokeless tobacco: Never  Vaping Use   Vaping Use: Never used  Substance and Sexual Activity   Alcohol use: No   Drug use: No   Sexual activity: Not on file  Other Topics Concern   Not on file  Social History Narrative   Lives with wife   Right Handed   Drinks 3-4 cups caffeine daily   Social Determinants of Health   Financial Resource Strain: Not on file  Food Insecurity: Not on file  Transportation Needs: Not on file  Physical Activity: Not on file  Stress: Not  on file  Social Connections: Not on file  Intimate Partner Violence: Not on file    Medications:   Current Outpatient Medications on File Prior to Visit  Medication Sig Dispense Refill   clopidogrel (PLAVIX) 75 MG tablet Take 75 mg by mouth daily.     escitalopram (LEXAPRO) 10 MG tablet Take 1 tablet (10 mg total) by mouth daily.     metoprolol succinate (TOPROL XL) 25 MG 24 hr tablet Take 1 tablet (25 mg total) by mouth daily. 90 tablet 3   atorvastatin (LIPITOR) 40 MG tablet Take 1 tablet (40 mg total) by mouth daily. 90 tablet 3   No current  facility-administered medications on file prior to visit.    Allergies:  No Known Allergies  Physical Exam General: well developed, well nourished, pleasant middle-age Caucasian male seated, in no evident distress Head: head normocephalic and atraumatic.   Neck: supple with no carotid or supraclavicular bruits Cardiovascular: regular rate and rhythm, no murmurs Musculoskeletal: no deformity Skin:  no rash/petichiae Vascular:  Normal pulses all extremities  Neurologic Exam Mental Status: Awake and fully alert. Oriented to place and time. Recent and remote memory intact. Attention span, concentration and fund of knowledge appropriate. Mood and affect appropriate.  On Montreal cognitive assessment he scored 28/30 with 2 deficits in delayed recall only.  He was able to name 15 animals which can walk on 4 legs.  Clock drawing score was 4/4.  On geriatric depression scale score 2 which is not suggestive of depression. Cranial Nerves: Fundoscopic exam reveals sharp disc margins. Pupils equal, briskly reactive to light. Extraocular movements full without nystagmus. Visual fields show partial right superior quadrantanopsia to confrontation. Hearing intact. Facial sensation intact. Face, tongue, palate moves normally and symmetrically.  Motor: Normal bulk and tone. Normal strength in all tested extremity muscles. Sensory.: intact to touch , pinprick , position and vibratory sensation.  Coordination: Rapid alternating movements normal in all extremities. Finger-to-nose and heel-to-shin performed accurately bilaterally. Gait and Station: Arises from chair without difficulty. Stance is normal. Gait demonstrates normal stride length and balance . Able to heel, toe and tandem walk without difficulty.  Reflexes: 1+ and symmetric. Toes downgoing.   NIHSS  1 Modified Rankin  1    ASSESSMENT: 65 year old Caucasian male with subacute short-term memory and mild cognitive difficulties likely from mild  cognitive impairment.  Remote history of left medial temporal infarct in September 2021 of cryptogenic etiology.  Vascular risk factors of hyperlipidemia hypertension and prior stroke.     PLAN:I had a long discussion with the patient regarding his mild memory difficulties and discussed mild cognitive impairment as well as his remote stroke and answered questions.  I recommend further evaluation with checking MRI scan of the brain to look for interval new strokes as well as EEG for silent seizures and lab work for reversible causes of memory loss.  I encouraged him to do memory compensation strategies as well as increase participation in cognitively challenging activities like solving crossword puzzles, playing bridge and sodoku.  Continue Plavix for secondary stroke prevention and maintain aggressive risk factor modification with strict control of hypertension with blood pressure goal below 140/90, lipids with LDL cholesterol goal below 70 mg percent and diabetes with hemoglobin A1c goal below 6.5%.  He will keep his upcoming appointment for neuropsychological testing and return for follow-up to see me in 2 months or call earlier if necessary.  Greater than 50% time during this 45-minute consultation visit was spent in counseling and  coordination of care about his memory loss and mild cognitive impairment and remote stroke and answering questions.  Delia Heady, MD Note: This document was prepared with digital dictation and possible smart phrase technology. Any transcriptional errors that result from this process are unintentional.

## 2020-10-23 NOTE — Patient Instructions (Addendum)
I had a long discussion with the patient regarding his mild memory difficulties and discussed mild cognitive impairment as well as his remote stroke and answered questions.  I recommend further evaluation with checking MRI scan of the brain to look for interval new strokes as well as EEG for silent seizures and lab work for reversible causes of memory loss.  I encouraged him to do memory compensation strategies as well as increase participation in cognitively challenging activities like solving crossword puzzles, playing bridge and sodoku.  Continue Plavix for secondary stroke prevention and maintain aggressive risk factor modification with strict control of hypertension with blood pressure goal below 140/90, lipids with LDL cholesterol goal below 70 mg percent and diabetes with hemoglobin A1c goal below 6.5%.  He will keep his upcoming appointment for neuropsychological testing and return for follow-up to see me in 2 months or call earlier if necessary. Memory Compensation Strategies  Use "WARM" strategy.  W= write it down  A= associate it  R= repeat it  M= make a mental note  2.   You can keep a Glass blower/designer.  Use a 3-ring notebook with sections for the following: calendar, important names and phone numbers,  medications, doctors' names/phone numbers, lists/reminders, and a section to journal what you did  each day.   3.    Use a calendar to write appointments down.  4.    Write yourself a schedule for the day.  This can be placed on the calendar or in a separate section of the Memory Notebook.  Keeping a  regular schedule can help memory.  5.    Use medication organizer with sections for each day or morning/evening pills.  You may need help loading it  6.    Keep a basket, or pegboard by the door.  Place items that you need to take out with you in the basket or on the pegboard.  You may also want to  include a message board for reminders.  7.    Use sticky notes.  Place sticky notes  with reminders in a place where the task is performed.  For example: " turn off the  stove" placed by the stove, "lock the door" placed on the door at eye level, " take your medications" on  the bathroom mirror or by the place where you normally take your medications.  8.    Use alarms/timers.  Use while cooking to remind yourself to check on food or as a reminder to take your medicine, or as a  reminder to make a call, or as a reminder to perform another task, etc. Mild Neurocognitive Disorder Mild neurocognitive disorder, formerly known as mild cognitive impairment, is a disorder in which memory does not work as well as it should. This disorder may also cause problems with other mental functions, including thought, communication, behavior, and completion of tasks. These problems can be noticed and measured, but they usually do not interfere with daily activities or theability to live independently. Mild neurocognitive disorder typically develops after 64 years of age, but it can also develop at younger ages. It is not as serious as major neurocognitive disorder, also known as dementia, but it may be the first sign of it. Generally, symptoms of this condition get worse over time. In rare cases,symptoms can get better. What are the causes? This condition may be caused by: Brain disorders like Alzheimer's disease, Parkinson's disease, and other conditions that gradually damage nerve cells (neurodegenerative conditions). Diseases that affect blood  vessels in the brain and result in small strokes. Certain infections, such as HIV. Traumatic brain injury. Other medical conditions, such as brain tumors, underactive thyroid (hypothyroidism), and vitamin B12 deficiency. Use of certain drugs or prescription medicines. What increases the risk? The following factors may make you more likely to develop this condition: Being older than 65 years. Being male. Low education level. Diabetes, high blood pressure,  high cholesterol, and other conditions that increase the risk for blood vessel diseases. Untreated or undertreated sleep apnea. Having a certain type of gene that can be passed from parent to child (inherited). Chronic health problems such as heart disease, lung disease, liver disease, kidney disease, or depression. What are the signs or symptoms? Symptoms of this condition include: Difficulty remembering. You may: Forget names, phone numbers, or details of recent events. Forget social events and appointments. Repeatedly forget where you put your car keys or other items. Difficulty thinking and solving problems. You may have trouble with complex tasks, such as: Paying bills. Driving in unfamiliar places. Difficulty communicating. You may have trouble: Finding the right word or naming an object. Forming a sentence that makes sense, or understanding what you read or hear. Changes in your behavior or personality. When this happens, you may: Lose interest in the things that you used to enjoy. Withdraw from social situations. Get angry more easily than usual. Act before thinking. How is this diagnosed? This condition is diagnosed based on: Your symptoms. Your health care provider may ask you and the people you spend time with, such as family and friends, about your symptoms. Evaluation of mental functions (neuropsychological testing). Your health care provider may refer you to a neurologist or mental health specialist to evaluate your mental functions in detail. To identify the cause of your condition, your health care provider may: Get a detailed medical history. Ask about use of alcohol, drugs, and prescription medicines. Do a physical exam. Order blood tests and brain imaging exams. How is this treated? Mild neurocognitive disorder that is caused by medicine use, drug use, infection, or another medical condition may improve when the cause is treated, or when medicines or drugs are  stopped. If this disorder has another cause, it generally does not improve and may get worse. In these cases, the goal of treatment is to help you manage the loss of mental function. Treatments in these cases include: Medicine. Medicine mainly helps memory and behavior symptoms. Talk therapy. Talk therapy provides education, emotional support, memory aids, and other ways of making up for problems with mental function. Lifestyle changes, including: Getting regular exercise. Eating a healthy diet that includes omega-3 fatty acids. Challenging your thinking and memory skills. Having more social interaction. Follow these instructions at home: Eating and drinking  Drink enough fluid to keep your urine pale yellow. Eat a healthy diet that includes omega-3 fatty acids. These can be found in: Fish. Nuts. Leafy vegetables. Vegetable oils. If you drink alcohol: Limit how much you use to: 0-1 drink a day for women. 0-2 drinks a day for men. Be aware of how much alcohol is in your drink. In the [place] one drink equals one 12 oz bottle of beer (355 mL), one 5 oz glass of wine (148 mL), or one 1 oz glass of hard liquor (44 mL).  Lifestyle  Get regular exercise as told by your health care provider. Do not use any products that contain nicotine or tobacco, such as cigarettes, e-cigarettes, and chewing tobacco. If you need help quitting, ask  your health care provider. Practice ways to manage stress. If you need help managing stress, ask your health care provider. Continue to have social interaction. Keep your mind active with stimulating activities you enjoy, such as reading or playing games. Make sure to get quality sleep. Follow these tips: Avoid napping during the day. Keep your sleeping area dark and cool. Avoid exercising during the few hours before you go to bed. Avoid caffeine products in the evening.  General instructions Take over-the-counter and prescription medicines only as told by  your health care provider. Your health care provider may recommend that you avoid taking medicines that can affect thinking, such as pain medicines or sleep medicines. Work with your health care provider to find out what you need help with and what your safety needs are. Keep all follow-up visits. This is important. Where to find more information [employer] on Aging: https://walker.com/ Contact a health care provider if: You have any new symptoms. Get help right away if: You develop new confusion or your confusion gets worse. You act in ways that place you or your family in danger. Summary Mild neurocognitive disorder is a disorder in which memory does not work as well as it should. Mild neurocognitive disorder can have many causes. It may be the first stage of dementia. To manage your condition, get regular exercise, keep your mind active, get quality sleep, and eat a healthy diet. This information is not intended to replace advice given to you by your health care provider. Make sure you discuss any questions you have with your healthcare provider. Document Revised: 09/12/2019 Document Reviewed: 09/12/2019 Elsevier Patient Education  2022 [employer].

## 2020-10-24 ENCOUNTER — Telehealth: Payer: Self-pay | Admitting: Neurology

## 2020-10-24 LAB — DEMENTIA PANEL
Homocysteine: 7.1 umol/L (ref 0.0–17.2)
RPR Ser Ql: NONREACTIVE
TSH: 1.5 u[IU]/mL (ref 0.450–4.500)
Vitamin B-12: 791 pg/mL (ref 232–1245)

## 2020-10-24 NOTE — Telephone Encounter (Signed)
Spoke with Early @ [facility] [phone]) to check CPT [phone]. No PA is required. Reference [phone]. We will call patient to schedule MRI at [facility].

## 2020-10-24 NOTE — Telephone Encounter (Signed)
Unable to schedule patient at [facility]- he has a loop recorder. I faxed patient's MRI order to [facility] @ [phone].

## 2020-10-24 NOTE — Progress Notes (Signed)
Carelink Summary Report / Loop Recorder 

## 2020-11-05 ENCOUNTER — Telehealth: Payer: Self-pay | Admitting: *Deleted

## 2020-11-05 ENCOUNTER — Ambulatory Visit (INDEPENDENT_AMBULATORY_CARE_PROVIDER_SITE_OTHER): Payer: 59

## 2020-11-05 DIAGNOSIS — I639 Cerebral infarction, unspecified: Secondary | ICD-10-CM | POA: Diagnosis not present

## 2020-11-05 LAB — CUP PACEART REMOTE DEVICE CHECK
Date Time Interrogation Session: 20220626070425
Implantable Pulse Generator Implant Date: 20210928

## 2020-11-05 NOTE — Telephone Encounter (Signed)
-----   Message from Micki Riley, MD sent at 11/05/2020  5:08 PM EDT ----- Kindly inform the patient that lab work for reversible causes of memory loss was all normal

## 2020-11-05 NOTE — Telephone Encounter (Signed)
I spoke to the patient and provided him with the lab results.  

## 2020-11-05 NOTE — Progress Notes (Signed)
Kindly inform the patient that lab work for reversible causes of memory loss was all normal

## 2020-11-08 ENCOUNTER — Ambulatory Visit (HOSPITAL_COMMUNITY)
Admission: RE | Admit: 2020-11-08 | Discharge: 2020-11-08 | Disposition: A | Discharge status: Home / Self Care | Payer: 59 | Source: Ambulatory Visit | Attending: Neurology | Admitting: Neurology

## 2020-11-08 ENCOUNTER — Other Ambulatory Visit: Payer: Self-pay

## 2020-11-08 DIAGNOSIS — R413 Other amnesia: Secondary | ICD-10-CM | POA: Diagnosis not present

## 2020-11-08 IMAGING — MR MR HEAD WO/W CM
7 of 14 series · 23 of 48 positions shown · IV contrast (gadavist)
Comparison: MRI head [DATE]

CLINICAL DATA: Memory loss

EXAM:
MRI HEAD WITHOUT AND WITH CONTRAST
TECHNIQUE: Multiplanar, multiecho pulse sequences of the brain and surrounding
structures were obtained without and with intravenous contrast.
CONTRAST:  9.5mL GADAVIST GADOBUTROL 1 MMOL/ML IV SOLN

[Series 2: DWI · axial · 3.0mm · 0.94mm/px · z∈[-67,+101]mm · 7 of 113 slices shown (1 of 2)]
[im 1/113]
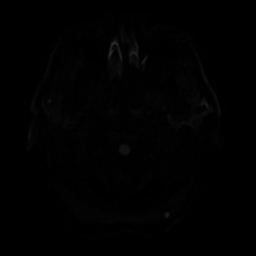
[im 19/113]
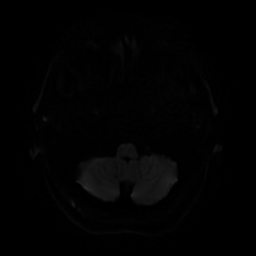
[im 38/113]
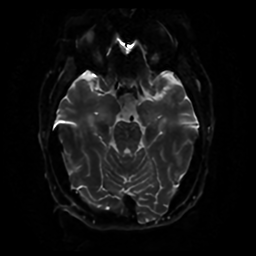
[im 57/113]
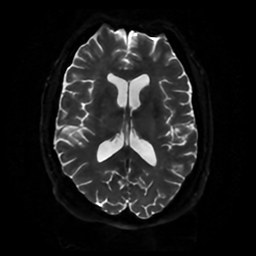
[im 75/113]
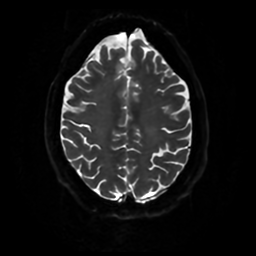
[im 94/113]
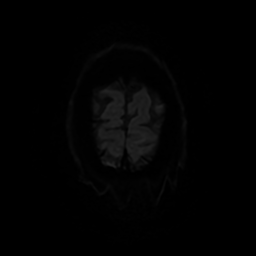
[im 113/113]
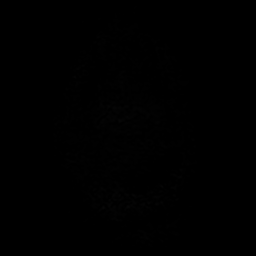

[Series 3: DWI · coronal · 4.0mm · 0.94mm/px · 5 of 80 slices shown (2 of 2)]
[im 1/80]
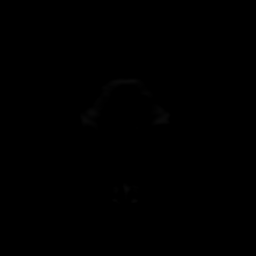
[im 20/80]
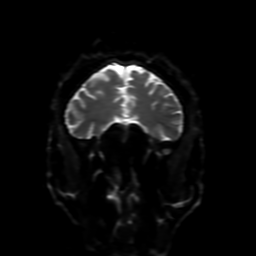
[im 40/80]
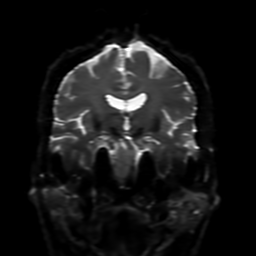
[im 60/80]
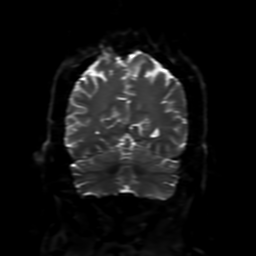
[im 80/80]
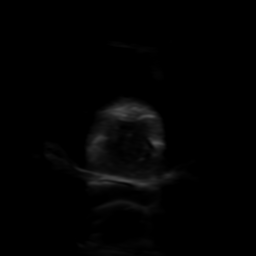

[Series 4: FLAIR · sagittal · 5.0mm · 0.23mm/px · 2 of 28 slices shown (1 of 2)]
[im 1/28]
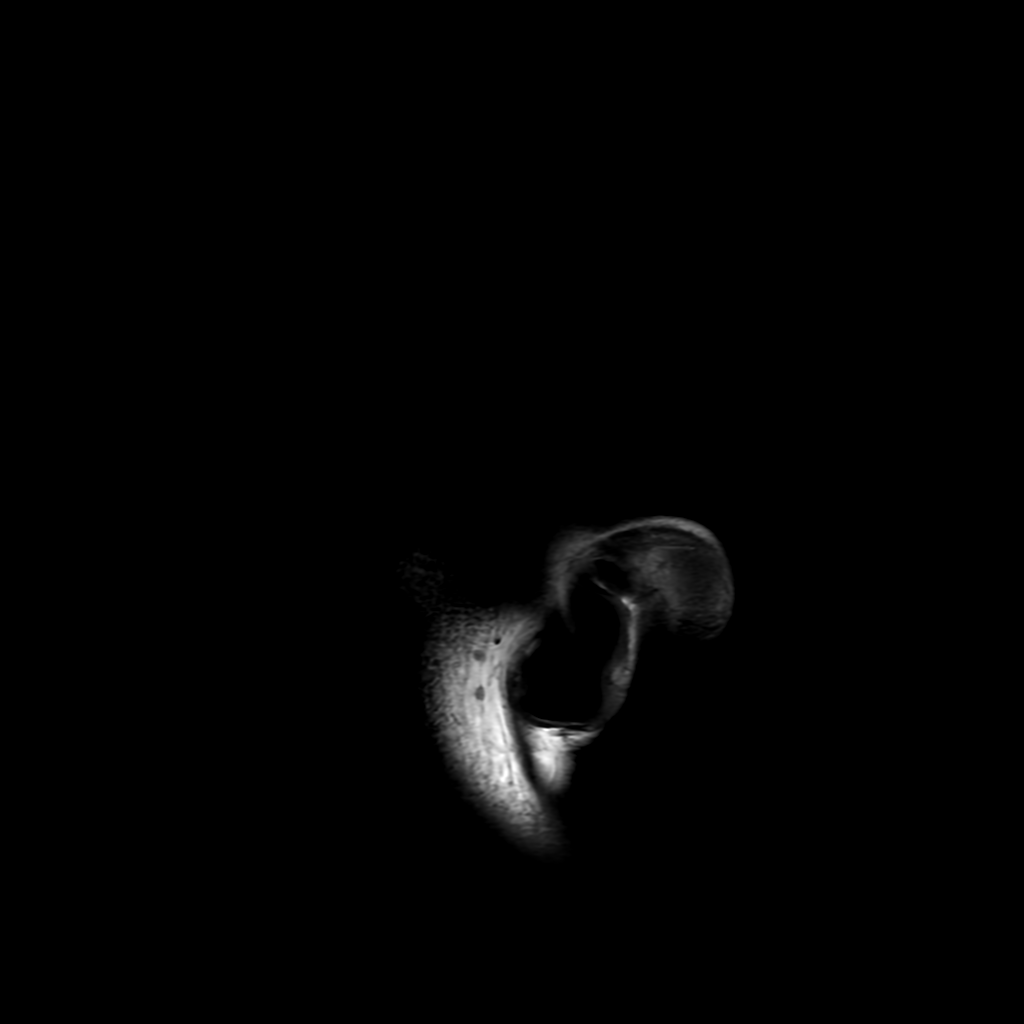
[im 28/28]
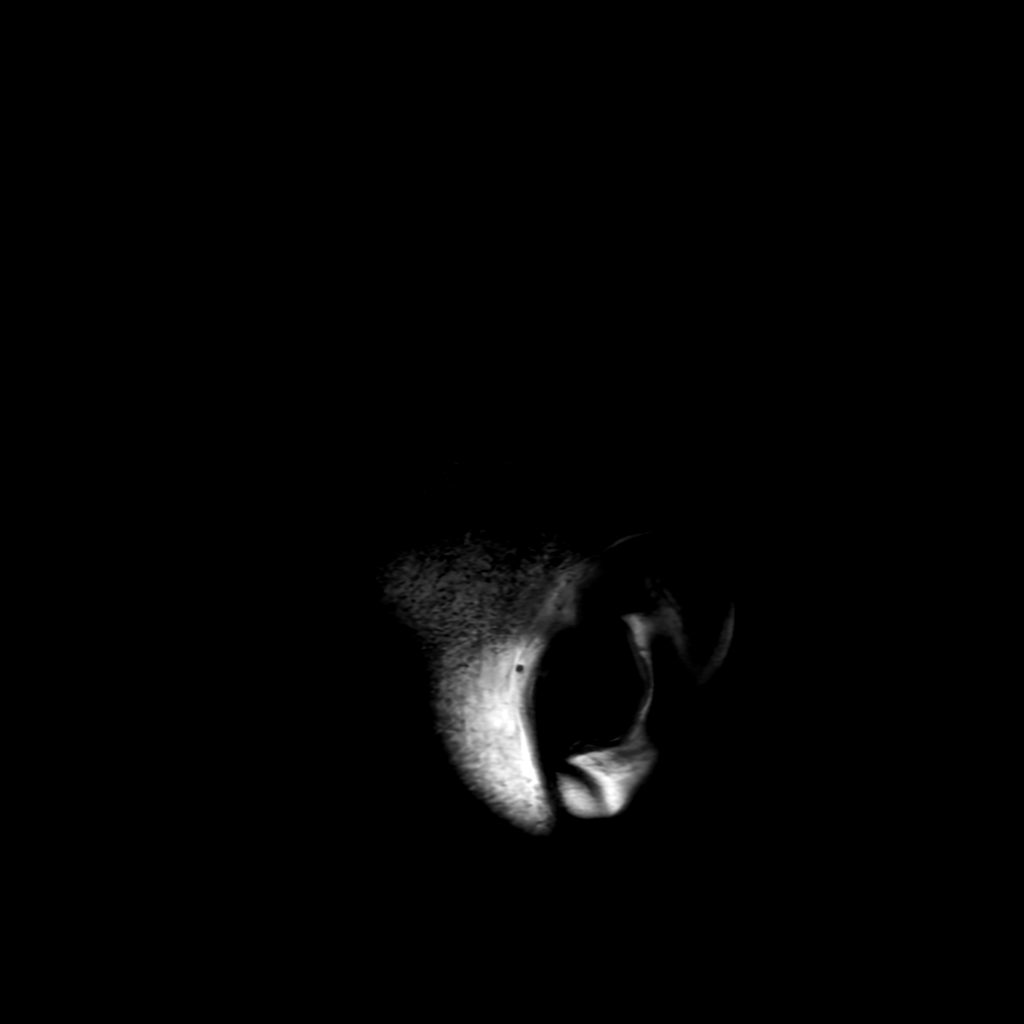

[Series 6: FLAIR · axial · 4.0mm · 0.45mm/px · z∈[-66,+100]mm · 2 of 39 slices shown (2 of 2)]
[im 1/39]
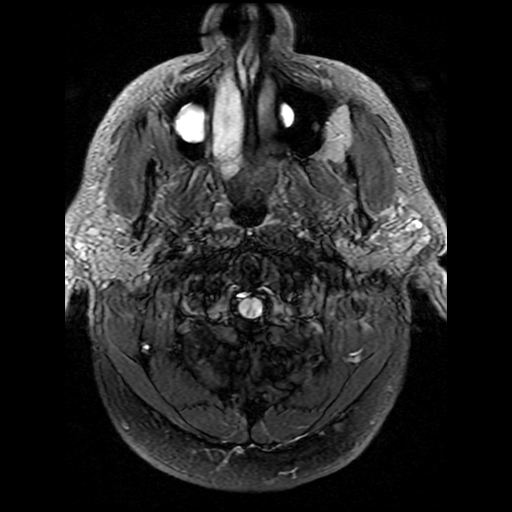
[im 39/39]
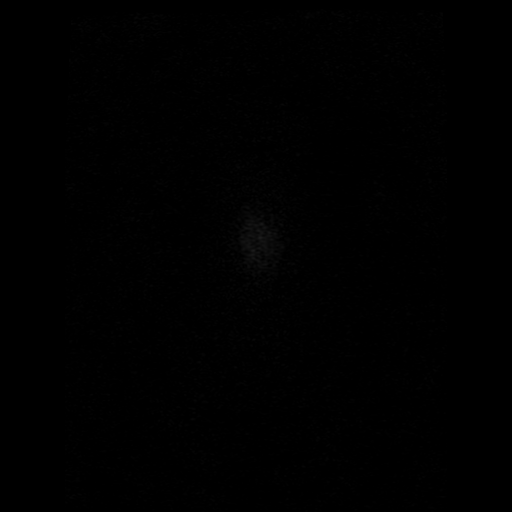

[Series 12: FLAIR post-contrast · sagittal · 5.0mm · 0.47mm/px · 2 of 28 slices shown]
[im 1/28]
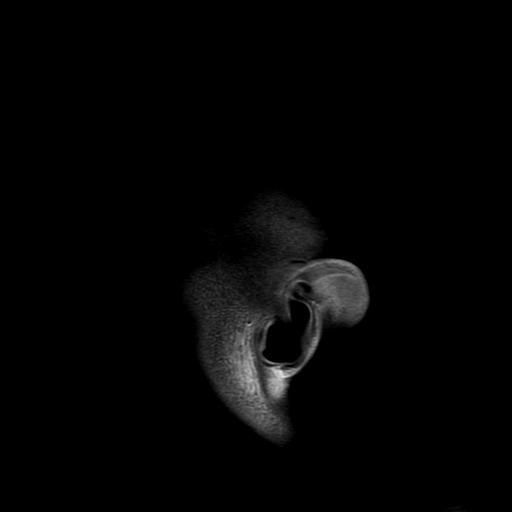
[im 28/28]
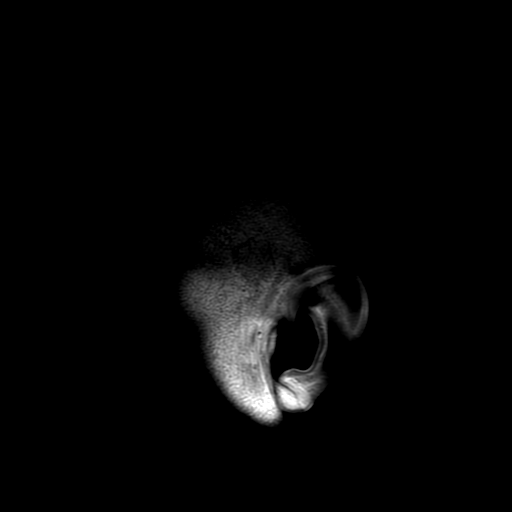

[Series 250: ADC · axial · 3.0mm · 0.94mm/px · z∈[-67,+101]mm · 3 of 56 slices shown (1 of 2)]
[im 1/56]
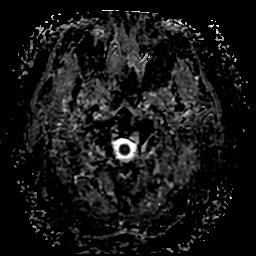
[im 28/56]
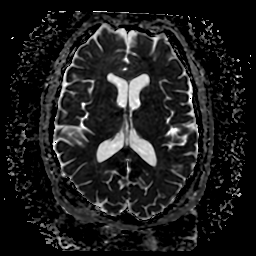
[im 56/56]
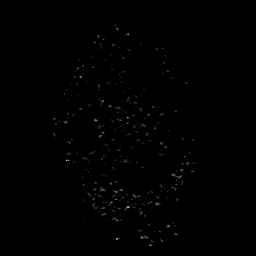

[Series 350: ADC · coronal · 4.0mm · 0.94mm/px · 2 of 39 slices shown (2 of 2)]
[im 1/39]
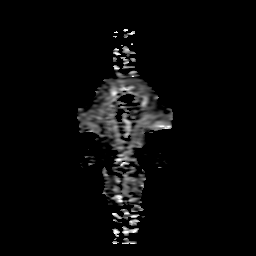
[im 39/39]
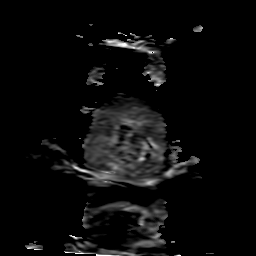

[23 of 48 positions shown; findings below may reference images not displayed]

FINDINGS: Brain: Ventricle size and cerebral volume normal. Chronic infarct
with volume loss in the tail hippocampus on the left. This showed
restricted diffusion on the prior MRI. No acute infarct. Minimal
white matter changes. Negative for hemorrhage, mass, or fluid
collection. Normal enhancement.

Vascular: Normal arterial flow voids.

Skull and upper cervical spine: Negative

Sinuses/Orbits: Mild mucosal edema paranasal sinuses. Negative orbit

Other: None
IMPRESSION: No acute abnormality. Chronic infarct tail of the hippocampus on the
left.

## 2020-11-08 MED ORDER — GADOBUTROL 1 MMOL/ML IV SOLN
9.5000 mL | Freq: Once | INTRAVENOUS | Status: AC | PRN
  Administered 2020-11-08: 9.5 mL via INTRAVENOUS

## 2020-11-09 NOTE — Progress Notes (Signed)
Kindly inform the patient that MRI scan of the brain shows stable appearance of the the small stroke on the left side in the deep portion of the brain.  No new or worrisome finding.

## 2020-11-13 ENCOUNTER — Telehealth: Payer: Self-pay | Admitting: *Deleted

## 2020-11-13 NOTE — Telephone Encounter (Signed)
I spoke to the patent and notified him of the MRI brain results.

## 2020-11-13 NOTE — Telephone Encounter (Signed)
-----   Message from Micki Riley, MD sent at 11/09/2020  4:04 PM EDT ----- Joneen Roach inform the patient that MRI scan of the brain shows stable appearance of the the small stroke on the left side in the deep portion of the brain.  No new or worrisome finding.

## 2020-11-15 ENCOUNTER — Other Ambulatory Visit: Payer: Self-pay

## 2020-11-15 ENCOUNTER — Ambulatory Visit (INDEPENDENT_AMBULATORY_CARE_PROVIDER_SITE_OTHER): Payer: 59 | Admitting: Psychology

## 2020-11-15 ENCOUNTER — Ambulatory Visit: Payer: 59 | Admitting: Psychology

## 2020-11-15 ENCOUNTER — Encounter: Payer: Self-pay | Admitting: Psychology

## 2020-11-15 DIAGNOSIS — F01A0 Vascular dementia, mild, without behavioral disturbance, psychotic disturbance, mood disturbance, and anxiety: Secondary | ICD-10-CM | POA: Insufficient documentation

## 2020-11-15 DIAGNOSIS — F0150 Vascular dementia without behavioral disturbance: Secondary | ICD-10-CM | POA: Diagnosis not present

## 2020-11-15 DIAGNOSIS — R4189 Other symptoms and signs involving cognitive functions and awareness: Secondary | ICD-10-CM

## 2020-11-15 DIAGNOSIS — I639 Cerebral infarction, unspecified: Secondary | ICD-10-CM

## 2020-11-15 DIAGNOSIS — F411 Generalized anxiety disorder: Secondary | ICD-10-CM | POA: Insufficient documentation

## 2020-11-15 DIAGNOSIS — I999 Unspecified disorder of circulatory system: Secondary | ICD-10-CM

## 2020-11-15 HISTORY — DX: Unspecified disorder of circulatory system: I99.9

## 2020-11-15 HISTORY — DX: Vascular dementia without behavioral disturbance: F01.50

## 2020-11-15 HISTORY — DX: Vascular dementia, mild, without behavioral disturbance, psychotic disturbance, mood disturbance, and anxiety: F01.A0

## 2020-11-15 NOTE — Progress Notes (Signed)
   Psychometrician Note   Cognitive testing was administered to Jeffery Willis by Wallace Keller, B.S. (psychometrist) under the supervision of Dr. Newman Nickels, Ph.D., licensed psychologist on 11/15/20. Jeffery Willis did not appear overtly distressed by the testing session per behavioral observation or responses across self-report questionnaires. Rest breaks were offered.    The battery of tests administered was selected by Dr. Newman Nickels, Ph.D. with consideration to Jeffery Willis current level of functioning, the nature of his symptoms, emotional and behavioral responses during interview, level of literacy, observed level of motivation/effort, and the nature of the referral question. This battery was communicated to the psychometrist. Communication between Dr. Newman Nickels, Ph.D. and the psychometrist was ongoing throughout the evaluation and Dr. Newman Nickels, Ph.D. was immediately accessible at all times. Dr. Newman Nickels, Ph.D. provided supervision to the psychometrist on the date of this service to the extent necessary to assure the quality of all services provided.    Jeffery Willis will return within approximately 1-2 weeks for an interactive feedback session with Dr. Milbert Coulter at which time his test performances, clinical impressions, and treatment recommendations will be reviewed in detail. Jeffery Willis understands he can contact our office should he require our assistance before this time.  A total of 155 minutes of billable time were spent face-to-face with Jeffery Willis by the psychometrist. This includes both test administration and scoring time. Billing for these services is reflected in the clinical report generated by Dr. Newman Nickels, Ph.D.  This note reflects time spent with the psychometrician and does not include test scores or any clinical interpretations made by Dr. Milbert Coulter. The full report will follow in a separate note.

## 2020-11-15 NOTE — Progress Notes (Signed)
NEUROPSYCHOLOGICAL EVALUATION [place]. [facility] [place] Department of Neurology  Date of Evaluation: November 15, 2020  Reason for Referral:   Jeffery Willis is a 65 y.o. right-handed Caucasian male referred by  Crawford Givens, M.D. , to characterize his current cognitive functioning and assist with diagnostic clarity and treatment planning in the context of reported memory loss stemming from a prior left PCA stroke with hippocampal involvement in September 2021.   Assessment and Plan:   Clinical Impression(s): Jeffery Willis's pattern of performance is suggestive of variability but overall below expectation performances across all aspects of verbal learning and memory. An additional weakness was exhibited across complex attention (i.e., working memory). Performance was appropriate across all other assessed cognitive domains. This includes processing speed, basic attention, executive functioning, receptive and expressive language, visuospatial abilities, and visual learning and memory. Jeffery Willis denied difficulties completing instrumental activities of daily living (ADLs) independently. As such, given evidence for cognitive dysfunction described above, he meets criteria for a Mild Neurocognitive Disorder ("mild cognitive impairment") at the present time.  Cerebral infarcts affecting the posterior cerebral artery (PCA) commonly result in visual field defects and motor/sensory deficits. However, in cases where there is involvement of medial temporal lobe structures, memory is commonly implicated. Assuming normal lateralization of any right-handed individual, a left PCA infarct would raise concerns for objective dysfunction across tasks assessing verbal memory. Overall, exhibited verbal memory dysfunction across cognitive testing is quite consistent with expected performances given Jeffery Willis's left PCA stroke with involvement of the tail of the left hippocampus. Deficits in complex  attention/concentration are also quite common in individuals with a positive stroke history, as well as other cerebrovascular illness. 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 Testing did not reveal concerning patterns for an underlying neurodegenerative process at the present time. Continue medical monitoring will be important moving forward.  Recommendations: Typically, an individual will see more significant recovery in the weeks to months immediately after the stroke event. While recovery continues beyond this time, it often slows before eventually come to a plateau. A commonly used rule of thumb can be 12 months to plateau; however, there is certainly variability in this time frame. Jeffery Willis is encouraged to continue adhering to rehabilitation efforts prescribed by his neurologist and PCP. If not already done, he could consider outpatient speech therapy or cognitive rehabilitation to try and improve verbal memory deficiencies and learn other compensatory strategies.   If interested, there are some activities which have therapeutic value and can be useful in keeping him cognitively stimulated. For suggestions, Jeffery Willis is encouraged to go to the following website: https://www.[facility].org/get-to-know-barrow/centers-programs/neurorehabilitation-center/neuro-rehab-apps-and-games/ which has options, categorized by level of difficulty. It should be noted that these activities should not be viewed as a substitute for therapy.  Vascular neurocognitive disorders are generally stable over time assuming that there are no additional stroke events and other cardiovascular conditions which may be present are managed appropriately. Should he or his wife report concerns of cognitive decline as he ages, a repeat neuropsychological evaluation would be warranted at that time. This would aid in future efforts  towards improved diagnostic clarity.  Jeffery Willis is encouraged to attend to lifestyle factors for brain health (e.g., regular physical exercise, good nutrition habits, regular participation in cognitively-stimulating activities, and general stress management techniques), which are likely to have benefits for both emotional adjustment and cognition. Optimal control of vascular risk factors (including  safe cardiovascular exercise and adherence to dietary recommendations) is encouraged. Eating a heart healthy diet (or he could consider the MIND diet) would also be beneficial. Continued participation in activities which provide mental stimulation and social interaction is also recommended.   When learning new information, he would benefit from information being broken up into small, manageable pieces. He may also find it helpful to articulate the material in his own words and in a context to promote encoding at the onset of a new task. This material may need to be repeated multiple times to promote encoding.  Memory can be improved using internal strategies such as rehearsal, repetition, chunking, mnemonics, association, and imagery. External strategies such as written notes in a consistently used memory journal, visual and nonverbal auditory cues such as a calendar on the refrigerator or appointments with alarm, such as on a cell phone, can also help maximize recall.    It is common for individuals with a stroke history to correctly perceive the need to expend a greater degree of effort to obtain results that they were able to previously attain with less effort. Thus, Jeffery Willis may now find himself allocating additional time to complete tasks, and this may contribute to him becoming more fatigued over the course of a day when he has been expending significant mental effort. Additionally, cognitive deficits and other complaints are likely to appear more pronounced when he is overly fatigued, busy, and/or  stressed. He should therefore save mentally demanding tasks for a time of day when he feels most capable.  To address problems with fluctuating attention and working memory, he may wish to consider:   -Avoiding external distractions when needing to concentrate   -Limiting exposure to fast paced environments with multiple sensory demands   -Writing down complicated information and using checklists   -Attempting and completing one task at a time (i.e., no multi-tasking)   -Verbalizing aloud each step of a task to maintain focus   -Taking frequent breaks during the completion of steps/tasks to avoid fatigue   -Reducing the amount of information considered at one time  Review of Records:   Jeffery Willis presented to the ED on 02/05/2020 with acute onset of blurred vision. [facility] and reported feeling dizzy. He was provided some glucose and did feel better. However, on the evening of 02/04/2020 he developed acutely abnormal vision (i.e., blurry with different planes). He also reported some ataxia, left temporoparietal pain, eye pain, bilateral arm tingling, and his left leg feeling "different." He was admitted. Brain MRI revealed an acute left hippocampal tail infarct. He was started on Plavix and atorvastatin. He was monitored with no evidence of arrhythmia. TEE did not show any intracardiac thrombus or other abnormality. A loop recorder was placed. He was discharged on 02/07/2020 with instructions to follow-up with his PCP, cardiology, and neurology as an outpatient.   He was most recently seen by his PCP Crawford Givens(Graham Duncan, M.D.) on 08/02/2020. At that time, Jeffery Willis reported noticing some changes in his reasoning. Specifically, he noted being able to get to the correct answer, but it taking him far longer to do so than normal. He reported occasional headaches and some fatigue. While at work, he described slowed recall of information (e.g., recalling the brand and generic names for  certain medications). Performance on a brief cognitive screening instrument (MMSE) was 30/30. Ultimately, Jeffery Willis was referred for a comprehensive neuropsychological evaluation to characterize his cognitive abilities and to assist with  diagnostic clarity and treatment planning.   He was most recently seen by [facility] Delia Heady, M.D.) on 10/23/2020. Dr. Pearlean Brownie noted residual right superior quadrantic vision loss since Jeffery Willis's stroke. Similar cognitive concerns as mentioned above were discussed. Some mild improvements in cognitive abilities were reported. Atrial fibrillation had not been detected at that time. 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 Performance on a different brief cognitive screening instrument (MOCA) was 28/30.   Follow-up brain MRI on 11/09/2020 revealed a chronic and stable infarct involving the tail of the left hippocampus. No acute abnormalities were observed.   Past Medical History:  Diagnosis Date   Achilles tendonitis 10/08/2017   Calculus of kidney 02/14/2010   Cerebrovascular accident 02/04/2020   L PCA stroke with hippocampal involvement   Generalized anxiety disorder    History of cardiovascular stress test 06/2005   normal myocardial perfusion. Given these results and the patient's persistence of flipped T waves, I think these are likely asymmptomatic and clinically insignificant findings   Skin lesion     Past Surgical History:  Procedure Laterality Date   2 D Echo  06/2005   normal LV function and no LVH.  Normal 2D echowith ER60 to 70%   BUBBLE STUDY  02/07/2020   Procedure: BUBBLE STUDY;  Surgeon: [name], MD;  Location: [facility] ENDOSCOPY;  Service: Cardiovascular;;   KIDNEY STONE SURGERY     LOOP RECORDER INSERTION N/A 02/07/2020   Procedure: LOOP RECORDER INSERTION;  Surgeon: Hillis Range, MD;  Location: [facility] INVASIVE CV LAB;  Service:  Cardiovascular;  Laterality: N/A;   TEE WITHOUT CARDIOVERSION N/A 02/07/2020   Procedure: TRANSESOPHAGEAL ECHOCARDIOGRAM (TEE);  Surgeon: [name], MD;  Location: [facility] ENDOSCOPY;  Service: Cardiovascular;  Laterality: N/A;    Current Outpatient Medications:    atorvastatin (LIPITOR) 40 MG tablet, Take 1 tablet (40 mg total) by mouth daily., Disp: 90 tablet, Rfl: 3   clopidogrel (PLAVIX) 75 MG tablet, Take 75 mg by mouth daily., Disp: , Rfl:    escitalopram (LEXAPRO) 10 MG tablet, Take 1 tablet (10 mg total) by mouth daily., Disp: , Rfl:    metoprolol succinate (TOPROL XL) 25 MG 24 hr tablet, Take 1 tablet (25 mg total) by mouth daily., Disp: 90 tablet, Rfl: 3  Clinical Interview:   The following information was obtained during a clinical interview with Jeffery Willis and his wife prior to cognitive testing.  Cognitive Symptoms: Decreased short-term memory: Endorsed. Jeffery Willis acknowledged some trouble recalling the details of conversations or names of certain medications. He noted that information will generally come to him, it just is far more effortful. He also noted a delay in remembering how to get to certain locations while driving. His wife noted that trouble remembering the names of certain individuals is longstanding and unchanged in nature.  Decreased long-term memory: Denied. Decreased attention/concentration: Denied. Specifically, Jeffery Willis stated that difficulties were "no more than usual." His wife added that him being somewhat absent-minded is longstanding and unchanged in nature.  Reduced processing speed: Endorsed. 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 He did state feeling that these difficulties had mildly improved since his stroke.  Difficulties with executive functions: Generally denied. His wife pointed out some trouble making decisions. Jeffery Willis  stated that it was more disdain towards  making decisions in general rather than worsening indecision. He denied trouble with organization, impulsivity, or disinhibition. His wife alluded to some very mild personality changes where Jeffery Willis may appear more "child-like." While she stated that their children had observed this as well, she was unable to provide any specific examples or other details.  Difficulties with emotion regulation: Denied. Difficulties with receptive language: Denied. Difficulties with word finding: Denied. Decreased visuoperceptual ability: Denied.  Trajectory of deficits: Per Jeffery Willis, all observed cognitive deficits have been present since his September 2021 stoke. His wife alluded to the potential for very mild memory difficulties prior to this. However, she seemed overall unsure.   Difficulties completing ADLs: Largely denied. He manages personal medications independently and denied trouble driving. His wife took over financial management in the immediate aftermath of his stroke. However, she stated that she does not currently have concerns regarding financial management at the present time.   Additional Medical History: History of traumatic brain injury/concussion: Denied. History of seizure activity: Denied. History of known exposure to toxins: Denied. Symptoms of chronic pain: Denied. Experience of frequent headaches/migraines: Denied. He did report occasional headache symptoms.  Frequent instances of dizziness/vertigo: Endorsed. Symptoms were generally said to occur after standing quickly from a previously seated or supine position, as well as when he goes periods of time without eating.   Sensory changes: Consistent with medical records, he acknowledged a history of right superior quadrantic vision loss since his stroke. He does wear glasses with positive effect but stated he does not notice the effects of vision loss in day-to-day actions. Other sensory  changes/difficulties (e.g., hearing, taste, or smell) were denied.  Balance/coordination difficulties: Denied. He also denied any recent falls.  Other motor difficulties: Denied.  Sleep History: Estimated hours obtained each night: 7-8 hours.  Difficulties falling asleep: Denied. Difficulties staying asleep: Denied. Feels rested and refreshed upon awakening: Endorsed "usually." His wife added that he will occasionally nap during the day.   History of snoring: Endorsed while laying on his back.  History of waking up gasping for air: Denied. Witnessed breath cessation while asleep: Denied.  History of vivid dreaming: Denied. Excessive movement while asleep: Denied. Instances of acting out his dreams: Denied.  Psychiatric/Behavioral Health History: Depression: He described his current mood as "pretty good" and denied to his knowledge any prior mental health concerns or diagnoses surrounding depression. He acknowledged a prior period of time where he was experiencing numerous significant life stressors which did result in him being prescribed Lexapro. He is currently taking this medication but expressed a desire to taper off. Current or remote suicidal ideation, intent, or plan was denied.  Anxiety: Denied. However, medical records do report a prior history of generalized anxiety symptoms.  Mania: Denied. Trauma History: Denied. Visual/auditory hallucinations: Denied. Delusional thoughts: Denied.  Tobacco: Denied. Alcohol: He reported very rare alcohol consumption and denied a history of problematic alcohol abuse or dependence.  Recreational drugs: Denied. Caffeine: He reported consuming a relatively large amount of Diet Coke on a daily basis.   Family History: Problem Relation Age of Onset   Heart disease Mother        arrhythmia   Stroke Mother    Hypertension Father        mild   Arthritis Father    Memory loss Maternal Grandmother        possible undiagnosed Alzheimer's  disease   Prostate cancer Paternal Grandfather    Colon cancer Neg Hx    This information was  confirmed by Mr. Belair.  Academic/Vocational History: Highest level of educational attainment: 16 years. He graduated from high school and earned a Oncologist. He described himself as an average (B/C) student in academic settings. Physics was noted as a likely relative weakness.  History of developmental delay: Denied. History of grade repetition: Denied. Enrollment in special education courses: Denied. History of LD/ADHD: Denied.  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 While he reported observing diminished processing speed and word recall impacting personal performance, he denied any colleague or supervisor expressing concerns surrounding work performance or increased mistakes.   Evaluation Results:   Behavioral Observations: Mr. Sterling was accompanied by his wife, arrived to his appointment on time, and was appropriately dressed and groomed. He appeared alert and oriented. Observed gait and station were within normal limits. Gross motor functioning appeared intact upon informal observation and no abnormal movements (e.g., tremors) were noted. His affect was generally relaxed and positive. Spontaneous speech was fluent and word finding difficulties were not observed during the clinical interview. Thought processes were coherent, organized, and normal in content. Insight into his cognitive difficulties appeared adequate. During testing, sustained attention was appropriate. Task engagement was adequate and he persisted when challenged. Overall, Mr. Carneiro was cooperative with the clinical interview and subsequent testing procedures.   Adequacy of Effort: The validity of neuropsychological testing is limited by the extent to which the individual being tested may be assumed to have exerted  adequate effort during testing. Mr. Braley expressed his intention to perform to the best of his abilities and exhibited adequate task engagement and persistence. Scores across stand-alone and embedded performance validity measures were within expectation. As such, the results of the current evaluation are believed to be a valid representation of Mr. Messer current cognitive functioning.  Test Results: Mr. Mano was largely oriented at the time of the current evaluation. Points were lost for him being one day off when stating the current date.  Intellectual abilities based upon educational and vocational attainment were estimated to be in the average range. Premorbid abilities were estimated to be within the average range based upon a single-word reading test.   Processing speed was average to above average. Basic attention was average. More complex attention (e.g., working memory) was well below average. Executive functioning was below average to average.  Assessed receptive language abilities were average. Likewise, Mr. Chaikin did not exhibit any difficulties comprehending task instructions and answered all questions asked of him appropriately. Assessed expressive language (e.g., verbal fluency and confrontation naming) was average to exceptionally high.     Assessed visuospatial/visuoconstructional abilities were average to well above average.    Learning (i.e., encoding) of novel verbal and visual information was variable. Leaning of verbal information was exceptionally low to below average, while learning of visual information was average. Spontaneous delayed recall (i.e., retrieval) of previously learned information was exceptionally low to average across verbal measures and average across a visual task. Retention rates were 72% across a story learning task, 78% across a list learning task, and 100% across a shape learning task. Performance across recognition tasks was well below average  across verbal tasks and average across a visual task, suggesting  varying evidence for information consolidation.   Results of emotional screening instruments suggested that recent symptoms of generalized anxiety were in the minimal range, while symptoms of depression were within normal limits. A screening instrument assessing recent sleep quality  suggested the presence of minimal sleep dysfunction.  Tables of Scores:   Note: This summary of test scores accompanies the interpretive report and should not be considered in isolation without reference to the appropriate sections in the text. Descriptors are based on appropriate normative data and may be adjusted based on clinical judgment. Terms such as "Within Normal Limits" and "Outside Normal Limits" are used when a more specific description of the test score cannot be determined.       Percentile - Normative Descriptor > 98 - Exceptionally High 91-97 - Well Above Average 75-90 - Above Average 25-74 - Average 9-24 - Below Average 2-8 - Well Below Average < 2 - Exceptionally Low       Validity:   DESCRIPTOR       ACS Word Choice: --- --- Within Normal Limits  Dot Counting Test: --- --- Within Normal Limits  NAB EVI: --- --- Within Normal Limits  D-KEFS Color Word Effort Index: --- --- Within Normal Limits       Orientation:      Raw Score Percentile   NAB Orientation, Form 1 28/29 --- ---       Cognitive Screening:      Raw Score Percentile   SLUMS: 21/30 --- ---       Intellectual Functioning:      Standard Score Percentile   Barona Formula Estimated Premorbid IQ 107 68 Average        Standard Score Percentile   Test of Premorbid Functioning: 91 27 Average       Memory:     NAB Memory Module, Form 1: Standard Score/ T Score Percentile   Total Memory Index 77 6 Well Below Average  List Learning       Total Trials 1-3 19/36 (40) 16 Below Average    List B 1/12 (24) <1 Exceptionally Low    Short Delay Free Recall 9/12 (56)  73 Average    Long Delay Free Recall 7/12 (47) 38 Average    Retention Percentage 78 (42) 21 Below Average    Recognition Discriminability 3 (35) 7 Well Below Average  Shape Learning       Total Trials 1-3 15/27 (46) 34 Average    Delayed Recall 6/9 (50) 50 Average    Retention Percentage 100 (51) 54 Average    Recognition Discriminability 7 (52) 58 Average  Story Learning       Immediate Recall 28/80 (25) 1 Exceptionally Low    Delayed Recall 13/40 (30) 2 Well Below Average    Retention Percentage 72 (36) 8 Well Below Average  Daily Living Memory       Immediate Recall 38/51 (40) 16 Below Average    Delayed Recall 5/17 (19) <1 Exceptionally Low    Retention Percentage 36 (8) <1 Exceptionally Low    Recognition Hits 7/10 (33) 5 Well Below Average       Attention/Executive Function:     Trail Making Test (TMT): Raw Score (T Score) Percentile     Part A 24 secs.,  0 errors (57) 75 Above Average    Part B 72 secs.,  1 error (50) 50 Average         Scaled Score Percentile   WAIS-IV Coding: 13 84 Above Average       NAB Attention Module, Form 1: T Score Percentile     Digits Forward 43 25 Average    Digits Backwards 32 4 Well Below Average  D-KEFS Color-Word Interference Test: Raw Score (Scaled Score) Percentile     Color Naming 30 secs. (11) 63 Average    Word Reading 21 secs. (12) 75 Above Average    Inhibition 63 secs. (11) 63 Average      Total Errors 0 errors (12) 75 Above Average    Inhibition/Switching 95 secs. (7) 16 Below Average      Total Errors 5 errors (8) 25 Average       D-KEFS Verbal Fluency Test: Raw Score (Scaled Score) Percentile     Letter Total Correct 36 (10) 50 Average    Category Total Correct 51 (16) 98 Exceptionally High    Category Switching Total Correct 12 (9) 37 Average    Category Switching Accuracy 10 (9) 37 Average      Total Set Loss Errors 2 (10) 50 Average      Total Repetition Errors 11 (2) <1 Exceptionally Low       D-KEFS 20  Questions Test: Scaled Score Percentile     Total Weighted Achievement Score 14 91 Well Above Average    Initial Abstraction Score 18 >99 Exceptionally High       [place] Card Sorting Test: Raw Score Percentile     Categories (trials) 2 (64) 11-16 Below Average    Total Errors 19 31 Average    Perseverative Errors 9 27 Average    Non-Perseverative Errors 10 16 Below Average    Failure to Maintain Set 1 --- ---       Language:     Verbal Fluency Test: Raw Score (T Score) Percentile     Phonemic Fluency (FAS) 36 (44) 27 Average    Animal Fluency 28 (66) 95 Well Above Average        NAB Language Module, Form 1: T Score Percentile     Auditory Comprehension 55 69 Average    Naming 31/31 (54) 67 Average       Visuospatial/Visuoconstruction:      Raw Score Percentile   Clock Drawing: 10/10 --- Within Normal Limits       NAB Spatial Module, Form 1: T Score Percentile     Figure Drawing Copy 63 91 Well Above Average    Figure Drawing Immediate Recall 54 66 Average        Scaled Score Percentile   WAIS-IV Block Design: 10 50 Average  WAIS-IV Matrix Reasoning: 14 91 Well Above Average       Mood and Personality:      Raw Score Percentile   Beck Depression Inventory - II: 6 --- Within Normal Limits  PROMIS Anxiety Questionnaire: 9 --- None to Slight       Additional Questionnaires:      Raw Score Percentile   PROMIS Sleep Disturbance Questionnaire: 9 --- None to Slight   Informed Consent and Coding/Compliance:   The current evaluation represents a clinical evaluation for the purposes previously outlined by the referral source and is in no way reflective of a forensic evaluation.   Mr. Degrazia was provided with a verbal description of the nature and purpose of the present neuropsychological evaluation. Also reviewed were the foreseeable risks and/or discomforts and benefits of the procedure, limits of confidentiality, and mandatory reporting requirements of this provider. The  patient was given the opportunity to ask questions and receive answers about the evaluation. Oral consent to participate was provided by the patient.   This evaluation was conducted by Newman Nickels, Ph.D., licensed clinical neuropsychologist. Mr. Maalouf completed a  clinical interview with Dr. Milbert Coulter, billed as one unit [phone], and 155 minutes of cognitive testing and scoring, billed as one unit [phone] and four additional units 96139. Psychometrist Wallace Keller, B.S., assisted Dr. Milbert Coulter with test administration and scoring procedures. As a separate and discrete service, Dr. Milbert Coulter spent a total of 170 minutes in interpretation and report writing billed as one unit [phone] and two units 96133.

## 2020-11-20 ENCOUNTER — Other Ambulatory Visit: Payer: 59

## 2020-11-22 NOTE — Progress Notes (Signed)
Carelink Summary Report / Loop Recorder 

## 2020-12-03 ENCOUNTER — Encounter: Payer: 59 | Admitting: Psychology

## 2020-12-04 ENCOUNTER — Other Ambulatory Visit: Payer: Self-pay

## 2020-12-04 ENCOUNTER — Ambulatory Visit (INDEPENDENT_AMBULATORY_CARE_PROVIDER_SITE_OTHER): Payer: 59 | Admitting: Psychology

## 2020-12-04 DIAGNOSIS — I639 Cerebral infarction, unspecified: Secondary | ICD-10-CM | POA: Diagnosis not present

## 2020-12-04 DIAGNOSIS — F0150 Vascular dementia without behavioral disturbance: Secondary | ICD-10-CM | POA: Diagnosis not present

## 2020-12-04 DIAGNOSIS — F01A0 Vascular dementia, mild, without behavioral disturbance, psychotic disturbance, mood disturbance, and anxiety: Secondary | ICD-10-CM

## 2020-12-04 NOTE — Progress Notes (Signed)
   Neuropsychology Feedback Session Jeffery Willis. [facility]  Department of Neurology  Reason for Referral:   [name] is a 65 y.o. right-handed Caucasian male referred by  Crawford Givens, M.D. , to characterize his current cognitive functioning and assist with diagnostic clarity and treatment planning in the context of reported memory loss stemming from a prior left PCA stroke with hippocampal involvement in September 2021.  Feedback:   Mr. [name] completed a comprehensive neuropsychological evaluation on 11/15/2020. Please refer to that encounter for the full report and recommendations. Briefly, results suggested variability but overall below expectation performances across all aspects of verbal learning and memory. An additional weakness was exhibited across complex attention (i.e., working memory). Cerebral infarcts affecting the posterior cerebral artery (PCA) commonly result in visual field defects and motor/sensory deficits. However, in cases where there is involvement of medial temporal lobe structures, memory is commonly implicated. Assuming normal lateralization of any right-handed individual, a left PCA infarct would raise concerns for objective dysfunction across tasks assessing verbal memory. Overall, exhibited verbal memory dysfunction across cognitive testing is quite consistent with expected performances given Mr. [name]'s left PCA stroke with involvement of the tail of the left hippocampus. Deficits in complex attention/concentration are also quite common in individuals with a positive stroke history, as well as other cerebrovascular illness. Overall, Mr. 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  Mr. [name] was accompanied by his son during the current feedback session. Content of the current session focused on the results of his neuropsychological evaluation. Mr. [name] and  his son were given the opportunity to ask questions and their questions were answered. They were encouraged to reach out should additional questions arise. His report is available to him on MyChart.      25 minutes were spent conducting the current feedback session with Mr. [name], billed as one unit [phone].

## 2020-12-08 LAB — CUP PACEART REMOTE DEVICE CHECK
Date Time Interrogation Session: 20220729070741
Implantable Pulse Generator Implant Date: 20210928

## 2020-12-10 ENCOUNTER — Ambulatory Visit (INDEPENDENT_AMBULATORY_CARE_PROVIDER_SITE_OTHER): Payer: 59

## 2020-12-10 DIAGNOSIS — I639 Cerebral infarction, unspecified: Secondary | ICD-10-CM | POA: Diagnosis not present

## 2020-12-11 ENCOUNTER — Telehealth: Payer: Self-pay | Admitting: Internal Medicine

## 2020-12-11 NOTE — Telephone Encounter (Signed)
Patient c/o Palpitations:  High priority if patient c/o lightheadedness, shortness of breath, or chest pain  How long have you had palpitations/irregular HR/ Afib? Are you having the symptoms now?  Patient's wife states about 1 week ago the patient had an episode when going up the stairs. His HR was rapid. He had another episode on 12/09/20 when hiking. Per wife, patient states his heart felt like it was going to explode. He hasn't recorded his HR. Also, hasn't had another episode since 12/09/20.  Are you currently experiencing lightheadedness, SOB or CP?  No   Do you have a history of afib (atrial fibrillation) or irregular heart rhythm?  No   Have you checked your BP or HR? (document readings if available):  No readings available  Are you experiencing any other symptoms?  Occasional lightheadedness when standing  STAT if patient feels like he/she is going to faint   Are you dizzy now?  No   Do you feel faint or have you passed out?  No   Do you have any other symptoms? No--dizziness only occurs occasionally when standing.  Have you checked your HR and BP (record if available)?  No

## 2020-12-11 NOTE — Telephone Encounter (Signed)
Called and spoke to patients wife Misty Stanley who is on [place]), states patient is not home he went to the [facility]. Advised there have not been any alerts triggered on his device from 08/08/20 through today. Advised to continue to PCP follow up and ED precautions if needed. Appreciative of follow up call.

## 2020-12-11 NOTE — Telephone Encounter (Signed)
Called and spoke to the patient's wife Misty Stanley, attempted patient's phone first. (No answer, voice mail full) ~ 7/25 patient had an episode going up the stairs. On Saturday 7/30 the patient went hiking. "the first 100 yards,he had to stop because his heart was racing so fast, he said it felt like it was going to explode". No more episodes since the hike on 7/30. Patient is taking Toprol 25 mg daily with no missed doses. He has not felt well since Saturday, fullness in his stomach even though he hasn't eaten, mild headaches, decreased stamina. Has used Pepcid and Ibuprofen. Headaches resolve with medication, no change with "fullness". No BP and HR readings available. No changes in lifestyle habits or stress. No, nausea, vomiting, dizziness, chest pain, or pre syncope. Patient made appointment with PCP on Monday. ED precautions given. Verbalized understanding.  Will forward to device for remote transmission of device.

## 2020-12-17 ENCOUNTER — Telehealth: Payer: Self-pay

## 2020-12-17 ENCOUNTER — Ambulatory Visit: Payer: 59 | Admitting: Family Medicine

## 2020-12-17 ENCOUNTER — Other Ambulatory Visit: Payer: Self-pay

## 2020-12-17 ENCOUNTER — Encounter: Payer: Self-pay | Admitting: Family Medicine

## 2020-12-17 VITALS — BP 120/72 | HR 56 | Temp 96.9°F | Ht 73.0 in | Wt 199.0 lb

## 2020-12-17 DIAGNOSIS — R000 Tachycardia, unspecified: Secondary | ICD-10-CM

## 2020-12-17 DIAGNOSIS — Z8673 Personal history of transient ischemic attack (TIA), and cerebral infarction without residual deficits: Secondary | ICD-10-CM

## 2020-12-17 LAB — COMPREHENSIVE METABOLIC PANEL
ALT: 86 U/L — ABNORMAL HIGH (ref 0–53)
AST: 41 U/L — ABNORMAL HIGH (ref 0–37)
Albumin: 4.1 g/dL (ref 3.5–5.2)
Alkaline Phosphatase: 55 U/L (ref 39–117)
BUN: 19 mg/dL (ref 6–23)
CO2: 29 mEq/L (ref 19–32)
Calcium: 9.3 mg/dL (ref 8.4–10.5)
Chloride: 104 mEq/L (ref 96–112)
Creatinine, Ser: 0.98 mg/dL (ref 0.40–1.50)
GFR: 81.48 mL/min (ref >60.00)
Glucose, Bld: 102 mg/dL — ABNORMAL HIGH (ref 70–99)
Potassium: 4.7 mEq/L (ref 3.5–5.1)
Sodium: 139 mEq/L (ref 135–145)
Total Bilirubin: 1.1 mg/dL (ref 0.2–1.2)
Total Protein: 6.3 g/dL (ref 6.0–8.3)

## 2020-12-17 LAB — CBC WITH DIFFERENTIAL/PLATELET
Basophils Absolute: 0 K/uL (ref 0.0–0.1)
Basophils Relative: 0.6 % (ref 0.0–3.0)
Eosinophils Absolute: 0.1 K/uL (ref 0.0–0.7)
Eosinophils Relative: 1.1 % (ref 0.0–5.0)
HCT: 46.8 % (ref 39.0–52.0)
Hemoglobin: 15.3 g/dL (ref 13.0–17.0)
Lymphocytes Relative: 16.3 % (ref 12.0–46.0)
Lymphs Abs: 1.1 K/uL (ref 0.7–4.0)
MCHC: 32.8 g/dL (ref 30.0–36.0)
MCV: 93 fl (ref 78.0–100.0)
Monocytes Absolute: 0.6 K/uL (ref 0.1–1.0)
Monocytes Relative: 9.9 % (ref 3.0–12.0)
Neutro Abs: 4.7 K/uL (ref 1.4–7.7)
Neutrophils Relative %: 72.1 % (ref 43.0–77.0)
Platelets: 170 K/uL (ref 150.0–400.0)
RBC: 5.04 Mil/uL (ref 4.22–5.81)
RDW: 13.4 % (ref 11.5–15.5)
WBC: 6.5 K/uL (ref 4.0–10.5)

## 2020-12-17 LAB — LIPID PANEL
Cholesterol: 86 mg/dL (ref 0–200)
HDL: 45 mg/dL (ref >39.00)
LDL Cholesterol: 25 mg/dL (ref 0–99)
NonHDL: 40.87
Total CHOL/HDL Ratio: 2
Triglycerides: 80 mg/dL (ref 0.0–149.0)
VLDL: 16 mg/dL (ref 0.0–40.0)

## 2020-12-17 LAB — TSH: TSH: 1.58 u[IU]/mL (ref 0.35–5.50)

## 2020-12-17 NOTE — Telephone Encounter (Signed)
Patient wife called in wanting to know if anything has been seen on patients transmission. Patient went to pcp and they told him to call and ask [place]. Let patient wife know a nurse will give her a call back

## 2020-12-17 NOTE — Telephone Encounter (Signed)
Successful telephone encounter to patient wife (on [place]) to follow up on her concern for AF events patient is possibly having. Per wife, patient was at PCP visit today when their son told her patient was "possibly in AF and having trigeminy". Wife concerned that loop may not be picking up events. Discussed loop settings. Reviewed today's EKG uploaded in Epic. P waves clearly noted. PVCs not new for patient. Also reviewed today's loop transmission in [facility]. Provided reassurance that device has not recorded any episodes since March 2022.

## 2020-12-17 NOTE — Progress Notes (Signed)
This visit occurred during the SARS-CoV-2 public health emergency.  Safety protocols were in place, including screening questions prior to the visit, additional usage of staff PPE, and extensive cleaning of exam room while observing appropriate contact time as indicated for disinfecting solutions.  One day going up stairs he noted heart racing.  Not with every time going up steps.  Last weekend when for a hike.  This wasn't atypical exercise for patient but he had heart racing with the start of the hike.  It got better as the hike went on.  Went 2.5 miles total, with sx in the first 200 yards.  Noted pulsation in neck recently.  Less stamina noted with exertion, noted in the last few weeks.  Prev could run 3 miles at [facility] but can't run as many laps now.    Early satiety in the last few weeks.  Feels bloated with eating.  Appetite is still good but he gets full quicker.    No CP.  He drinks multiple sodas with caffeine daily.  Meds, vitals, and allergies reviewed.   ROS: Per HPI unless specifically indicated in ROS section   GEN: nad, alert and oriented HEENT: ncat NECK: supple w/o LA CV: sounds to be in trigeminy, noted prior to EKG PULM: ctab, no inc wob ABD: soft, +bs EXT: no edema SKIN: no acute rash B symmetric pulsation in the neck that is symmetric.   B radial pulse is symmetric.   Carotid and radial pulsations don't always correspond.

## 2020-12-17 NOTE — Patient Instructions (Signed)
Gradually taper caffeine.  Go to the lab on the way out.   If you have mychart we'll likely use that to update you.    Limit exertion for now.  I'll update cardiology.  If any chest pain/heart racing/shortness of breath, then go to the ER.   Take care.  Glad to see you.

## 2020-12-18 ENCOUNTER — Telehealth: Payer: Self-pay | Admitting: Family Medicine

## 2020-12-18 DIAGNOSIS — R000 Tachycardia, unspecified: Secondary | ICD-10-CM | POA: Insufficient documentation

## 2020-12-18 DIAGNOSIS — R7989 Other specified abnormal findings of blood chemistry: Secondary | ICD-10-CM

## 2020-12-18 NOTE — Telephone Encounter (Signed)
I routed my notes over to cardiology.  Please call cardiology clinic to make sure Dr. Johney Frame is in today.  I would like his input.  I sent a note to the patient.  He has mild liver test elevations.  I want a recheck his liver tests in about 1 month.  Please have him set up a nonfasting lab visit.  I put in the order for liver ultrasound.  Please let him know that he should get a phone call about scheduling that.  Please make sure the patient is not taking extra Tylenol.  I did not ask him about alcohol use at the visit.  Please verify how much alcohol he is drinking, if any, and let me know.  Thanks.

## 2020-12-18 NOTE — Assessment & Plan Note (Signed)
History of, with decreased exercise tolerance.  In trigeminy on exam today, EKG reviewed with patient.  No ischemic/urgent findings.  He has monitor in place.  Unclear if he had episode of A. fib or another form of tachycardia with episodes described above.  Would continue beta-blocker.  See after visit summary.  Routine cautions given to patient.  We will update cardiology.  See notes on labs.  At this point, still okay for outpatient follow-up.  Plan discussed with patient and son.  They agree with plan.

## 2020-12-19 ENCOUNTER — Telehealth: Payer: Self-pay | Admitting: Internal Medicine

## 2020-12-19 NOTE — Telephone Encounter (Signed)
Noted. Thanks.

## 2020-12-19 NOTE — Telephone Encounter (Signed)
Spoke with patient about his results. He is scheduled for liver US on 12/25/20 at 9:00 am and will recheck labs on 01/21/21 at 9:00 am. Patient states that he does not drink much alcohol; states he may have a couple of beers a month. Advised patient to avoid any extra tylenol.   I called over to Cardiology office and left a message with them about getting Dr. Amedeo Plenty input on this patient. Message is being sent to him and someone will call back once he responds.

## 2020-12-19 NOTE — Telephone Encounter (Signed)
Please check with cardiology today.  Thanks.

## 2020-12-19 NOTE — Telephone Encounter (Signed)
Shanda Bumps from [facility] states Dr. Crawford Givens routed Dr. Johney Frame notes and he would like to know if they were received. She states the patient will be having a liver ultrasound 8/16 and they will recheck his liver test next month. [phone]

## 2020-12-19 NOTE — Telephone Encounter (Signed)
Left message that we received information faxed to [place].

## 2020-12-20 NOTE — Telephone Encounter (Signed)
Received message below from access nurse:    [name] is [name] from Dr. Johney Frame at [facility] care [phone]) returning a call from [place] who wanted to know if the office rec'd the faxed info for patient Jeffery Willis, DOB [birth year]. States they have rec'd info.

## 2020-12-21 NOTE — Telephone Encounter (Signed)
Noted. Thanks.

## 2020-12-25 ENCOUNTER — Ambulatory Visit
Admission: RE | Admit: 2020-12-25 | Discharge: 2020-12-25 | Disposition: A | Discharge status: Home / Self Care | Payer: 59 | Source: Ambulatory Visit | Attending: Family Medicine | Admitting: Family Medicine

## 2020-12-25 ENCOUNTER — Other Ambulatory Visit: Payer: Self-pay

## 2020-12-25 DIAGNOSIS — R7989 Other specified abnormal findings of blood chemistry: Secondary | ICD-10-CM | POA: Diagnosis not present

## 2020-12-25 IMAGING — US US ABDOMEN LIMITED
1 series · 14 of 25 positions shown · non-contrast
Comparison: CT urogram [DATE]

CLINICAL DATA: Elevated liver function test.

EXAM:
ULTRASOUND ABDOMEN LIMITED RIGHT UPPER QUADRANT

[Series 1: us abdomen limited · 0.20mm/px · 14 of 57 slices shown]
[im 1/57]
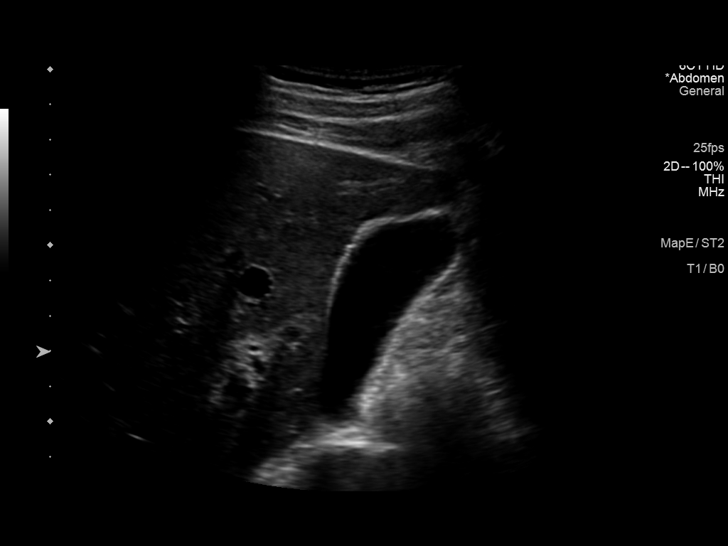
[im 5/57]
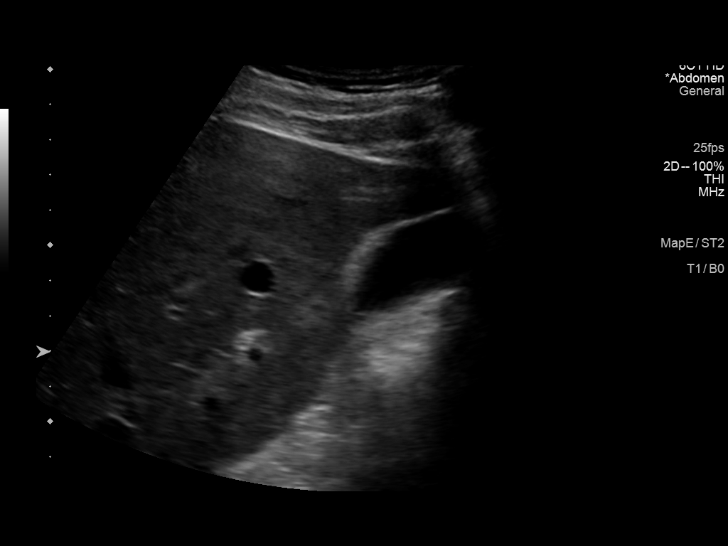
[im 10/57]
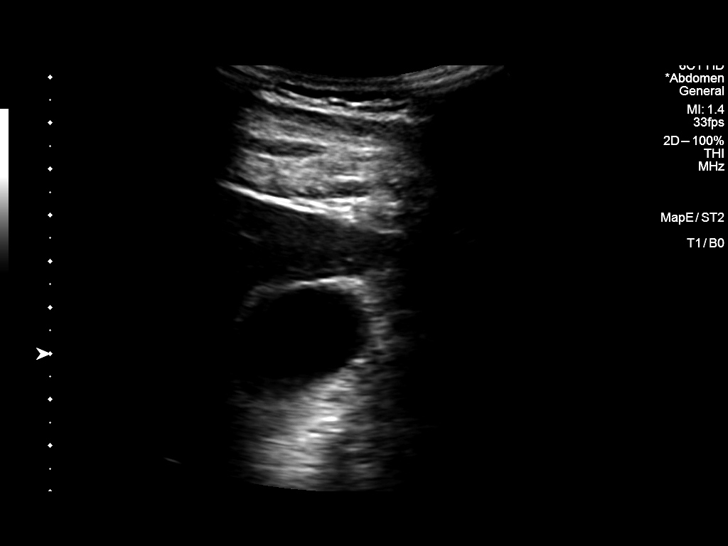
[im 15/57]
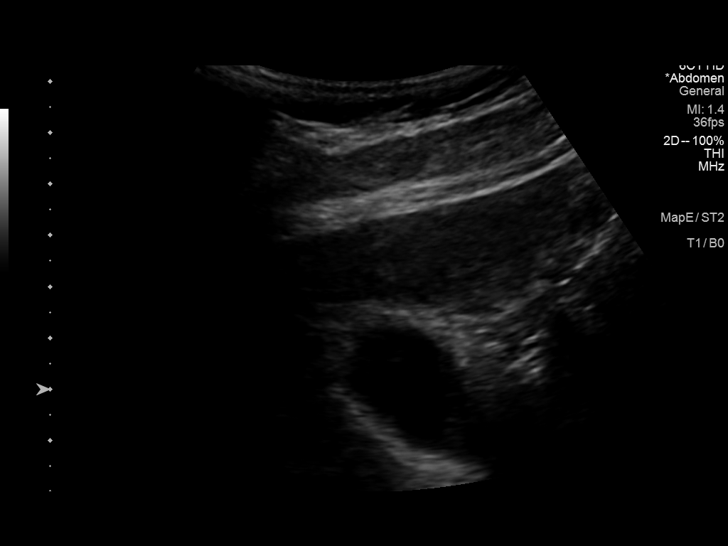
[im 19/57]
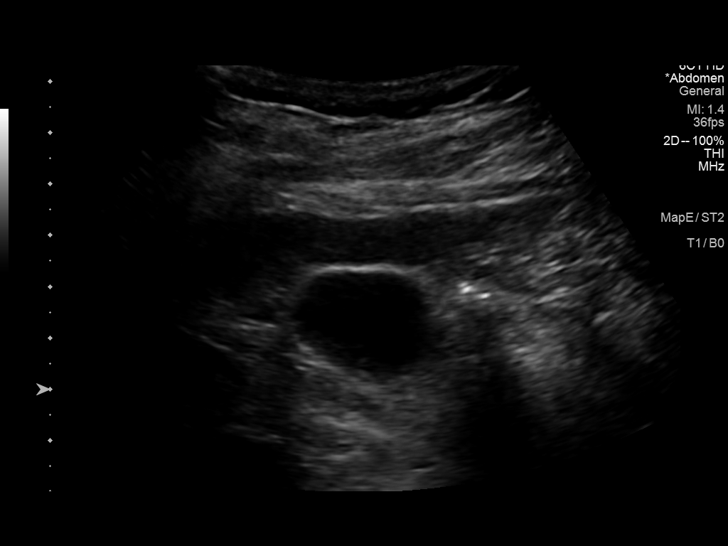
[im 22/57]
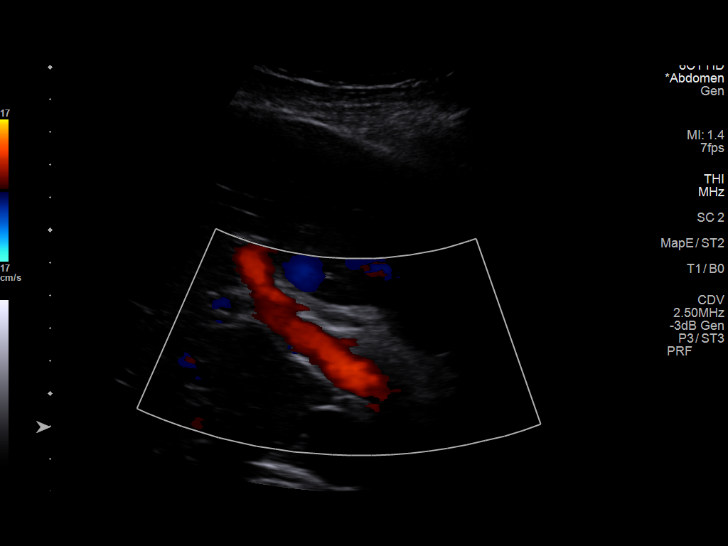
[im 26/57]
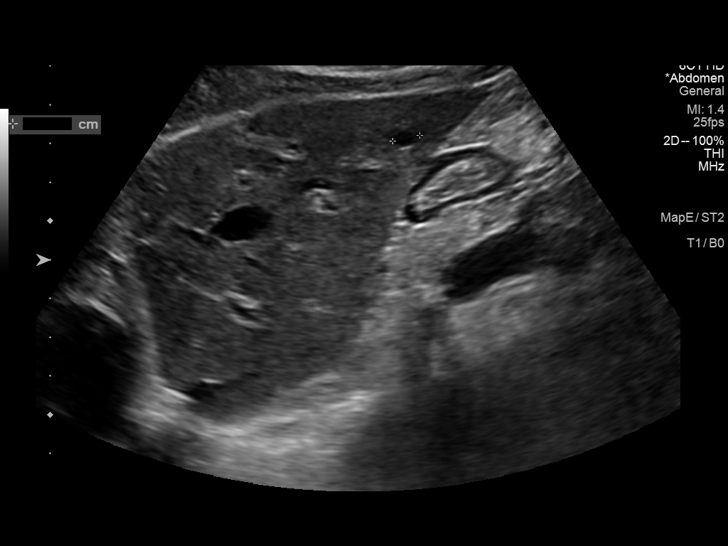
[im 31/57]
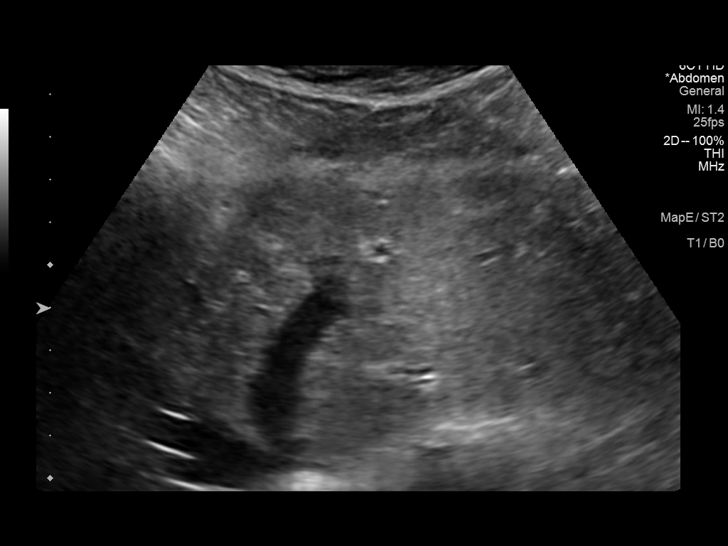
[im 36/57]
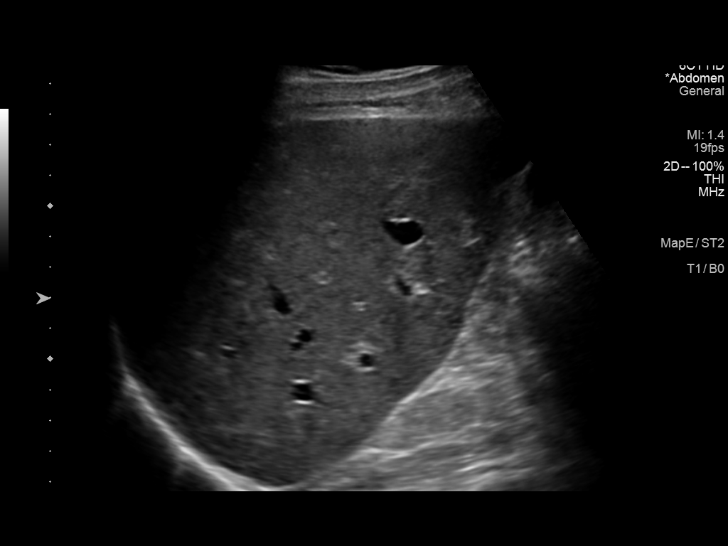
[im 38/57]
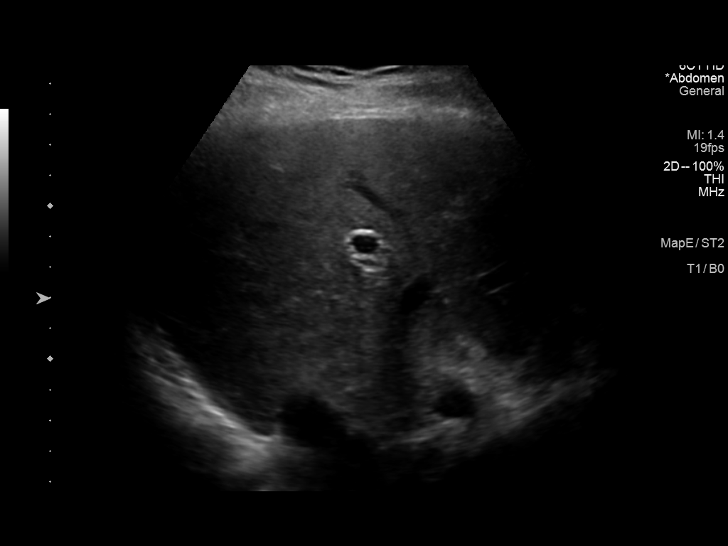
[im 43/57]
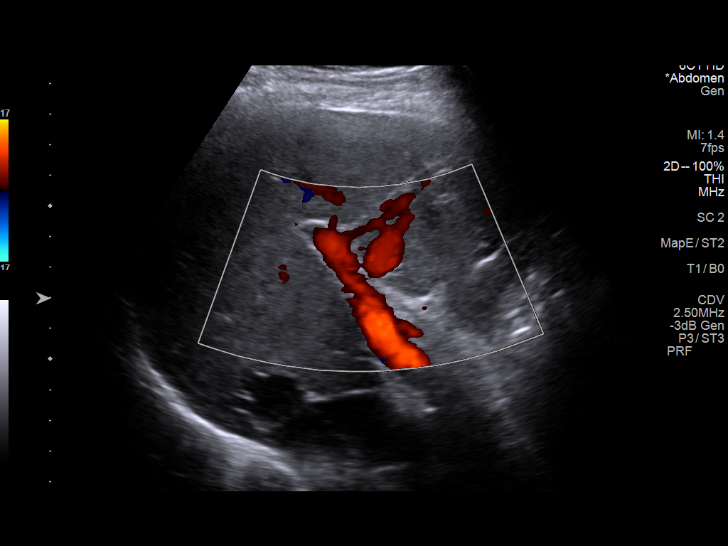
[im 47/57]
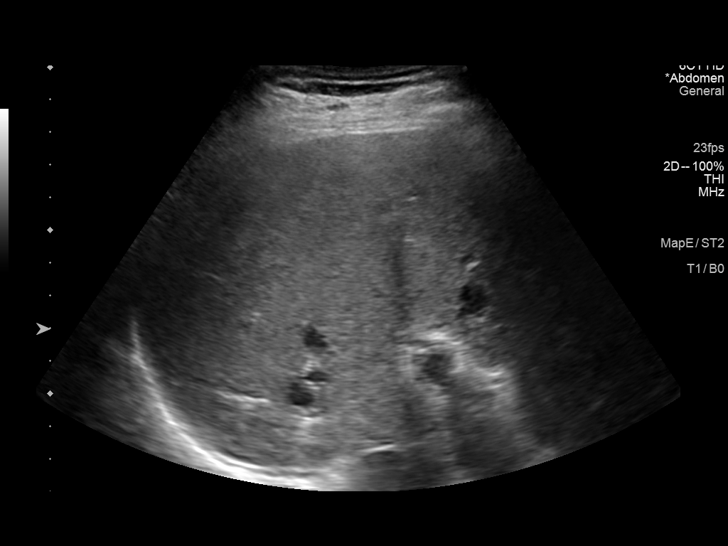
[im 52/57]
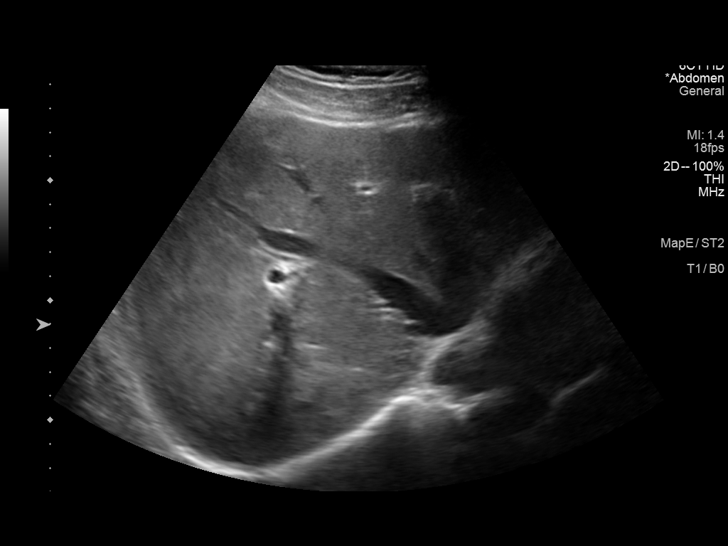
[im 57/57]
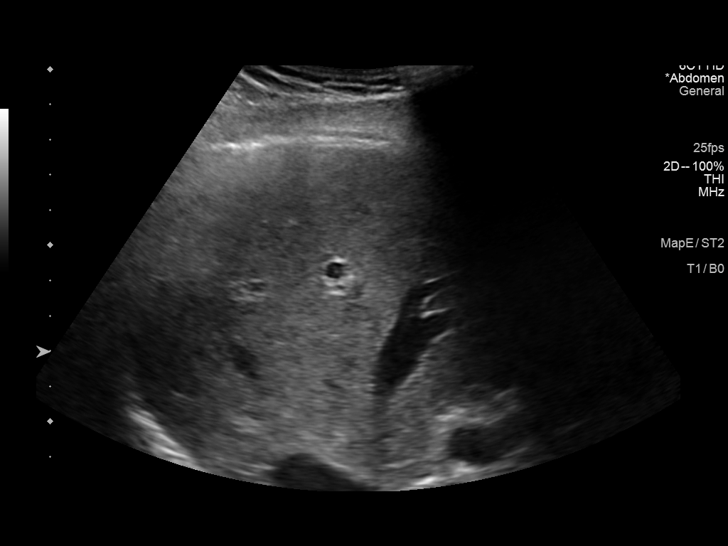

[14 of 25 positions shown; findings below may reference images not displayed]

FINDINGS: Gallbladder:

No gallstones or wall thickening visualized. No sonographic Murphy
sign noted by sonographer.

Common bile duct:

Diameter: 3 mm.

Liver:

There is a 0.7 x 0.6 x 0.6 cm left hepatic lobe cyst. Otherwise no
focal lesion identified. Heterogeneous parenchymal echogenicity.
Portal vein is patent on color Doppler imaging with normal direction
of blood flow towards the liver.

Other: None.
IMPRESSION: Heterogeneous hepatic parenchyma of unclear etiology. Finding may be
artifactual. Consider MRI liver protocol if clinically indicated.

## 2021-01-03 NOTE — Progress Notes (Signed)
Carelink Summary Report / Loop Recorder 

## 2021-01-11 ENCOUNTER — Ambulatory Visit (INDEPENDENT_AMBULATORY_CARE_PROVIDER_SITE_OTHER): Payer: 59

## 2021-01-11 DIAGNOSIS — I639 Cerebral infarction, unspecified: Secondary | ICD-10-CM

## 2021-01-15 LAB — CUP PACEART REMOTE DEVICE CHECK
Date Time Interrogation Session: 20220831070708
Implantable Pulse Generator Implant Date: 20210928

## 2021-01-21 ENCOUNTER — Encounter: Payer: Self-pay | Admitting: Family Medicine

## 2021-01-21 ENCOUNTER — Other Ambulatory Visit: Payer: 59

## 2021-01-21 ENCOUNTER — Other Ambulatory Visit: Payer: Self-pay

## 2021-01-21 ENCOUNTER — Ambulatory Visit: Payer: 59 | Admitting: Family Medicine

## 2021-01-21 DIAGNOSIS — R7989 Other specified abnormal findings of blood chemistry: Secondary | ICD-10-CM | POA: Diagnosis not present

## 2021-01-21 DIAGNOSIS — R008 Other abnormalities of heart beat: Secondary | ICD-10-CM

## 2021-01-21 LAB — HEPATIC FUNCTION PANEL
ALT: 89 U/L — ABNORMAL HIGH (ref 0–53)
AST: 54 U/L — ABNORMAL HIGH (ref 0–37)
Albumin: 4.3 g/dL (ref 3.5–5.2)
Alkaline Phosphatase: 58 U/L (ref 39–117)
Bilirubin, Direct: 0.3 mg/dL (ref 0.0–0.3)
Total Bilirubin: 1.1 mg/dL (ref 0.2–1.2)
Total Protein: 7 g/dL (ref 6.0–8.3)

## 2021-01-21 NOTE — Patient Instructions (Addendum)
Go to the lab on the way out.   If you have mychart we'll likely use that to update you.    Keep the cardiology appointment.   Gradually taper caffeine as tolerated.  Take care.  Glad to see you.`

## 2021-01-21 NOTE — Progress Notes (Signed)
This visit occurred during the SARS-CoV-2 public health emergency.  Safety protocols were in place, including screening questions prior to the visit, additional usage of staff PPE, and extensive cleaning of exam room while observing appropriate contact time as indicated for disinfecting solutions.  LFT elevation prev noted.  Prev abd bloating resolved.  He feels better, doesn't have post meal sx and feels better overall.    Recent report on LINQ: No arrhythmias Normal histogram  He cut back a little on caffeine, tapering slowly.  Unclear if that contributed to clinical improvement.  He has cardiology f/u pending.    No FCNAVD. No blood in stool.  No sensation of skipped beats.  He feels good walking.  [facility] but didn't try running.  No syncope.  Not lightheaded.   Meds, vitals, and allergies reviewed.   ROS: Per HPI unless specifically indicated in ROS section   Nad Ncat Neck supple, no LA Occ ectopy noted vs trigeminy but not tachycardic.   Abd not ttp. Normal BS.  Ext w/o edema.  Skin w/o rash.   No BLE edema.

## 2021-01-23 ENCOUNTER — Other Ambulatory Visit: Payer: Self-pay | Admitting: Family Medicine

## 2021-01-23 DIAGNOSIS — R7989 Other specified abnormal findings of blood chemistry: Secondary | ICD-10-CM

## 2021-01-23 DIAGNOSIS — R008 Other abnormalities of heart beat: Secondary | ICD-10-CM | POA: Insufficient documentation

## 2021-01-23 DIAGNOSIS — R933 Abnormal findings on diagnostic imaging of other parts of digestive tract: Secondary | ICD-10-CM

## 2021-01-23 NOTE — Assessment & Plan Note (Addendum)
Unclear source.  Previous imaging and liver test discussed with patient.  See notes on follow-up labs.  Abdomen nontender.  No jaundice.  He feels better overall.  Okay for outpatient follow-up.  35 minutes were devoted to patient care in this encounter (this includes time spent reviewing the patient's file/history, interviewing and examining the patient, counseling/reviewing plan with patient).

## 2021-01-23 NOTE — Assessment & Plan Note (Signed)
Sounds still to be in trigeminy with cardiology follow-up pending.  He is overall feeling better.  No chest pain.  No syncope.  Not lightheaded.  [facility] [place] [facility] but I advised him not to do anything strenuous or restart running in the meantime.  I will await cardiology follow-up.

## 2021-01-23 NOTE — Progress Notes (Signed)
Carelink Summary Report / Loop Recorder 

## 2021-01-28 ENCOUNTER — Other Ambulatory Visit (INDEPENDENT_AMBULATORY_CARE_PROVIDER_SITE_OTHER): Payer: 59

## 2021-01-28 ENCOUNTER — Other Ambulatory Visit: Payer: Self-pay

## 2021-01-28 DIAGNOSIS — R933 Abnormal findings on diagnostic imaging of other parts of digestive tract: Secondary | ICD-10-CM | POA: Diagnosis not present

## 2021-01-28 DIAGNOSIS — R7989 Other specified abnormal findings of blood chemistry: Secondary | ICD-10-CM | POA: Diagnosis not present

## 2021-01-29 LAB — HEPATITIS PANEL, ACUTE
Hep A IgM: NONREACTIVE
Hep B C IgM: NONREACTIVE
Hepatitis B Surface Ag: NONREACTIVE
Hepatitis C Ab: NONREACTIVE
SIGNAL TO CUT-OFF: 0.06 (ref <1.00)

## 2021-02-04 NOTE — Progress Notes (Signed)
Electrophysiology Office Note Date: 02/05/2021  ID:  Jeffery Willis, DOB 1956-03-10, MRN [id number]  PCP: Joaquim Nam, MD Primary Cardiologist: None Electrophysiologist: Hillis Range, MD   CC: ILR follow-up  Jeffery Willis is a 65 y.o. male seen today for Dr. Johney Frame . he presents today for routine electrophysiology followup.  Since last being seen in our clinic, the patient reports doing very well. He has been having PVCs as often as trigeminy. He is asymptomatic. he denies chest pain, palpitations, dyspnea, PND, orthopnea, nausea, vomiting, dizziness, syncope, edema, weight gain, or early satiety.  Device History: MDT linq II implanted 02/07/20, cryptogenic stroke  Past Medical History:  Diagnosis Date   Achilles tendonitis 10/08/2017   Calculus of kidney 02/14/2010   Cerebrovascular accident 02/04/2020   L PCA stroke with hippocampal involvement   Generalized anxiety disorder    History of cardiovascular stress test 06/2005   normal myocardial perfusion. Given these results and the patient's persistence of flipped T waves, I think these are likely asymmptomatic and clinically insignificant findings   Mild vascular neurocognitive disorder 11/15/2020   Skin lesion    Past Surgical History:  Procedure Laterality Date   2 D Echo  06/2005   normal LV function and no LVH.  Normal 2D echowith ER60 to 70%   BUBBLE STUDY  02/07/2020   Procedure: BUBBLE STUDY;  Surgeon: [name], MD;  Location: [facility] ENDOSCOPY;  Service: Cardiovascular;;   KIDNEY STONE SURGERY     LOOP RECORDER INSERTION N/A 02/07/2020   Procedure: LOOP RECORDER INSERTION;  Surgeon: Hillis Range, MD;  Location: [facility] INVASIVE CV LAB;  Service: Cardiovascular;  Laterality: N/A;   TEE WITHOUT CARDIOVERSION N/A 02/07/2020   Procedure: TRANSESOPHAGEAL ECHOCARDIOGRAM (TEE);  Surgeon: [name], MD;  Location: [facility] ENDOSCOPY;  Service: Cardiovascular;  Laterality: N/A;    Current Outpatient Medications   Medication Sig Dispense Refill   atorvastatin (LIPITOR) 40 MG tablet Take 40 mg by mouth daily.     clopidogrel (PLAVIX) 75 MG tablet Take 75 mg by mouth daily.     escitalopram (LEXAPRO) 10 MG tablet Take 1 tablet (10 mg total) by mouth daily.     metoprolol succinate (TOPROL XL) 25 MG 24 hr tablet Take 1 tablet (25 mg total) by mouth daily. 90 tablet 3   No current facility-administered medications for this visit.    Allergies:   Patient has no known allergies.   Social History: Social History   Socioeconomic History   Marital status: Married    Spouse name: Not on file   Number of children: 2   Years of education: 16   Highest education level: Bachelor's degree (e.g., BA, AB, BS)  Occupational History   Occupation: Teacher, early years/pre    Comment: works Engineering geologist in [employer]  Tobacco Use   Smoking status: Never   Smokeless tobacco: Never  Vaping Use   Vaping Use: Never used  Substance and Sexual Activity   Alcohol use: No   Drug use: No   Sexual activity: Not on file  Other Topics Concern   Not on file  Social History Narrative   Lives with wife   Right Handed   Drinks 3-4 cups caffeine daily   Social Determinants of Health   Financial Resource Strain: Not on file  Food Insecurity: Not on file  Transportation Needs: Not on file  Physical Activity: Not on file  Stress: Not on file  Social Connections: Not on file  Intimate Partner Violence: Not on file  Family History: Family History  Problem Relation Age of Onset   Heart disease Mother        arrhythmia   Stroke Mother    Hypertension Father        mild   Arthritis Father    Memory loss Maternal Grandmother        possible undiagnosed Alzheimer's disease   Prostate cancer Paternal Grandfather    Colon cancer Neg Hx      Review of Systems: All other systems reviewed and are otherwise negative except as noted above.  Physical Exam: Vitals:   02/05/21 0902  BP: 136/82  Pulse: 80  SpO2: 98%  Weight:  196 lb 12.8 oz (89.3 kg)  Height: 6\' 1"  (1.854 m)     GEN- The patient is well appearing, alert and oriented x 3 today.   HEENT: normocephalic, atraumatic; sclera clear, conjunctiva pink; hearing intact; oropharynx clear; neck supple  Lungs- Clear to ausculation bilaterally, normal work of breathing.  No wheezes, rales, rhonchi Heart- Regular rate and rhythm, no murmurs, rubs or gallops  GI- soft, non-tender, non-distended, bowel sounds present  Extremities- no clubbing, cyanosis, or edema  MS- no significant deformity or atrophy Skin- warm and dry, no rash or lesion; PPM pocket well healed Psych- euthymic mood, full affect Neuro- strength and sensation are intact  PPM Interrogation- reviewed in detail today,  See PACEART report  EKG:  EKG is not ordered today.   Recent Labs: 07/05/2020: Magnesium 2.1 12/17/2020: BUN 19; Creatinine, Ser 0.98; Hemoglobin 15.3; Platelets 170.0; Potassium 4.7; Sodium 139; TSH 1.58 01/21/2021: ALT 89   Wt Readings from Last 3 Encounters:  02/05/21 196 lb 12.8 oz (89.3 kg)  01/21/21 194 lb 9.6 oz (88.3 kg)  12/17/20 199 lb (90.3 kg)     Other studies Reviewed: Additional studies/ records that were reviewed today include: Previous EP office notes, Previous remote checks, Most recent labwork.   Assessment and Plan:  1. Cryptogenic Stroke s/p Medtronic Loop recorder Normal device function See Pace Art report No changes today  2. Wide complex tachycardia / PVCs Likely hemodynamically stable VT Normal Echo and myoview Normal EKG Continue to monitor clinically Increase Toprol to 50 mg daily.   3. HTN Stable on current regimen. Increasing toprol as above.   Current medicines are reviewed at length with the patient today.    Disposition:   Follow up with EP APP in 3 Months. May need to consider further suppression of PVCs if frequency increases.    02/16/21, PA-C  02/05/2021 9:08 AM  [facility] [address] [place] [id number] (office) [phone] (fax)

## 2021-02-05 ENCOUNTER — Encounter: Payer: Self-pay | Admitting: Student

## 2021-02-05 ENCOUNTER — Other Ambulatory Visit: Payer: Self-pay

## 2021-02-05 ENCOUNTER — Ambulatory Visit: Payer: 59 | Admitting: Student

## 2021-02-05 VITALS — BP 136/82 | HR 80 | Ht 73.0 in | Wt 196.8 lb

## 2021-02-05 DIAGNOSIS — I639 Cerebral infarction, unspecified: Secondary | ICD-10-CM

## 2021-02-05 DIAGNOSIS — R000 Tachycardia, unspecified: Secondary | ICD-10-CM

## 2021-02-05 DIAGNOSIS — I472 Ventricular tachycardia: Secondary | ICD-10-CM

## 2021-02-05 MED ORDER — METOPROLOL SUCCINATE ER 50 MG PO TB24: 50.0000 mg | ORAL_TABLET | Freq: Every day | ORAL | 3 refills | Status: AC

## 2021-02-05 NOTE — Patient Instructions (Signed)
Medication Instructions:  Your physician has recommended you make the following change in your medication:   INCREASE: Metoprolol Succinate to 50mg daily  *If you need a refill on your cardiac medications before your next appointment, please call your pharmacy*   Lab Work: None  If you have labs (blood work) drawn today and your tests are completely normal, you will receive your results only by: MyChart Message (if you have MyChart) OR A paper copy in the mail If you have any lab test that is abnormal or we need to change your treatment, we will call you to review the results.   Follow-Up: At [facility], you and your health needs are our priority.  As part of our continuing mission to provide you with exceptional heart care, we have created designated Provider Care Teams.  These Care Teams include your primary Cardiologist (physician) and Advanced Practice Providers (APPs -  Physician Assistants and Nurse Practitioners) who all work together to provide you with the care you need, when you need it.   Your next appointment:   As scheduled 

## 2021-02-14 ENCOUNTER — Ambulatory Visit: Payer: 59 | Admitting: Neurology

## 2021-02-14 ENCOUNTER — Encounter: Payer: Self-pay | Admitting: Neurology

## 2021-02-14 VITALS — BP 130/73 | HR 62 | Ht 73.0 in | Wt 197.0 lb

## 2021-02-14 DIAGNOSIS — R413 Other amnesia: Secondary | ICD-10-CM | POA: Diagnosis not present

## 2021-02-14 DIAGNOSIS — Z8673 Personal history of transient ischemic attack (TIA), and cerebral infarction without residual deficits: Secondary | ICD-10-CM | POA: Diagnosis not present

## 2021-02-14 DIAGNOSIS — G3184 Mild cognitive impairment, so stated: Secondary | ICD-10-CM | POA: Diagnosis not present

## 2021-02-14 LAB — CUP PACEART REMOTE DEVICE CHECK
Date Time Interrogation Session: 20221003070720
Implantable Pulse Generator Implant Date: 20210928

## 2021-02-14 NOTE — Patient Instructions (Signed)
I had a long discussion with the patient regarding his mild memory difficulties and discussed mild cognitive impairment as well as his remote stroke and answered questions.  I recommend recheck EEG for silent seizures . I encouraged him to do memory compensation strategies as well as increase participation in cognitively challenging activities like solving crossword puzzles, playing bridge and sodoku.  Continue Plavix for secondary stroke prevention and maintain aggressive risk factor modification with strict control of hypertension with blood pressure goal below 140/90, lipids with LDL cholesterol goal below 70 mg percent and diabetes with hemoglobin A1c goal below 6.5%.  He will return for follow-up in 6 months or call earlier if necessary.

## 2021-02-14 NOTE — Progress Notes (Signed)
[facility] [address] [place]. [place] [address] [phone]       OFFICE FOLLOW UP VISIT NOTE  Mr. Guerino Caporale Date of Birth:  1956-05-06 Medical Record Number:  [id number]   Referring MD: Crawford Givens  Reason for Referral: Memory loss  HPI: Initial visit 10/23/2020: Mr. Crossan is a pleasant 65 Caucasian male seen today for office consultation visit for memory loss.  History is obtained from the patient and review of electronic medical records and personally reviewed available imaging films in PACS.  He has no significant past medical history except left PCA branch infarct of cryptogenic etiology in September 2021 with practically no significant residual deficits except right superior quadrantic vision loss.  He has noticed subjective memory difficulties for the last few months.  He states he has to at times think where he is going and has to concentrate harder to remember things.  He forgets generic names of medications which he has been prescribing for years as he works as a Teacher, early years/pre.  However if he thinks are enough it will come back.  Is still able to work and works to 3 days a week.  He is managing all his affairs independently however his wife is more concerned about his forgetfulness and short-term memory difficulties.  He denies any headaches, loss of consciousness, seizure, significant head injury.  He does have history of left medial temporal infarct in September 2021 of cryptogenic etiology.  He had a loop recorder placed and so far A. fib has not been found.  He had mild residual right upper quadrant vision loss which still persists.  He has some short-term memory difficulties following the stroke but had shown improvement in this recent worsening symptoms new.  He has had no recent brain imaging study done.  He has a referral to neuropsychologist and has an appointment next month.  He has no family history of Alzheimer's or dementia.  His dad had Parkinson's  disease. Update 02/14/2021 : He returns for follow-up after last visit 3 months ago.  He states he is doing well he feels subjectively is short-term memory is actually better though his wife does not think so.  He is tolerating Plavix well without bruising or bleeding.  His blood pressures well controlled today it is 130/73.  He remains on Lipitor which is tolerating well without side effects however he has had slightly elevated liver enzymes recently and his primary care physician has ordered an MRI scan of the liver which is pending.  Patient has a loop recorder in on last evaluation on 02/11/2021 no evidence of paroxysmal A. fib was found.  At the last visit he had lab work for reversible causes of memory loss and vitamin B12, TSH, RPR and homocystine were all normal.  EEG was ordered but for some reason has not been done.  MRI scan of the brain on 11/08/2020 shows old left deep temporal infarct and no acute abnormality.  He has no new complaints. ROS:   14 system review of systems is positive for memory loss, word finding difficulties only all other systems negative forgetfulness,  PMH:  Past Medical History:  Diagnosis Date   Achilles tendonitis 10/08/2017   Calculus of kidney 02/14/2010   Cerebrovascular accident 02/04/2020   L PCA stroke with hippocampal involvement   Generalized anxiety disorder    History of cardiovascular stress test 06/2005   normal myocardial perfusion. 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  [employer]  Tobacco Use   Smoking status: Never   Smokeless tobacco: Never  Vaping Use   Vaping Use: Never used  Substance and Sexual Activity   Alcohol use: No   Drug use: No   Sexual activity: Not on file  Other Topics Concern   Not on file  Social History Narrative   Lives with wife   Right Handed   Drinks 3-4 cups caffeine daily   Social Determinants of Health   Financial Resource Strain: Not on file  Food Insecurity: Not on file  Transportation Needs: Not on file  Physical Activity: Not on file  Stress: Not on file  Social Connections: Not on file  Intimate Partner Violence: Not on file    Medications:   Current Outpatient Medications on File Prior to Visit  Medication Sig Dispense Refill   atorvastatin (LIPITOR) 40 MG tablet Take 40 mg by mouth daily.     clopidogrel (PLAVIX) 75 MG tablet Take 75 mg by mouth daily.     escitalopram (LEXAPRO) 10 MG tablet Take 1 tablet (10 mg total) by mouth daily.     metoprolol succinate (TOPROL XL) 50 MG 24 hr tablet Take 1 tablet (50 mg total) by mouth daily. 90 tablet 3   No current facility-administered medications on file prior to visit.    Allergies:  No Known Allergies  Physical Exam General: well developed, well nourished, pleasant middle-age Caucasian male seated, in no evident distress Head: head normocephalic and atraumatic.   Neck: supple with no carotid or supraclavicular bruits Cardiovascular: regular rate and rhythm, no murmurs Musculoskeletal: no deformity Skin:  no rash/petichiae Vascular:  Normal pulses all extremities  Neurologic Exam Mental Status: Awake and fully alert. Oriented to place and time. Recent and remote memory intact. Attention span, concentration and fund of knowledge appropriate. Mood and affect appropriate.  On Montreal cognitive assessment he scored 28/30 with 2 deficits in delayed recall only.  He was able to name 15 animals which can walk on 4 legs.  Clock drawing score was 4/4.  On  geriatric depression scale score 2 which is not suggestive of depression. Cranial Nerves: Fundoscopic exam reveals sharp disc margins. Pupils equal, briskly reactive to light. Extraocular movements full without nystagmus. Visual fields show partial right superior quadrantanopsia to confrontation. Hearing intact. Facial sensation intact. Face, tongue, palate moves normally and symmetrically.  Motor: Normal bulk and tone. Normal strength in all tested extremity muscles. Sensory.: intact to touch , pinprick , position and vibratory sensation.  Coordination: Rapid alternating movements normal in all extremities. Finger-to-nose and heel-to-shin performed accurately bilaterally. Gait and Station: Arises from chair without difficulty. Stance is normal. Gait demonstrates normal stride length and balance . Able to heel, toe and tandem walk without difficulty.  Reflexes: 1+ and symmetric. Toes downgoing.   NIHSS  1 Modified Rankin  1    ASSESSMENT: 65 year old Caucasian male with subacute short-term memory and mild cognitive difficulties likely from mild cognitive impairment.  Remote history of left medial temporal infarct in September 2021  of cryptogenic etiology.  Vascular risk factors of hyperlipidemia hypertension and prior stroke.  He appears stable from neurovascular standpoint since her last cognitive issues.     PLANI had a long discussion with the patient regarding his mild memory difficulties and discussed mild cognitive impairment as well as his remote stroke and answered questions.  I recommend recheck EEG for silent seizures . I encouraged him to do memory compensation strategies as well as increase participation in cognitively challenging activities like solving crossword puzzles, playing bridge and sodoku.  Continue Plavix for secondary stroke prevention and maintain aggressive risk factor modification with strict control of hypertension with blood pressure goal below 140/90, lipids with LDL  cholesterol goal below 70 mg percent and diabetes with hemoglobin A1c goal below 6.5%.  He will return for follow-up in 6 months or call earlier if necessary. Greater than 50% time during this 25-minute consultation visit was spent in counseling and coordination of care about his memory loss and mild cognitive impairment and remote stroke and answering questions.  Delia Heady, MD Note: This document was prepared with digital dictation and possible smart phrase technology. Any transcriptional errors that result from this process are unintentional.

## 2021-02-18 ENCOUNTER — Ambulatory Visit (INDEPENDENT_AMBULATORY_CARE_PROVIDER_SITE_OTHER): Payer: 59

## 2021-02-18 DIAGNOSIS — I639 Cerebral infarction, unspecified: Secondary | ICD-10-CM

## 2021-02-20 ENCOUNTER — Other Ambulatory Visit: Payer: 59

## 2021-02-27 NOTE — Progress Notes (Signed)
Carelink Summary Report / Loop Recorder 

## 2021-03-04 ENCOUNTER — Ambulatory Visit
Admission: RE | Admit: 2021-03-04 | Discharge: 2021-03-04 | Disposition: A | Discharge status: Home / Self Care | Payer: 59 | Source: Ambulatory Visit | Attending: Family Medicine | Admitting: Family Medicine

## 2021-03-04 ENCOUNTER — Other Ambulatory Visit: Payer: Self-pay

## 2021-03-04 DIAGNOSIS — R7989 Other specified abnormal findings of blood chemistry: Secondary | ICD-10-CM

## 2021-03-04 DIAGNOSIS — K7689 Other specified diseases of liver: Secondary | ICD-10-CM | POA: Diagnosis not present

## 2021-03-04 DIAGNOSIS — R933 Abnormal findings on diagnostic imaging of other parts of digestive tract: Secondary | ICD-10-CM

## 2021-03-04 IMAGING — MR MR ABDOMEN WO/W CM
12 of 18 series · 29 of 48 positions shown · IV contrast (multihance)
Comparison: Ultrasound [DATE]

CLINICAL DATA: Elevated liver function test. Heterogeneous liver on
hepatic ultrasound.

EXAM:
MRI ABDOMEN WITHOUT AND WITH CONTRAST
TECHNIQUE: Multiplanar multisequence MR imaging of the abdomen was performed
both before and after the administration of intravenous contrast.
CONTRAST:  18mL MULTIHANCE GADOBENATE DIMEGLUMINE 529 MG/ML IV SOLN

[Series 4: axial haste · axial · 6.0mm · 0.68mm/px · z∈[-205,+46]mm · 2 of 39 slices shown]
[im 1/39]
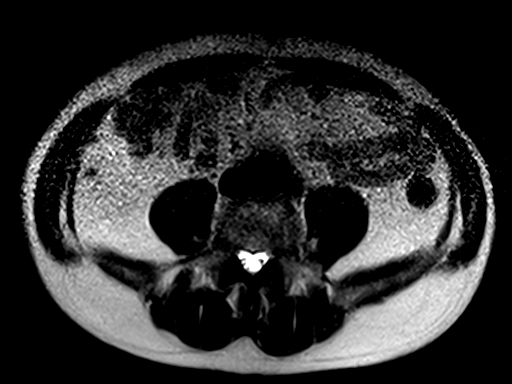
[im 39/39]
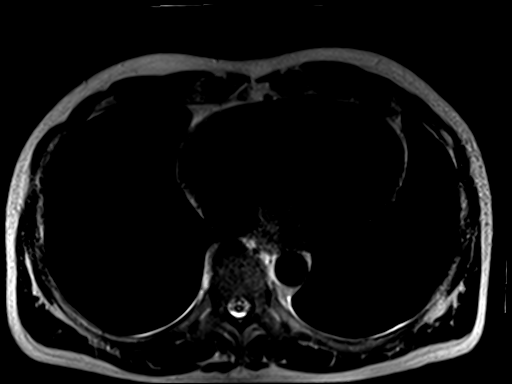

[Series 5: cor haste repeat · coronal · 5.0mm · 0.68mm/px · 2 of 32 slices shown]
[im 1/32]
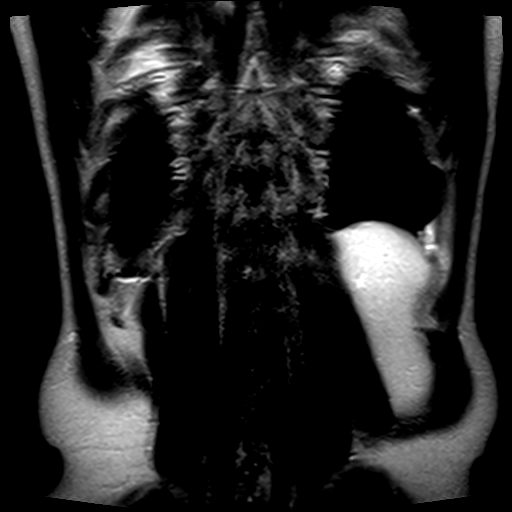
[im 32/32]
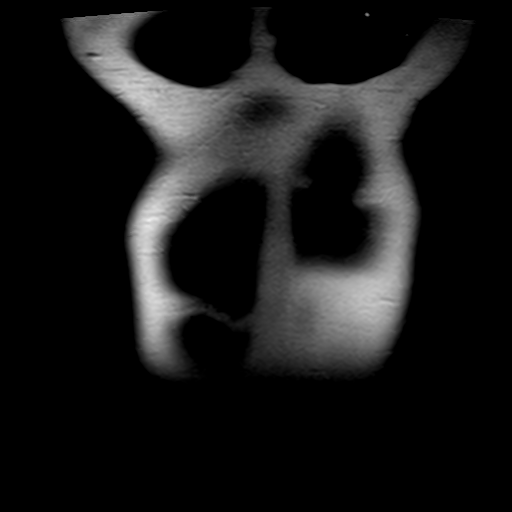

[Series 6: T1 · axial · 6.0mm · 0.68mm/px · z∈[-201,+30]mm · 3 of 72 slices shown]
[im 1/72]
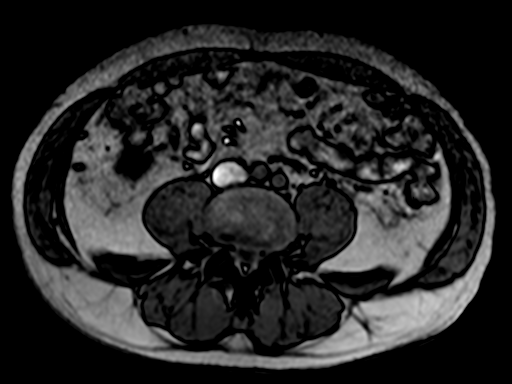
[im 36/72]
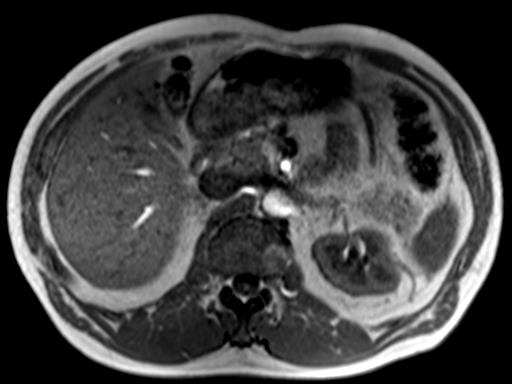
[im 72/72]
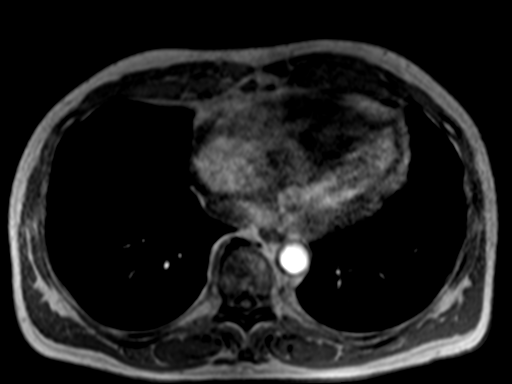

[Series 7: bSSFP · axial · 4.0mm · 0.68mm/px · z∈[-207,+37]mm · 3 of 62 slices shown]
[im 1/62]
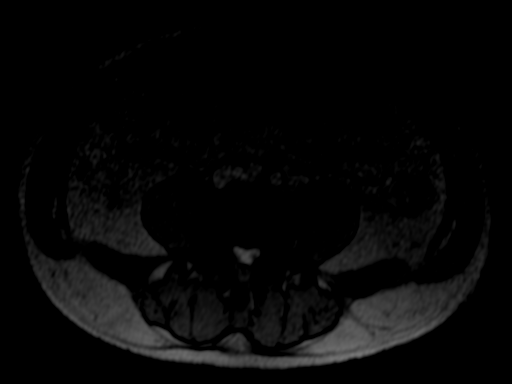
[im 31/62]
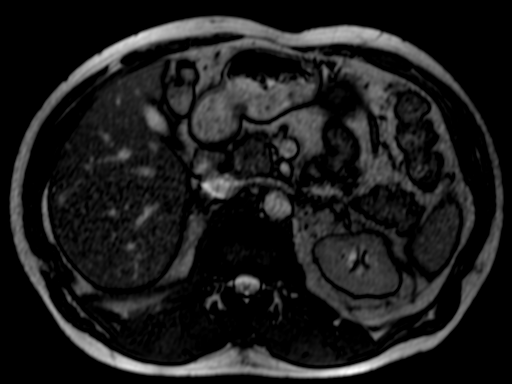
[im 62/62]
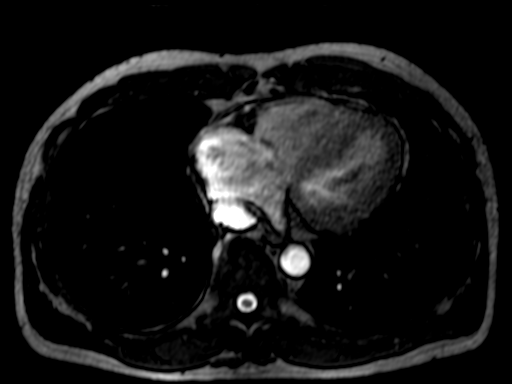

[Series 8: T2 fat-sat · axial · 6.0mm · 1.09mm/px · z∈[-190,+84]mm · 2 of 39 slices shown (1 of 2)]
[im 1/39]
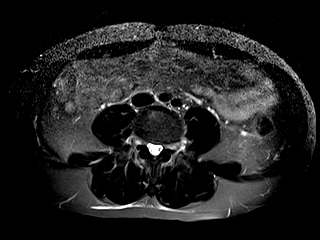
[im 39/39]
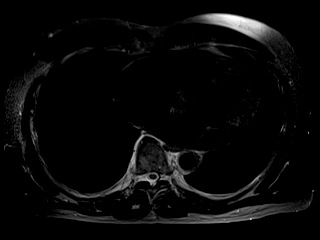

[Series 9: [id number]_trig · axial · 6.0mm · 1.82mm/px · z∈[-190,+84]mm · 4 of 117 slices shown]
[im 1/117]
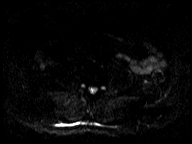
[im 39/117]
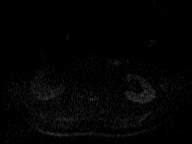
[im 78/117]
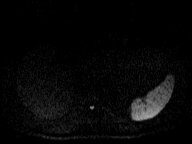
[im 117/117]
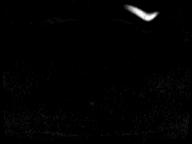

[Series 10: [id number]_trig_adc · axial · 6.0mm · 1.82mm/px · 1 of 39 slices shown]
[im 1/39]
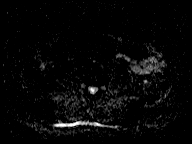

[Series 11: T2 fat-sat · axial · 6.0mm · 1.09mm/px · 1 of 39 slices shown (2 of 2)]
[im 1/39]
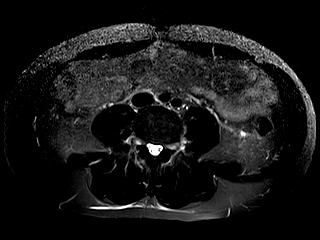

[Series 12: T1 dynamic · axial · non-contrast · 2.5mm · 0.74mm/px · z∈[-204,+33]mm · 3 of 96 slices shown]
[im 1/96]
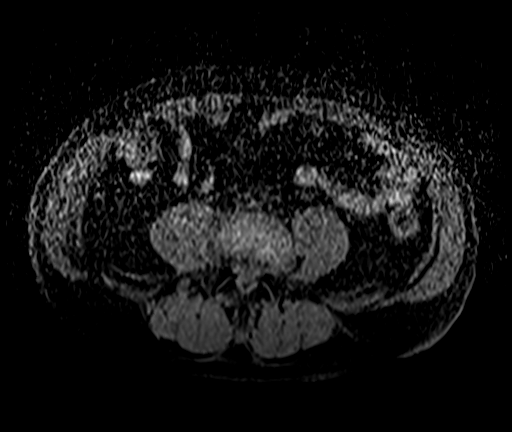
[im 48/96]
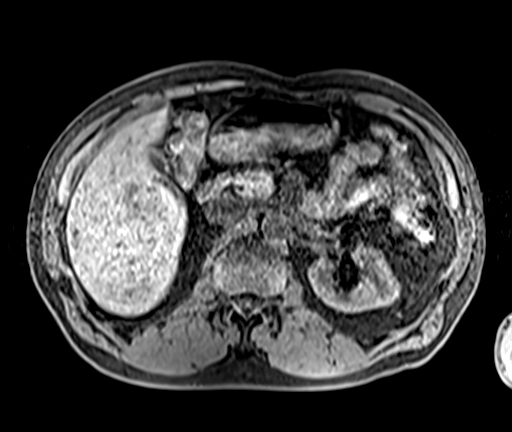
[im 96/96]
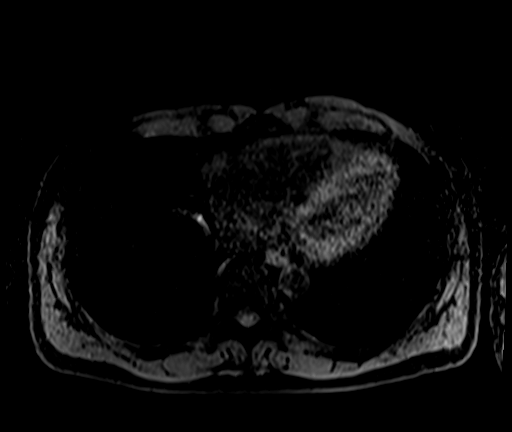

[Series 13: T1 dynamic post-contrast · axial · 2.5mm · 0.74mm/px · z∈[-204,+33]mm · 3 of 96 slices shown (1 of 3)]
[im 1/96]
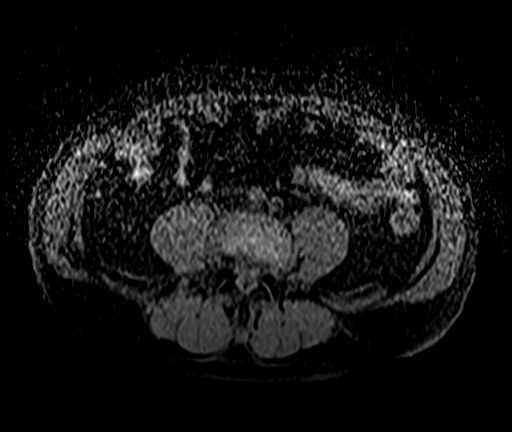
[im 48/96]
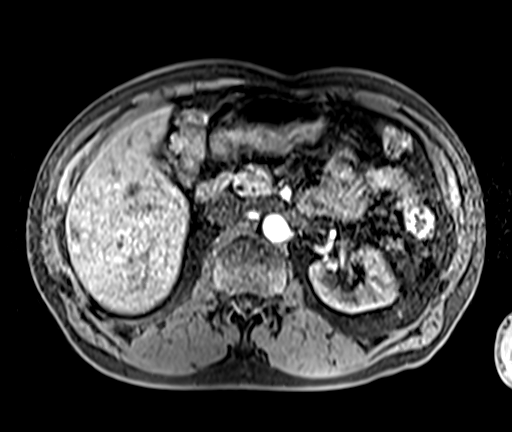
[im 96/96]
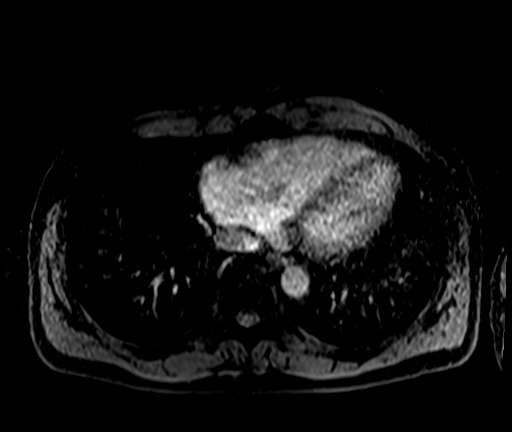

[Series 14: T1 dynamic post-contrast · axial · 2.5mm · 0.74mm/px · z∈[-204,+33]mm · 3 of 96 slices shown (2 of 3)]
[im 1/96]
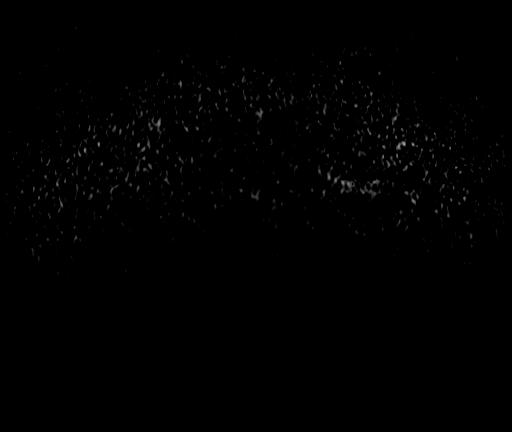
[im 48/96]
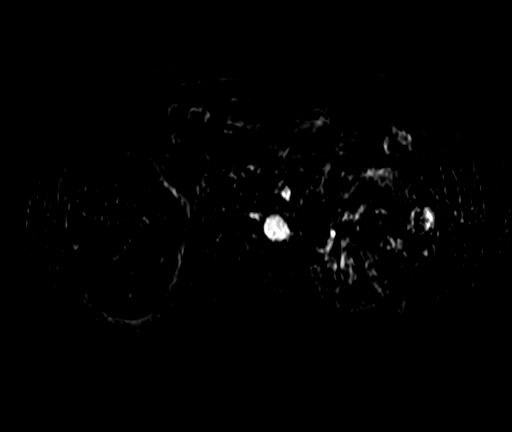
[im 96/96]
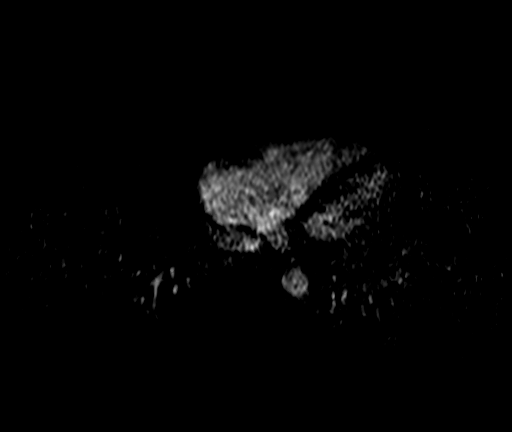

[Series 15: T1 dynamic post-contrast · axial · 2.5mm · 0.74mm/px · z∈[-204,-87]mm · 2 of 96 slices shown (3 of 3)]
[im 1/96]
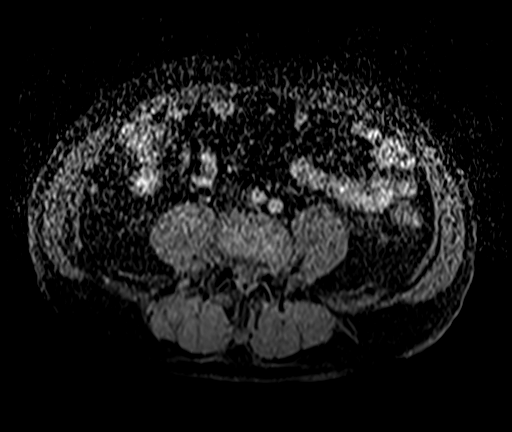
[im 48/96]
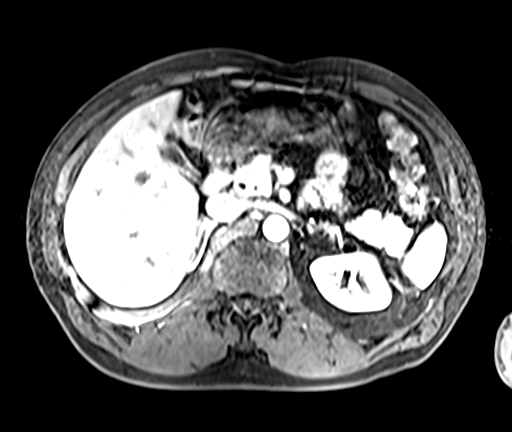

[29 of 48 positions shown; findings below may reference images not displayed]

FINDINGS: Lower chest:  Lung bases are clear.

Hepatobiliary: Multiple small benign-appearing hepatic cysts within
LEFT and RIGHT hepatic lobe are high signal intensity on T2 weighted
imaging. No biliary duct dilatation. Gallbladder is collapsed.
Common bile duct normal caliber.

Opposed phase imaging demonstrates no hepatic steatosis.
postcontrast imaging demonstrates no enhancing hepatic lesion.

Pancreas: Normal pancreatic parenchymal intensity. No ductal
dilatation or inflammation.

Spleen: Normal spleen.

Adrenals/urinary tract: Adrenal glands and kidneys are normal.

Stomach/Bowel: Stomach and limited of the small bowel is
unremarkable

Vascular/Lymphatic: Abdominal aortic normal caliber. No
retroperitoneal periportal lymphadenopathy.

Musculoskeletal: Small enhancing lesion in the lumbar spine
measuring 9 mm (image 13 image 53/series 15) is indeterminate.
IMPRESSION: 1. Frontal small benign hepatic cysts.
2. No hepatic parenchymal abnormality.
3. No biliary duct dilatation. Gallbladder and common bile duct
normal.
4. Normal pancreas.
5. Small enhancing lesion in the lumbar spine may represent
hemangioma. Consider dedicated lumbar MRI contrast if risk factors
for skeletal malignancy.

## 2021-03-04 MED ORDER — GADOBENATE DIMEGLUMINE 529 MG/ML IV SOLN
18.0000 mL | Freq: Once | INTRAVENOUS | Status: AC | PRN
  Administered 2021-03-04: 18 mL via INTRAVENOUS

## 2021-03-08 ENCOUNTER — Other Ambulatory Visit: Payer: Self-pay | Admitting: Family Medicine

## 2021-03-08 DIAGNOSIS — R7989 Other specified abnormal findings of blood chemistry: Secondary | ICD-10-CM

## 2021-03-18 ENCOUNTER — Ambulatory Visit: Payer: 59 | Admitting: Family Medicine

## 2021-03-18 ENCOUNTER — Encounter: Payer: Self-pay | Admitting: Family Medicine

## 2021-03-18 ENCOUNTER — Other Ambulatory Visit: Payer: Self-pay

## 2021-03-18 VITALS — BP 118/76 | HR 69 | Temp 97.8°F | Ht 73.0 in | Wt 195.0 lb

## 2021-03-18 DIAGNOSIS — I639 Cerebral infarction, unspecified: Secondary | ICD-10-CM

## 2021-03-18 DIAGNOSIS — R222 Localized swelling, mass and lump, trunk: Secondary | ICD-10-CM | POA: Diagnosis not present

## 2021-03-18 DIAGNOSIS — R008 Other abnormalities of heart beat: Secondary | ICD-10-CM

## 2021-03-18 DIAGNOSIS — R7989 Other specified abnormal findings of blood chemistry: Secondary | ICD-10-CM

## 2021-03-18 DIAGNOSIS — Z23 Encounter for immunization: Secondary | ICD-10-CM | POA: Diagnosis not present

## 2021-03-18 LAB — HEPATIC FUNCTION PANEL
ALT: 73 U/L — ABNORMAL HIGH (ref 0–53)
AST: 46 U/L — ABNORMAL HIGH (ref 0–37)
Albumin: 4.4 g/dL (ref 3.5–5.2)
Alkaline Phosphatase: 59 U/L (ref 39–117)
Bilirubin, Direct: 0.3 mg/dL (ref 0.0–0.3)
Total Bilirubin: 1.1 mg/dL (ref 0.2–1.2)
Total Protein: 6.5 g/dL (ref 6.0–8.3)

## 2021-03-18 NOTE — Patient Instructions (Signed)
Go to the lab on the way out.   If you have mychart we'll likely use that to update you.    If your liver tests are still elevated, then we need to see about stopping your statin vs seeing the GI clinic.  We'll call about the MRI.  I'll update cardiology and neurology.  Take care.  Glad to see you.

## 2021-03-18 NOTE — Progress Notes (Signed)
This visit occurred during the SARS-CoV-2 public health emergency.  Safety protocols were in place, including screening questions prior to the visit, additional usage of staff PPE, and extensive cleaning of exam room while observing appropriate contact time as indicated for disinfecting solutions.  H/o LFT elevation. Unclear if statin cause LFT elevation.  He was on statin with normal LFTs prior to recent LFT elevation. He has small benign liver cysts on imaging.  Likely clinically insignificant.  Discussed with patient.  No jaundice.  No abdominal pain.  Mood d/w pt.  He feels well.  He has been off lexapro for about 1 month and his mood is good.    There is one incidental finding on his imaging.  He has a small lesion in the lumbar spine that may represent a hemangioma.  Discussed that if there is any concern for (or elevated risk for) a bone cancer then it would be reasonable to get a dedicated lumbar MRI with contrast.  We talked about his situation at this point.  We agreed to recheck his liver test and then go from there.  See plan.  Also noted that neurology mentioned EEG.  I will ask for neurology input about this.  History of trigeminy.  Currently on beta-blocker.  See exam.  Tolerating beta-blocker without any complications.  Not having chest pain.  Not lightheaded.  Not short of breath  Meds, vitals, and allergies reviewed.   ROS: Per HPI unless specifically indicated in ROS section   GEN: nad, alert and oriented HEENT: ncat NECK: supple w/o LA CV: rrr with what sounds to be PAC versus PVC on every 6th beat instead of every third beat. PULM: ctab, no inc wob ABD: soft, +bs EXT: no edema SKIN: no acute rash

## 2021-03-19 LAB — CUP PACEART REMOTE DEVICE CHECK
Date Time Interrogation Session: 20221105070502
Implantable Pulse Generator Implant Date: 20210928

## 2021-03-20 ENCOUNTER — Telehealth: Payer: Self-pay | Admitting: Family Medicine

## 2021-03-20 DIAGNOSIS — R222 Localized swelling, mass and lump, trunk: Secondary | ICD-10-CM | POA: Insufficient documentation

## 2021-03-20 NOTE — Assessment & Plan Note (Addendum)
This could be from statin but he was on that prior to having any liver test elevation.  See notes on labs today.  If still elevated we can refer to GI.  I am hesitant to stop his statin in the meantime, given his situation.

## 2021-03-20 NOTE — Assessment & Plan Note (Signed)
Neurology notes mention EEG and I will inquire about that with neurology clinic.

## 2021-03-20 NOTE — Assessment & Plan Note (Addendum)
I think this is likely incidental but given his overall situation it makes sense to characterize the lesion, especially if his LFTs remain elevated.  MRI ordered.

## 2021-03-20 NOTE — Telephone Encounter (Signed)
Saw patient in follow-up.  He did not recall details regarding possible EEG.  Is your clinic going to schedule that and contact him?  I need your input.  Thanks.

## 2021-03-20 NOTE — Assessment & Plan Note (Addendum)
Now with ectopy decreased seemingly to every 6th beat.  I suspect this improvement is due to metoprolol use.  I will update cardiology and ask for input.  Would continue metoprolol as is.  Discussed.

## 2021-03-22 NOTE — Telephone Encounter (Signed)
Lilla Shook, RN  You; [place], Arlice Colt, [place], [place] 21 hours ago (10:25 AM)   Valli Glance or Delorise Jackson, Can you please reach out to this patient and schedule his EEG? Orders are already in Epic. Thank you.   Dr. Para March, We will get his EEG scheduled. Thank you.   ====== Noted. Thanks.

## 2021-03-25 ENCOUNTER — Ambulatory Visit (INDEPENDENT_AMBULATORY_CARE_PROVIDER_SITE_OTHER): Payer: 59

## 2021-03-25 ENCOUNTER — Other Ambulatory Visit: Payer: 59 | Admitting: Neurology

## 2021-03-25 DIAGNOSIS — I639 Cerebral infarction, unspecified: Secondary | ICD-10-CM

## 2021-03-25 DIAGNOSIS — R410 Disorientation, unspecified: Secondary | ICD-10-CM | POA: Diagnosis not present

## 2021-03-27 ENCOUNTER — Telehealth: Payer: Self-pay

## 2021-03-27 NOTE — Telephone Encounter (Signed)
-----   Message from Micki Riley, MD sent at 03/26/2021  4:49 PM EST ----- Kindly inform the patient that brainwave study was normal.  No seizure activity noted. ----- Message ----- From: Micki Riley, MD Sent: 03/25/2021   5:27 PM EST To: Micki Riley, MD

## 2021-03-27 NOTE — Telephone Encounter (Signed)
Sent mychart message

## 2021-04-02 NOTE — Progress Notes (Signed)
Carelink Summary Report / Loop Recorder 

## 2021-04-11 ENCOUNTER — Ambulatory Visit
Admission: RE | Admit: 2021-04-11 | Discharge: 2021-04-11 | Disposition: A | Discharge status: Home / Self Care | Payer: 59 | Source: Ambulatory Visit | Attending: Family Medicine | Admitting: Family Medicine

## 2021-04-11 DIAGNOSIS — M48061 Spinal stenosis, lumbar region without neurogenic claudication: Secondary | ICD-10-CM | POA: Diagnosis not present

## 2021-04-11 DIAGNOSIS — M5127 Other intervertebral disc displacement, lumbosacral region: Secondary | ICD-10-CM | POA: Diagnosis not present

## 2021-04-11 DIAGNOSIS — R222 Localized swelling, mass and lump, trunk: Secondary | ICD-10-CM

## 2021-04-11 IMAGING — MR MR LUMBAR SPINE WO/W CM
4 of 7 series · 22 of 48 positions shown · IV contrast (17ml Multihance)
Comparison: Abdominal MRI [DATE]

CLINICAL DATA: Lesion on MRI of the abdomen.

EXAM:
MRI LUMBAR SPINE WITHOUT AND WITH CONTRAST
TECHNIQUE: Multiplanar and multiecho pulse sequences of the lumbar spine were
obtained without and with intravenous contrast.
CONTRAST:  17mL MULTIHANCE GADOBENATE DIMEGLUMINE 529 MG/ML IV SOLN

[Series 3: T1 · sagittal · 4.0mm · 0.88mm/px · 3 of 14 slices shown (1 of 2)]
[im 1/14]
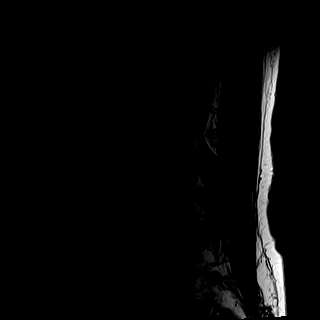
[im 7/14]
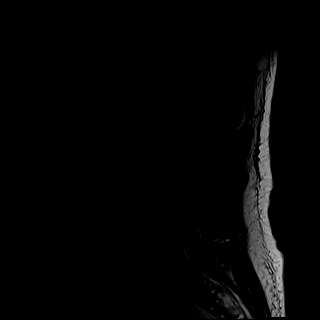
[im 14/14]
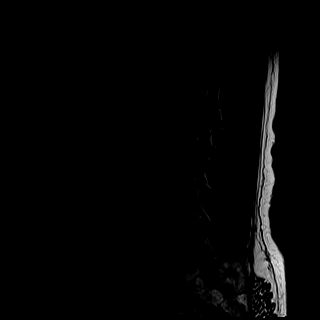

[Series 5: T2 · axial · 4.0mm · 0.39mm/px · z∈[-72,+181]mm · 9 of 49 slices shown (1 of 2)]
[im 1/49]
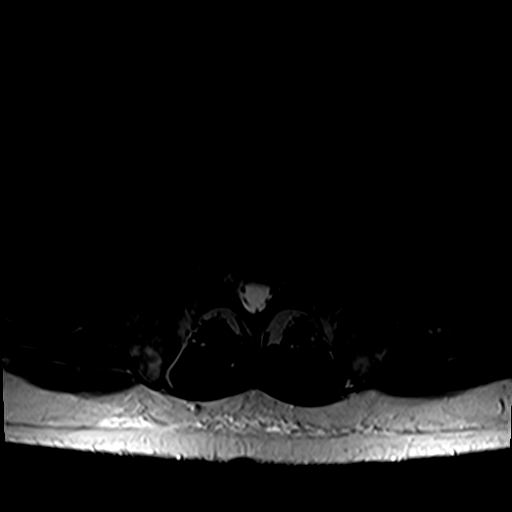
[im 9/49]
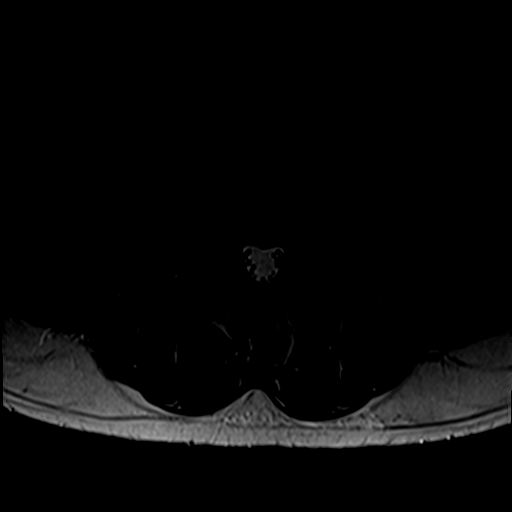
[im 14/49]
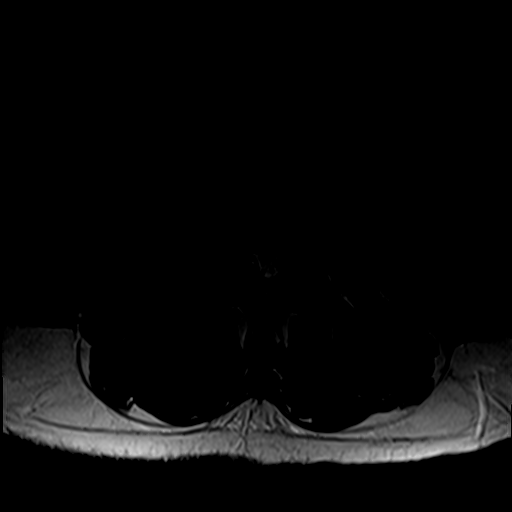
[im 22/49]
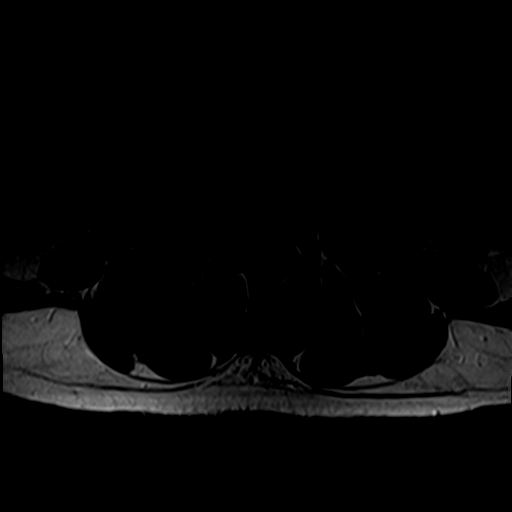
[im 27/49]
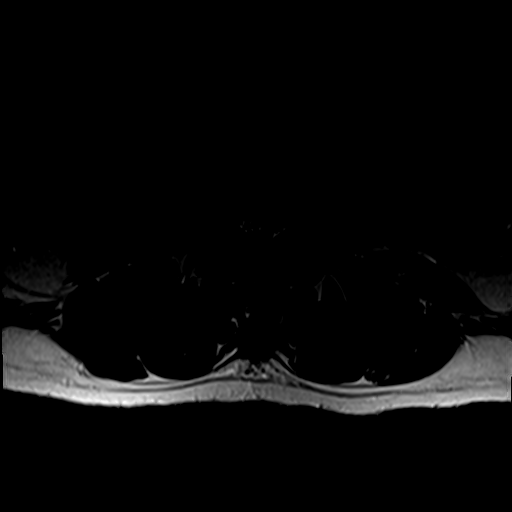
[im 35/49]
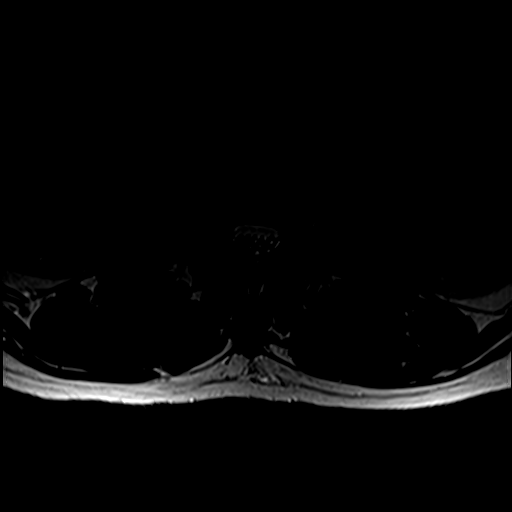
[im 40/49]
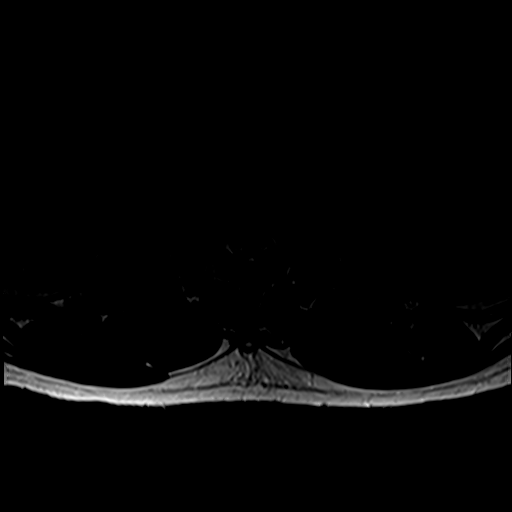
[im 44/49]
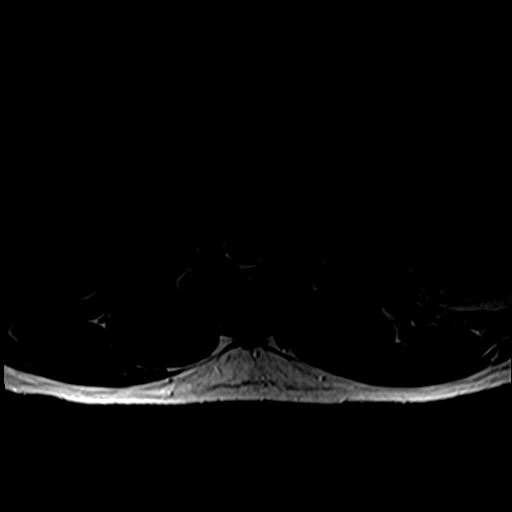
[im 49/49]
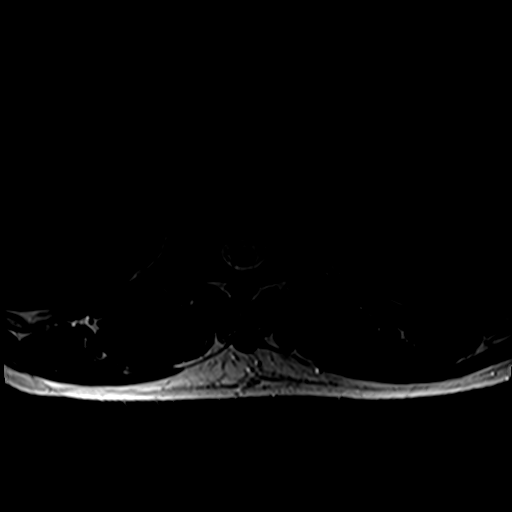

[Series 6: T1 · axial · 4.0mm · 0.39mm/px · z∈[-72,+157]mm · 7 of 49 slices shown (2 of 2)]
[im 1/49]
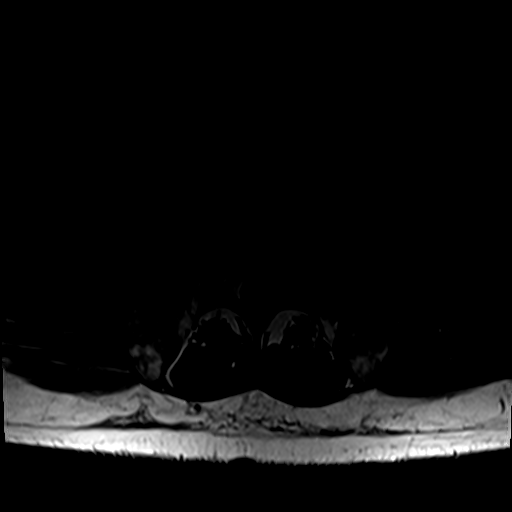
[im 9/49]
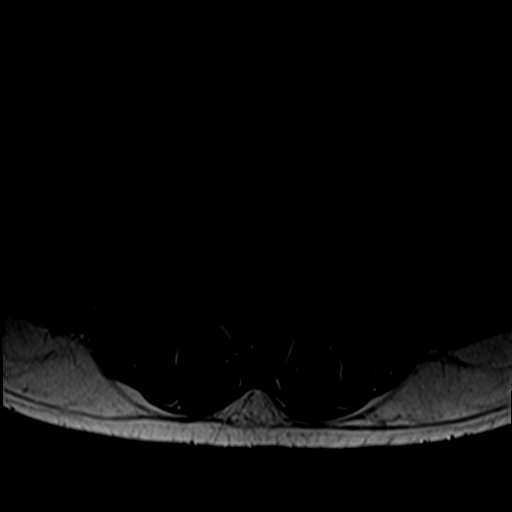
[im 14/49]
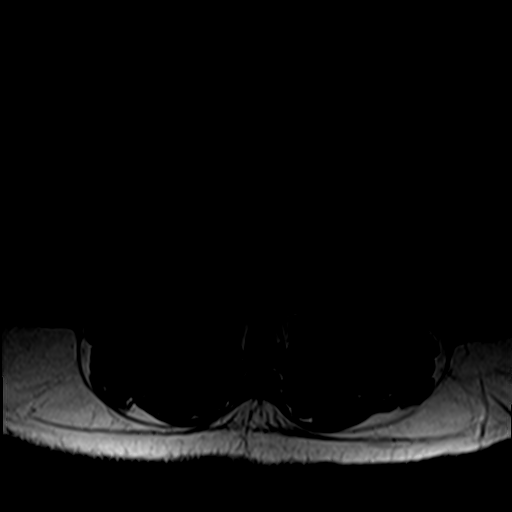
[im 22/49]
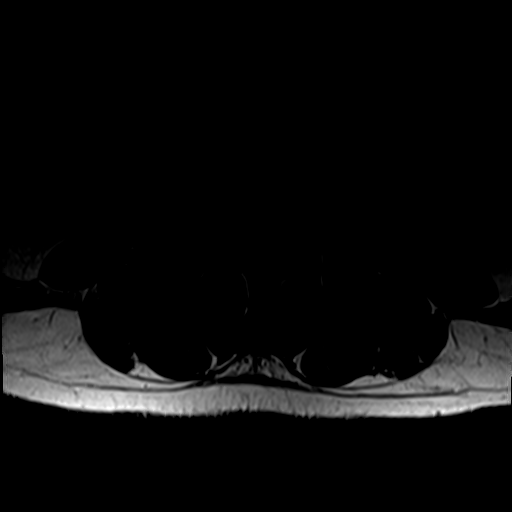
[im 27/49]
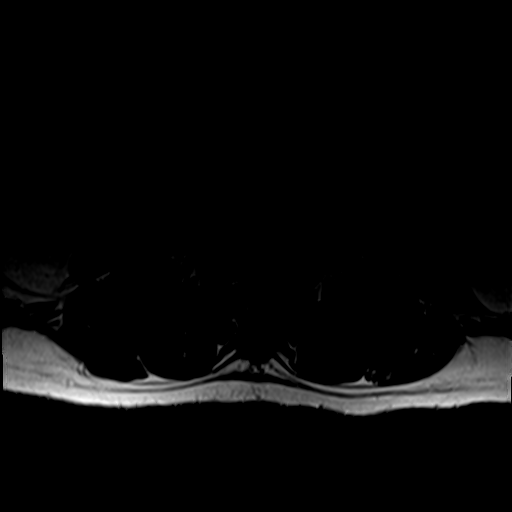
[im 35/49]
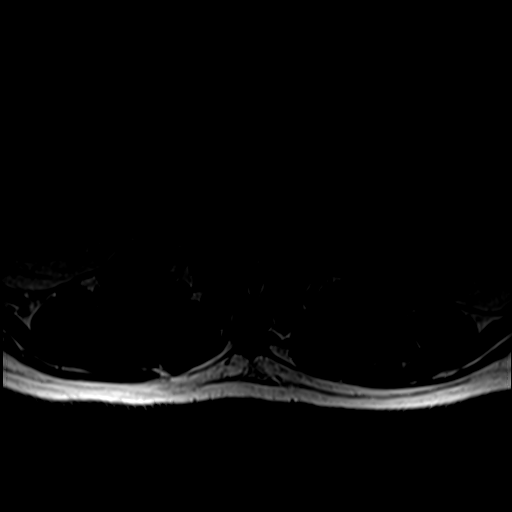
[im 44/49]
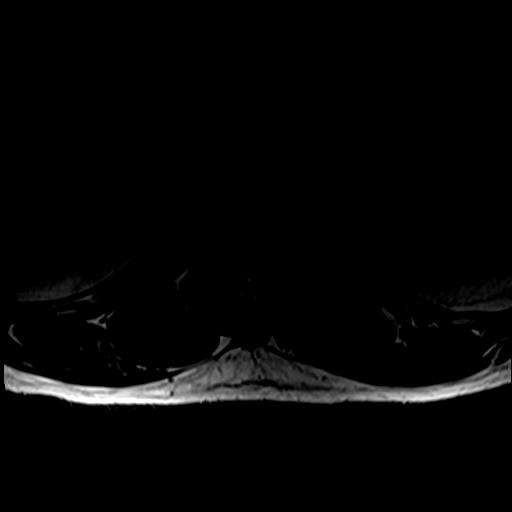

[Series 7: T2 · sagittal · 4.0mm · 1.09mm/px · 3 of 14 slices shown (2 of 2)]
[im 1/14]
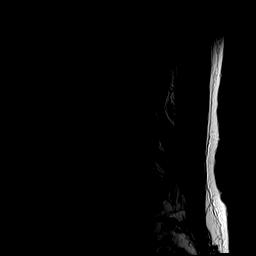
[im 7/14]
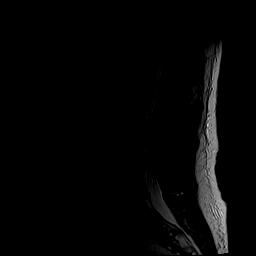
[im 14/14]
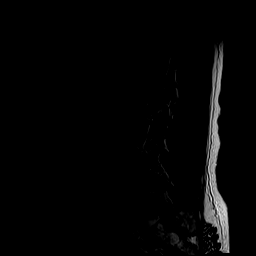

[22 of 48 positions shown; findings below may reference images not displayed]

FINDINGS: Segmentation:  Standard.

Alignment:  Physiologic.

Vertebrae: 11 mm rounded area of dark T1 and T2 signal in the L2
body, inconspicuous on STIR imaging and without detected enhancement
in distinction of prior abd MR, which was multi phasic. The
intensity is very similar to the disc space on T1 weighted imaging,
usually benign. No second lesion is seen.

Conus medullaris and cauda equina: Conus extends to the L1-2 level.
Conus and cauda equina appear normal.

Paraspinal and other soft tissues: Negative

Disc levels:

T12- L1: Unremarkable.

L1-L2: Unremarkable.

L2-L3: Unremarkable.

L3-L4: Disc narrowing and bulging. Facet and ligamentum flavum
hypertrophy accentuated by short pedicles. Moderate spinal stenosis
at this level.

L4-L5: Disc narrowing and bulging with left foraminal annular
fissure. Mild facet spurring and ligamentum flavum thickening. Mild
spinal stenosis.

L5-S1:Disc narrowing and bulging with shallow central protrusion.
Degenerative facet spurring on both sides. Left foraminal
impingement with effaced perineural fat. Left subarticular recess
narrowing that could affect the left S1 nerve root.
IMPRESSION: 1. 11 mm signal abnormality in the L2 body with nonspecific imaging
features that are usually benign . If history or suspected
malignancy, a follow-up study could be obtained for stability.
2. Lumbar spine degeneration as described.

## 2021-04-11 MED ORDER — GADOBENATE DIMEGLUMINE 529 MG/ML IV SOLN
17.0000 mL | Freq: Once | INTRAVENOUS | Status: AC | PRN
  Administered 2021-04-11: 17 mL via INTRAVENOUS

## 2021-04-29 ENCOUNTER — Ambulatory Visit (INDEPENDENT_AMBULATORY_CARE_PROVIDER_SITE_OTHER): Payer: 59

## 2021-04-29 DIAGNOSIS — I639 Cerebral infarction, unspecified: Secondary | ICD-10-CM | POA: Diagnosis not present

## 2021-04-29 LAB — CUP PACEART REMOTE DEVICE CHECK
Date Time Interrogation Session: 20221218000410
Implantable Pulse Generator Implant Date: 20210928

## 2021-04-30 NOTE — Progress Notes (Signed)
Electrophysiology Office Note Date: 05/01/2021  ID:  Jeffery Willis, DOB 1956/03/09, MRN [id number]  PCP: Tonia Ghent, MD Primary Cardiologist: None Electrophysiologist: Thompson Grayer, MD   CC: ILR follow-up  Jeffery Willis is a 65 y.o. male seen today for Dr. Rayann Heman . Jeffery Willis presents today for routine electrophysiology followup.  Since last being seen in our clinic, the patient reports doing very well.  Jeffery Willis denies chest pain, palpitations, dyspnea, PND, orthopnea, nausea, vomiting, dizziness, syncope, edema, weight gain, or early satiety.  Device History: MDT linq II implanted 02/07/20, cryptogenic stroke    Past Medical History:  Diagnosis Date   Achilles tendonitis 10/08/2017   Calculus of kidney 02/14/2010   Cerebrovascular accident 02/04/2020   L PCA stroke with hippocampal involvement   Generalized anxiety disorder    History of cardiovascular stress test 06/2005   normal myocardial perfusion. Given these results and the patient's persistence of flipped T waves, I think these are likely asymmptomatic and clinically insignificant findings   Mild vascular neurocognitive disorder 11/15/2020   Skin lesion    Past Surgical History:  Procedure Laterality Date   2 D Echo  06/2005   normal LV function and no LVH.  Normal 2D echowith ER60 to 70%   BUBBLE STUDY  02/07/2020   Procedure: BUBBLE STUDY;  Surgeon: Skeet Latch, MD;  Location: [place];  Service: Cardiovascular;;   KIDNEY STONE SURGERY     LOOP RECORDER INSERTION N/A 02/07/2020   Procedure: LOOP RECORDER INSERTION;  Surgeon: Thompson Grayer, MD;  Location: [place] [facility];  Service: Cardiovascular;  Laterality: N/A;   TEE WITHOUT CARDIOVERSION N/A 02/07/2020   Procedure: TRANSESOPHAGEAL ECHOCARDIOGRAM (TEE);  Surgeon: Skeet Latch, MD;  Location: [facility] ENDOSCOPY;  Service: Cardiovascular;  Laterality: N/A;    Current Outpatient Medications  Medication Sig Dispense Refill   atorvastatin (LIPITOR) 40 MG  tablet Take 40 mg by mouth daily.     clopidogrel (PLAVIX) 75 MG tablet Take 75 mg by mouth daily.     metoprolol succinate (TOPROL XL) 50 MG 24 hr tablet Take 1 tablet (50 mg total) by mouth daily. 90 tablet 3   Multiple Vitamin (MULTIVITAMIN) tablet Take 1 tablet by mouth daily.     No current facility-administered medications for this visit.    Allergies:   Patient has no known allergies.   Social History: Social History   Socioeconomic History   Marital status: Married    Spouse name: Not on file   Number of children: 2   Years of education: 16   Highest education level: Bachelor's degree (e.g., BA, AB, BS)  Occupational History   Occupation: Software engineer    Comment: works Scientist, research (medical) in [employer]  Tobacco Use   Smoking status: Never   Smokeless tobacco: Never  Vaping Use   Vaping Use: Never used  Substance and Sexual Activity   Alcohol use: No   Drug use: No   Sexual activity: Not on file  Other Topics Concern   Not on file  Social History Narrative   Lives with wife   Right Handed   Drinks 3-4 cups caffeine daily   Social Determinants of Health   Financial Resource Strain: Not on file  Food Insecurity: Not on file  Transportation Needs: Not on file  Physical Activity: Not on file  Stress: Not on file  Social Connections: Not on file  Intimate Partner Violence: Not on file    Family History: Family History  Problem Relation Age of Onset  Heart disease Mother        arrhythmia   Stroke Mother    Hypertension Father        mild   Arthritis Father    Memory loss Maternal Grandmother        possible undiagnosed Alzheimer's disease   Prostate cancer Paternal Grandfather    Colon cancer Neg Hx      Review of Systems: All other systems reviewed and are otherwise negative except as noted above.  Physical Exam: Vitals:   05/01/21 0908  BP: 128/84  Pulse: 67  SpO2: 99%  Weight: 200 lb (90.7 kg)  Height: 6\' 1"  (1.854 m)     GEN- The patient is well  appearing, alert and oriented x 3 today.   HEENT: normocephalic, atraumatic; sclera clear, conjunctiva pink; hearing intact; oropharynx clear; neck supple  Lungs- Clear to ausculation bilaterally, normal work of breathing.  No wheezes, rales, rhonchi Heart- Regular rate and rhythm, no murmurs, rubs or gallops  GI- soft, non-tender, non-distended, bowel sounds present  Extremities- no clubbing, cyanosis, or edema  MS- no significant deformity or atrophy Skin- warm and dry, no rash or lesion; PPM pocket well healed Psych- euthymic mood, full affect Neuro- strength and sensation are intact  PPM Interrogation- reviewed in detail today,  See PACEART report  EKG:  EKG  is ordered today. Person review shows NSR with 2 PVCs at 66 bpm  Recent Labs: 07/05/2020: Magnesium 2.1 12/17/2020: BUN 19; Creatinine, Ser 0.98; Hemoglobin 15.3; Platelets 170.0; Potassium 4.7; Sodium 139; TSH 1.58 03/18/2021: ALT 73   Wt Readings from Last 3 Encounters:  05/01/21 200 lb (90.7 kg)  03/18/21 195 lb (88.5 kg)  02/14/21 197 lb (89.4 kg)     Other studies Reviewed: Additional studies/ records that were reviewed today include:  Previous EP office notes, Previous remote checks, Most recent labwork.   Assessment and Plan:  1. Cryptogenic Stroke s/p [employer]onic Loop recorder Normal device function See Pace Art report No changes today  2. Wide complex tachycardia / PVCs Likely hemodynamically stable VT Normal Echo and myoview Normal EKG Continue to monitor clinically Continue Toprol  50 mg daily.  Consider updating Echo and or quantifying PVCs with monitor if increases   3. HTN Stable on current regimen   Current medicines are reviewed at length with the patient today.   The patient does not have concerns regarding his medicines.  The following changes were made today:  none  Disposition:   Follow up with EP APP in 6 Months, then to establish with new MD with Dr. 04/16/21 leaving.    Amedeo Plenty, PA-C  05/01/2021 9:20 AM  [facility] [address] [place] [phone] (office) [phone] (fax)

## 2021-05-01 ENCOUNTER — Encounter: Payer: Self-pay | Admitting: Student

## 2021-05-01 ENCOUNTER — Other Ambulatory Visit: Payer: Self-pay

## 2021-05-01 ENCOUNTER — Ambulatory Visit: Payer: 59 | Admitting: Student

## 2021-05-01 VITALS — BP 128/84 | HR 67 | Ht 73.0 in | Wt 200.0 lb

## 2021-05-01 DIAGNOSIS — R000 Tachycardia, unspecified: Secondary | ICD-10-CM | POA: Diagnosis not present

## 2021-05-01 DIAGNOSIS — I10 Essential (primary) hypertension: Secondary | ICD-10-CM

## 2021-05-01 DIAGNOSIS — I639 Cerebral infarction, unspecified: Secondary | ICD-10-CM

## 2021-05-01 NOTE — Patient Instructions (Signed)

## 2021-05-02 ENCOUNTER — Ambulatory Visit: Payer: 59 | Admitting: Student

## 2021-05-08 NOTE — Progress Notes (Signed)
Carelink Summary Report / Loop Recorder 

## 2021-06-03 ENCOUNTER — Ambulatory Visit (INDEPENDENT_AMBULATORY_CARE_PROVIDER_SITE_OTHER): Payer: 59

## 2021-06-03 DIAGNOSIS — I639 Cerebral infarction, unspecified: Secondary | ICD-10-CM | POA: Diagnosis not present

## 2021-06-03 LAB — CUP PACEART REMOTE DEVICE CHECK
Date Time Interrogation Session: 20230123001444
Implantable Pulse Generator Implant Date: 20210928

## 2021-06-14 NOTE — Progress Notes (Signed)
Carelink Summary Report / Loop Recorder 

## 2021-06-28 ENCOUNTER — Ambulatory Visit: Payer: 59 | Admitting: Family Medicine

## 2021-07-07 ENCOUNTER — Encounter: Payer: Self-pay | Admitting: Family Medicine

## 2021-07-07 LAB — CUP PACEART REMOTE DEVICE CHECK
Date Time Interrogation Session: 20230225000529
Implantable Pulse Generator Implant Date: 20210928

## 2021-07-08 ENCOUNTER — Ambulatory Visit (INDEPENDENT_AMBULATORY_CARE_PROVIDER_SITE_OTHER): Payer: 59

## 2021-07-08 DIAGNOSIS — I639 Cerebral infarction, unspecified: Secondary | ICD-10-CM | POA: Diagnosis not present

## 2021-07-12 NOTE — Progress Notes (Signed)
Carelink Summary Report / Loop Recorder 

## 2021-07-22 ENCOUNTER — Other Ambulatory Visit: Payer: Self-pay

## 2021-07-22 ENCOUNTER — Ambulatory Visit: Payer: 59 | Admitting: Family Medicine

## 2021-07-22 ENCOUNTER — Encounter: Payer: Self-pay | Admitting: Family Medicine

## 2021-07-22 VITALS — BP 116/70 | HR 59 | Temp 97.2°F | Ht 73.0 in | Wt 197.0 lb

## 2021-07-22 DIAGNOSIS — R7989 Other specified abnormal findings of blood chemistry: Secondary | ICD-10-CM

## 2021-07-22 DIAGNOSIS — R000 Tachycardia, unspecified: Secondary | ICD-10-CM

## 2021-07-22 LAB — HEPATIC FUNCTION PANEL
ALT: 43 U/L (ref 0–53)
AST: 32 U/L (ref 0–37)
Albumin: 4.3 g/dL (ref 3.5–5.2)
Alkaline Phosphatase: 58 U/L (ref 39–117)
Bilirubin, Direct: 0.4 mg/dL — ABNORMAL HIGH (ref 0.0–0.3)
Total Bilirubin: 1.5 mg/dL — ABNORMAL HIGH (ref 0.2–1.2)
Total Protein: 6.5 g/dL (ref 6.0–8.3)

## 2021-07-22 NOTE — Progress Notes (Unsigned)
This visit occurred during the SARS-CoV-2 public health emergency.  Safety protocols were in place, including screening questions prior to the visit, additional usage of staff PPE, and extensive cleaning of exam room while observing appropriate contact time as indicated for disinfecting solutions.  He has pulsating sensation on the R side of the neck but less noted than prev.    Prev bloating resolved.  D/w pt about prev LFT elevation.  No vomiting, no abd pain, no diarrhea.  Still on lipitor.    He saw cards in the meantime with no change in meds.    Prev neurocog testing:  Jeffery Willis is best conceptualized as having a mild vascular neurocognitive disorder due to his left PCA stroke at the present time.  Hypertension:    Using medication without problems or lightheadedness: yes Chest pain with exertion:no Edema:no Short of breath:no He called about an appointment after he had tachycardia after walking up stairs.  He has been able to go up the same stairs w/o troubles in the meantime.    07/06/21. ILR summary report received. Battery status OK. Normal device function. No new symptom, tachy, brady, or pause episodes. No new AF episodes. Monthly summary reports and ROV/PRN   Meds, vitals, and allergies reviewed.   ROS: Per HPI unless specifically indicated in ROS section   GEN: nad, alert and oriented HEENT: mucous membranes moist NECK: supple w/o LA CV: rrr with occ ectopy  no murmur PULM: ctab, no inc wob ABD: soft, +bs EXT: no edema SKIN: well perfused.  No jaundice.

## 2021-07-22 NOTE — Patient Instructions (Signed)
If you have more symptoms then update cardiology.   ?Go to the lab on the way out.   If you have mychart we'll likely use that to update you.    ?Take care.  Glad to see you. ?

## 2021-07-24 NOTE — Assessment & Plan Note (Signed)
History of.  Now with implanted loop recorder.  Previous summary discussed with patient.  He has occasional ectopy but as long as he is not having more symptoms I would not change his medication.  Continue metoprolol for now.  If he has more trouble and I asked him to update cardiology. ?

## 2021-07-24 NOTE — Assessment & Plan Note (Signed)
Still on statin.  Recheck LFTs.  See notes on labs. ?

## 2021-08-12 ENCOUNTER — Ambulatory Visit (INDEPENDENT_AMBULATORY_CARE_PROVIDER_SITE_OTHER): Payer: 59

## 2021-08-12 DIAGNOSIS — I639 Cerebral infarction, unspecified: Secondary | ICD-10-CM

## 2021-08-13 LAB — CUP PACEART REMOTE DEVICE CHECK
Date Time Interrogation Session: 20230404083554
Implantable Pulse Generator Implant Date: 20210928

## 2021-08-23 NOTE — Progress Notes (Signed)
Carelink Summary Report / Loop Recorder 

## 2021-09-12 LAB — CUP PACEART REMOTE DEVICE CHECK
Date Time Interrogation Session: 20230504001235
Implantable Pulse Generator Implant Date: 20210928

## 2021-09-16 ENCOUNTER — Ambulatory Visit (INDEPENDENT_AMBULATORY_CARE_PROVIDER_SITE_OTHER): Payer: 59

## 2021-09-16 DIAGNOSIS — I639 Cerebral infarction, unspecified: Secondary | ICD-10-CM

## 2021-10-03 NOTE — Progress Notes (Signed)
Carelink Summary Report / Loop Recorder 

## 2021-10-21 ENCOUNTER — Ambulatory Visit (INDEPENDENT_AMBULATORY_CARE_PROVIDER_SITE_OTHER): Payer: 59

## 2021-10-21 DIAGNOSIS — I639 Cerebral infarction, unspecified: Secondary | ICD-10-CM

## 2021-10-23 LAB — CUP PACEART REMOTE DEVICE CHECK
Date Time Interrogation Session: 20230613123331
Implantable Pulse Generator Implant Date: 20210928

## 2021-10-29 ENCOUNTER — Ambulatory Visit: Payer: 59 | Admitting: Family Medicine

## 2021-10-29 ENCOUNTER — Encounter: Payer: Self-pay | Admitting: Family Medicine

## 2021-10-29 ENCOUNTER — Encounter: Payer: Self-pay | Admitting: *Deleted

## 2021-10-29 VITALS — BP 130/88 | HR 59 | Temp 97.3°F | Ht 73.0 in | Wt 199.2 lb

## 2021-10-29 DIAGNOSIS — R000 Tachycardia, unspecified: Secondary | ICD-10-CM | POA: Diagnosis not present

## 2021-10-29 LAB — COMPREHENSIVE METABOLIC PANEL
ALT: 38 U/L (ref 0–53)
AST: 32 U/L (ref 0–37)
Albumin: 4.1 g/dL (ref 3.5–5.2)
Alkaline Phosphatase: 68 U/L (ref 39–117)
BUN: 16 mg/dL (ref 6–23)
CO2: 30 mEq/L (ref 19–32)
Calcium: 9.2 mg/dL (ref 8.4–10.5)
Chloride: 105 mEq/L (ref 96–112)
Creatinine, Ser: 0.95 mg/dL (ref 0.40–1.50)
GFR: 84.07 mL/min (ref >60.00)
Glucose, Bld: 95 mg/dL (ref 70–99)
Potassium: 5.1 mEq/L (ref 3.5–5.1)
Sodium: 141 mEq/L (ref 135–145)
Total Bilirubin: 1.3 mg/dL — ABNORMAL HIGH (ref 0.2–1.2)
Total Protein: 6.5 g/dL (ref 6.0–8.3)

## 2021-10-29 LAB — CBC WITH DIFFERENTIAL/PLATELET
Basophils Absolute: 0 10*3/uL (ref 0.0–0.1)
Basophils Relative: 0.7 % (ref 0.0–3.0)
Eosinophils Absolute: 0.1 10*3/uL (ref 0.0–0.7)
Eosinophils Relative: 1.7 % (ref 0.0–5.0)
HCT: 46.5 % (ref 39.0–52.0)
Hemoglobin: 15.5 g/dL (ref 13.0–17.0)
Lymphocytes Relative: 14.1 % (ref 12.0–46.0)
Lymphs Abs: 1 10*3/uL (ref 0.7–4.0)
MCHC: 33.3 g/dL (ref 30.0–36.0)
MCV: 92.9 fl (ref 78.0–100.0)
Monocytes Absolute: 0.8 10*3/uL (ref 0.1–1.0)
Monocytes Relative: 11.2 % (ref 3.0–12.0)
Neutro Abs: 5.1 10*3/uL (ref 1.4–7.7)
Neutrophils Relative %: 72.3 % (ref 43.0–77.0)
Platelets: 129 10*3/uL — ABNORMAL LOW (ref 150.0–400.0)
RBC: 5.01 Mil/uL (ref 4.22–5.81)
RDW: 13.8 % (ref 11.5–15.5)
WBC: 7 10*3/uL (ref 4.0–10.5)

## 2021-10-29 LAB — TSH: TSH: 1.29 u[IU]/mL (ref 0.35–5.50)

## 2021-10-29 NOTE — Progress Notes (Unsigned)
Follow up re:BP and linq monitor.  He can walk up stairs sometimes w/o any sx but sometimes has exertional sx, with tachycardia noted.  Still on metoprolol at baseline.  No pain with exertion but "it feels like my heart is beating hard" some of the time and he feels his heart rate is episodically elevated.  No syncope.  No near syncope.  When it happens it can last a few minutes.  Can happen at rest.  He feels likely he needs to slow down when an episode happens.  No wheeze.  Can still take a deep breath.  Last episode with sx was a few weeks ago, around 10/11/21.    RRR with occ ectopy.     Need to update EP/cards.

## 2021-10-29 NOTE — Patient Instructions (Addendum)
Don't change your meds for now.  Go to the lab on the way out.   If you have mychart we'll likely use that to update you.    Let me update cardiology.  They may want to recheck your echo and/or see you in clinic.  Take care.  Glad to see you.

## 2021-10-30 NOTE — Assessment & Plan Note (Signed)
Discussed with patient about options.  Reasonable to check routine labs today and then I will update cardiology.  It may be a reasonable option to update his echo versus reinterrogate his device.  I need cardiology input about that.  I appreciate cardiology input.  See notes on labs.  Reasonable to continue metoprolol as is for now.

## 2021-11-05 ENCOUNTER — Ambulatory Visit: Payer: 59 | Admitting: Physician Assistant

## 2021-11-05 ENCOUNTER — Encounter: Payer: Self-pay | Admitting: Physician Assistant

## 2021-11-05 VITALS — BP 104/62 | HR 64 | Ht 73.0 in | Wt 199.8 lb

## 2021-11-05 DIAGNOSIS — Z4509 Encounter for adjustment and management of other cardiac device: Secondary | ICD-10-CM

## 2021-11-05 DIAGNOSIS — R002 Palpitations: Secondary | ICD-10-CM

## 2021-11-05 DIAGNOSIS — I472 Ventricular tachycardia, unspecified: Secondary | ICD-10-CM

## 2021-11-05 DIAGNOSIS — I493 Ventricular premature depolarization: Secondary | ICD-10-CM

## 2021-11-05 LAB — CUP PACEART INCLINIC DEVICE CHECK
Date Time Interrogation Session: 20230627100414
Implantable Pulse Generator Implant Date: 20210928

## 2021-11-11 NOTE — Progress Notes (Signed)
Carelink Summary Report / Loop Recorder 

## 2021-11-21 LAB — CUP PACEART REMOTE DEVICE CHECK
Date Time Interrogation Session: 20230709000421
Implantable Pulse Generator Implant Date: 20210928

## 2021-11-25 ENCOUNTER — Ambulatory Visit (INDEPENDENT_AMBULATORY_CARE_PROVIDER_SITE_OTHER): Payer: 59

## 2021-11-25 DIAGNOSIS — I639 Cerebral infarction, unspecified: Secondary | ICD-10-CM

## 2021-12-27 NOTE — Progress Notes (Signed)
Carelink Summary Report / Loop Recorder 

## 2021-12-28 NOTE — Progress Notes (Unsigned)
Cardiology Office Note Date:  12/28/2021  Patient ID:  Jeffery Willis, Jeffery Willis [birth year], MRN [id number] PCP:  Joaquim Nam, MD  Electrophysiologist: Dr. Johney Frame    Chief Complaint:  6 mo  History of Present Illness: Jeffery Willis is a 66 y.o. male with history of anxiety, cryptogenic stroke prompting loop implant Sept 2021.  Stress test was normal, LVEF preserved  He comes in today to be seen for Dr. Johney Frame, last seen by him during his haopsital stay for stroke > loop implanted.  Device remote dated 05/30/20 noted WCT felt c/w VT and scheduled to come in for further evaluation.  I saw him Feb 2022 He feels quite well. No CP, no SOB or DOE. Infrequently has an awareness of his heart beat feels strong, maybe fast, but bnot racing, ore of a feeling like is beating hard/strong, no pain Exercises somewhat intermittently when he can. Will jog/run on the treadmill. The day of his VT episode on loop, when RN here called him , timing lines up with day he was at the gym, had run particularly hard on the treadmill and felt well doing it, but once done for a couple minutes felt poorly, weak.  Did not feel faint, no near syncope or syncope, and then just felt better. He denies any hx of near syncope or syncope ever No family hx of SCD His Mom has Afib D/w DOD, planned for stress test, labs, recommended that he not push to that exercise level, target HRs towards the 120's  He saw Dr. Johney Frame 08/08/21, discussed VT was without hemodynamic compromise, TTE with preserved EF. Aslo noted that he had an episode of disorganized atrial activity 07/26/20, planned to continue to monitor No changes were made.  He saw A. Tillery, PA-C 02/05/21, noted PVCs, some in a trigeminal pattern, his Toprol was increased.  I saw him 11/05/21  He is doing well Goes to the [facility] fairly regularly, mostly weights, some treadmill. Good exertional capacity No CP  About 3 weeks ago after walking up a flight of stairs  felt his heart take off racing, was uncomfortable, anxiety provoking, no near syncope or syncope. No CP, but the racing was uncomfortable, and pounding hard This has happened before but very infrequently Also infrequently on the treadmill he will heel when getting to peak exercise perhaps that his heart beat is strong Largely unaware of any skipped beats or palpitations otherwise Loop noted no AFib or arrhythmias though observed PVCs intermittently and PVC counter programmed on and he was given a symptom activator with plans to see him in a few months  *** appears PVC counter has since been programmed OFF ?? *** symptom episodes? *** symptoms *** AF?  Device information MDT linq II implanted 02/07/20, cryptogenic stroke   Past Medical History:  Diagnosis Date   Achilles tendonitis 10/08/2017   Calculus of kidney 02/14/2010   Cerebrovascular accident 02/04/2020   L PCA stroke with hippocampal involvement   Generalized anxiety disorder    History of cardiovascular stress test 06/2005   normal myocardial perfusion. Given these results and the patient's persistence of flipped T waves, I think these are likely asymmptomatic and clinically insignificant findings   Mild vascular neurocognitive disorder 11/15/2020   Skin lesion     Past Surgical History:  Procedure Laterality Date   2 D Echo  06/2005   normal LV function and no LVH.  Normal 2D echowith ER60 to 70%   BUBBLE STUDY  02/07/2020   Procedure: BUBBLE STUDY;  Surgeon: [name], MD;  Location: [facility];  Service: Cardiovascular;;   KIDNEY STONE SURGERY     LOOP RECORDER INSERTION N/A 02/07/2020   Procedure: LOOP RECORDER INSERTION;  Surgeon: Hillis Range, MD;  Location: [facility];  Service: Cardiovascular;  Laterality: N/A;   TEE WITHOUT CARDIOVERSION N/A 02/07/2020   Procedure: TRANSESOPHAGEAL ECHOCARDIOGRAM (TEE);  Surgeon: [name], MD;  Location: [facility] ENDOSCOPY;  Service: Cardiovascular;  Laterality:  N/A;    Current Outpatient Medications  Medication Sig Dispense Refill   atorvastatin (LIPITOR) 40 MG tablet Take 40 mg by mouth daily.     clopidogrel (PLAVIX) 75 MG tablet Take 75 mg by mouth daily.     metoprolol succinate (TOPROL XL) 50 MG 24 hr tablet Take 1 tablet (50 mg total) by mouth daily. 90 tablet 3   Multiple Vitamin (MULTIVITAMIN) tablet Take 1 tablet by mouth daily.     No current facility-administered medications for this visit.    Allergies:   Patient has no known allergies.   Social History:  The patient  reports that he has never smoked. He has never used smokeless tobacco. He reports that he does not drink alcohol and does not use drugs.   Family History:  The patient's family history includes Arthritis in his father; Heart disease in his mother; Hypertension in his father; Memory loss in his maternal grandmother; Prostate cancer in his paternal grandfather; Stroke in his mother.  ROS:  Please see the history of present illness.    All other systems are reviewed and otherwise negative.   PHYSICAL EXAM:  VS:  There were no vitals taken for this visit. BMI: There is no height or weight on file to calculate BMI. Well nourished, well developed, in no acute distress HEENT: normocephalic, atraumatic Neck: no JVD, carotid bruits or masses Cardiac:  *** RRR; no significant murmurs, no rubs, or gallops Lungs:  *** CTA b/l, no wheezing, rhonchi or rales Abd: soft, nontender MS: no deformity or atrophy Ext:  ***no edema Skin: warm and dry, no rash Neuro:  No gross deficits appreciated Psych: euthymic mood, full affect  *** ILR site is stable, no tethering or discomfort   EKG:  not done today  Device interrogation done today and reviewed by myself:  ***  08/06/2020: stress myoview Nuclear stress EF: 55%. The left ventricular ejection fraction is normal (55-65%). There was no ST segment deviation noted during stress. No T wave inversion was noted during  stress. The study is normal. This is a low risk study.   1. Normal myocardial perfusion imaging study without evidence of ischemia or infarction. 2. Normal LVEF, 55%. 3. Average functional capacity (7:30 min:s; 9.2 METS).  4. Normal heart rate response to exercise. 5. Hypertensive response to exercise, peak BP 234/83. 6. This is a low-risk study.   02/07/20; TEE IMPRESSIONS   1. Left ventricular ejection fraction, by estimation, is 60 to 65%. The  left ventricle has normal function. The left ventricle has no regional  wall motion abnormalities.   2. Right ventricular systolic function is normal. The right ventricular  size is normal.   3. No left atrial/left atrial appendage thrombus was detected.   4. The mitral valve is normal in structure. Trivial mitral valve  regurgitation. No evidence of mitral stenosis.   5. The aortic valve is tricuspid. Aortic valve regurgitation is not  visualized. No aortic stenosis is present.   6. Agitated saline contrast bubble study was negative, with no evidence  of  any interatrial shunt.   Conclusion(s)/Recommendation(s): Normal biventricular function without  evidence of hemodynamically significant valvular heart disease.     02/06/20: TTE IMPRESSIONS   1. Left ventricular ejection fraction, by estimation, is 65 to 70%. The  left ventricle has normal function. The left ventricle has no regional  wall motion abnormalities. There is mild concentric left ventricular  hypertrophy. Left ventricular diastolic  parameters were normal.   2. Right ventricular systolic function is normal. The right ventricular  size is normal.   3. The mitral valve is normal in structure. No evidence of mitral valve  regurgitation. No evidence of mitral stenosis.   4. The aortic valve is normal in structure. Aortic valve regurgitation is  not visualized. No aortic stenosis is present.   5. The inferior vena cava is normal in size with greater than 50%  respiratory  variability, suggesting right atrial pressure of 3 mmHg.   Comparison(s): No prior Echocardiogram.    Recent Labs: 10/29/2021: ALT 38; BUN 16; Creatinine, Ser 0.95; Hemoglobin 15.5; Platelets 129.0; Potassium 5.1; Sodium 141; TSH 1.29  No results found for requested labs within last 365 days.   CrCl cannot be calculated (Patient's most recent lab result is older than the maximum 21 days allowed.).   Wt Readings from Last 3 Encounters:  11/05/21 199 lb 12.8 oz (90.6 kg)  10/29/21 199 lb 4 oz (90.4 kg)  07/22/21 197 lb (89.4 kg)     Other studies reviewed: Additional studies/records reviewed today include: summarized above  ASSESSMENT AND PLAN:  1. Cryptogenic stroke     ILR     *** No Afib to date  2. VT     Feb 2022, hemodynamically stable     Normal stress test     Preserved LVEF 3.  PVCs Symptoms as discussed above No episodes noted by the device ***       Disposition: ***   Current medicines are reviewed at length with the patient today.  The patient did not have any concerns regarding medicines.  Norma Fredrickson, PA-C 12/28/2021 9:32 AM     [facility] [address] [place] [address] [phone] (office)  [phone] (fax)

## 2021-12-30 ENCOUNTER — Ambulatory Visit (INDEPENDENT_AMBULATORY_CARE_PROVIDER_SITE_OTHER): Payer: 59

## 2021-12-30 ENCOUNTER — Encounter: Payer: Self-pay | Admitting: Physician Assistant

## 2021-12-30 ENCOUNTER — Ambulatory Visit: Payer: 59 | Admitting: Physician Assistant

## 2021-12-30 VITALS — BP 138/82 | HR 68 | Ht 73.0 in | Wt 198.6 lb

## 2021-12-30 DIAGNOSIS — Z79899 Other long term (current) drug therapy: Secondary | ICD-10-CM | POA: Diagnosis not present

## 2021-12-30 DIAGNOSIS — Z4509 Encounter for adjustment and management of other cardiac device: Secondary | ICD-10-CM | POA: Diagnosis not present

## 2021-12-30 DIAGNOSIS — I472 Ventricular tachycardia, unspecified: Secondary | ICD-10-CM

## 2021-12-30 DIAGNOSIS — I493 Ventricular premature depolarization: Secondary | ICD-10-CM

## 2021-12-30 DIAGNOSIS — I639 Cerebral infarction, unspecified: Secondary | ICD-10-CM

## 2021-12-30 LAB — CUP PACEART INCLINIC DEVICE CHECK
Date Time Interrogation Session: 20230821130737
Implantable Pulse Generator Implant Date: 20210928

## 2021-12-30 LAB — BASIC METABOLIC PANEL
BUN/Creatinine Ratio: 19 (ref 10–24)
BUN: 19 mg/dL (ref 8–27)
CO2: 23 mmol/L (ref 20–29)
Calcium: 9.8 mg/dL (ref 8.6–10.2)
Chloride: 104 mmol/L (ref 96–106)
Creatinine, Ser: 1.02 mg/dL (ref 0.76–1.27)
Glucose: 94 mg/dL (ref 70–99)
Potassium: 4.1 mmol/L (ref 3.5–5.2)
Sodium: 140 mmol/L (ref 134–144)
eGFR: 82 mL/min/{1.73_m2} (ref >59)

## 2021-12-30 NOTE — Patient Instructions (Signed)
Medication Instructions:   Your physician recommends that you continue on your current medications as directed. Please refer to the Current Medication list given to you today.  *If you need a refill on your cardiac medications before your next appointment, please call your pharmacy*   Lab Work:  BMET TODAY    If you have labs (blood work) drawn today and your tests are completely normal, you will receive your results only by: MyChart Message (if you have MyChart) OR A paper copy in the mail If you have any lab test that is abnormal or we need to change your treatment, we will call you to review the results.   Testing/Procedures: Your physician has requested that you have an echocardiogram. Echocardiography is a painless test that uses sound waves to create images of your heart. It provides your doctor with information about the size and shape of your heart and how well your heart's chambers and valves are working. This procedure takes approximately one hour. There are no restrictions for this procedure.   Follow-Up: At [facility], you and your health needs are our priority.  As part of our continuing mission to provide you with exceptional heart care, we have created designated Provider Care Teams.  These Care Teams include your primary Cardiologist (physician) and Advanced Practice Providers (APPs -  Physician Assistants and Nurse Practitioners) who all work together to provide you with the care you need, when you need it.  We recommend signing up for the patient portal called "MyChart".  Sign up information is provided on this After Visit Summary.  MyChart is used to connect with patients for Virtual Visits (Telemedicine).  Patients are able to view lab/test results, encounter notes, upcoming appointments, etc.  Non-urgent messages can be sent to your provider as well.   To learn more about what you can do with MyChart, go to [email or web address].    Your next appointment:   6  month(s)  The format for your next appointment:   In Person  Provider:   Steffanie Dunn, MD    Other Instructions   Important Information About Sugar

## 2021-12-31 LAB — CUP PACEART REMOTE DEVICE CHECK
Date Time Interrogation Session: 20230821001039
Implantable Pulse Generator Implant Date: 20210928

## 2022-01-26 NOTE — Progress Notes (Signed)
Carelink Summary Report / Loop Recorder 

## 2022-01-27 ENCOUNTER — Ambulatory Visit (HOSPITAL_COMMUNITY): Payer: 59 | Attending: Physician Assistant

## 2022-01-27 DIAGNOSIS — I493 Ventricular premature depolarization: Secondary | ICD-10-CM | POA: Insufficient documentation

## 2022-01-27 DIAGNOSIS — Z79899 Other long term (current) drug therapy: Secondary | ICD-10-CM | POA: Insufficient documentation

## 2022-01-27 LAB — ECHOCARDIOGRAM COMPLETE
Area-P 1/2: 2.91 cm2
S' Lateral: 2.8 cm

## 2022-02-03 ENCOUNTER — Ambulatory Visit (INDEPENDENT_AMBULATORY_CARE_PROVIDER_SITE_OTHER): Payer: 59

## 2022-02-03 DIAGNOSIS — I639 Cerebral infarction, unspecified: Secondary | ICD-10-CM | POA: Diagnosis not present

## 2022-02-03 LAB — CUP PACEART REMOTE DEVICE CHECK
Date Time Interrogation Session: 20230923000508
Implantable Pulse Generator Implant Date: 20210928

## 2022-02-14 NOTE — Progress Notes (Signed)
Carelink Summary Report / Loop Recorder 

## 2022-02-24 ENCOUNTER — Ambulatory Visit: Payer: 59 | Admitting: Neurology

## 2022-02-24 ENCOUNTER — Encounter: Payer: Self-pay | Admitting: Neurology

## 2022-02-24 VITALS — BP 148/79 | HR 61 | Ht 73.0 in | Wt 196.0 lb

## 2022-02-24 DIAGNOSIS — Z8673 Personal history of transient ischemic attack (TIA), and cerebral infarction without residual deficits: Secondary | ICD-10-CM

## 2022-02-24 DIAGNOSIS — G3184 Mild cognitive impairment, so stated: Secondary | ICD-10-CM

## 2022-02-24 NOTE — Progress Notes (Signed)
[facility] [address] [place].  [address] [phone]       OFFICE FOLLOW UP VISIT NOTE  [name] Date of Birth:  06-25-1955 Medical Record Number:  [id number]   Referring MD: Crawford Givens  Reason for Referral: Memory loss  HPI: Initial visit 10/23/2020: [name] is a pleasant 36 Caucasian male seen today for office consultation visit for memory loss.  History is obtained from the patient and review of electronic medical records and personally reviewed available imaging films in PACS.  He has no significant past medical history except left PCA branch infarct of cryptogenic etiology in September 2021 with practically no significant residual deficits except right superior quadrantic vision loss.  He has noticed subjective memory difficulties for the last few months.  He states he has to at times think where he is going and has to concentrate harder to remember things.  He forgets generic names of medications which he has been prescribing for years as he works as a Teacher, early years/pre.  However if he thinks are enough it will come back.  Is still able to work and works to 3 days a week.  He is managing all his affairs independently however his wife is more concerned about his forgetfulness and short-term memory difficulties.  He denies any headaches, loss of consciousness, seizure, significant head injury.  He does have history of left medial temporal infarct in September 2021 of cryptogenic etiology.  He had a loop recorder placed and so far A. fib has not been found.  He had mild residual right upper quadrant vision loss which still persists.  He has some short-term memory difficulties following the stroke but had shown improvement in this recent worsening symptoms new.  He has had no recent brain imaging study done.  He has a referral to neuropsychologist and has an appointment next month.  He has no family history of Alzheimer's or dementia.  His dad had Parkinson's  disease. Update 02/14/2021 : He returns for follow-up after last visit 3 months ago.  He states he is doing well he feels subjectively is short-term memory is actually better though his wife does not think so.  He is tolerating Plavix well without bruising or bleeding.  His blood pressures well controlled today it is 130/73.  He remains on Lipitor which is tolerating well without side effects however he has had slightly elevated liver enzymes recently and his primary care physician has ordered an MRI scan of the liver which is pending.  Patient has a loop recorder in on last evaluation on 02/11/2021 no evidence of paroxysmal A. fib was found.  At the last visit he had lab work for reversible causes of memory loss and vitamin B12, TSH, RPR and homocystine were all normal.  EEG was ordered but for some reason has not been done.  MRI scan of the brain on 11/08/2020 shows old left deep temporal infarct and no acute abnormality.  He has no new complaints.  Update 02/24/2022 ; he returns for follow-up after last visit on 02/14/2021.  He states he is doing well.  His memory is little bit better.  He plays a lot of chess but does not do any other cognitively challenging activities regular basis.  He does exercise at the [facility].3 days per week.  He also walks 2 miles.-No recurrent stroke or TIA symptoms.  Remains on Plavix tolerating well without bruising or bleeding.  He states his blood pressure is under good control and today it  is slightly elevated 148/79.  He is tolerating Lipitor 40 mg daily well without muscle aches or pains.  He states his lipid profile was recently checked a few months ago and was satisfactory.  He has no complaints today.  ROS:   14 system review of systems is positive for memory loss, word finding difficulties only all other systems negative forgetfulness,  PMH:  Past Medical History:  Diagnosis Date   Achilles tendonitis 10/08/2017   Calculus of kidney 02/14/2010   Cerebrovascular accident  02/04/2020   L PCA stroke with hippocampal involvement   Generalized anxiety disorder    History of cardiovascular stress test 06/2005   normal myocardial perfusion. Given these results and the patient's persistence of flipped T waves, I think these are likely asymmptomatic and clinically insignificant findings   Mild vascular neurocognitive disorder 11/15/2020   Skin lesion     Social History:  Social History   Socioeconomic History   Marital status: Married    Spouse name: Not on file   Number of children: 2   Years of education: 16   Highest education level: Bachelor's degree (e.g., BA, AB, BS)  Occupational History   Occupation: Software engineer    Comment: works Scientist, research (medical) in [employer]  Tobacco Use   Smoking status: Never   Smokeless tobacco: Never  Vaping Use   Vaping Use: Never used  Substance and Sexual Activity   Alcohol use: No   Drug use: No   Sexual activity: Not on file  Other Topics Concern   Not on file  Social History Narrative   Lives with wife   Right Handed   Drinks 3-4 cups caffeine daily   Social Determinants of Health   Financial Resource Strain: Not on file  Food Insecurity: Not on file  Transportation Needs: Not on file  Physical Activity: Not on file  Stress: Not on file  Social Connections: Not on file  Intimate Partner Violence: Not on file    Medications:   Current Outpatient Medications on File Prior to Visit  Medication Sig Dispense Refill   atorvastatin (LIPITOR) 40 MG tablet Take 40 mg by mouth daily.     clopidogrel (PLAVIX) 75 MG tablet Take 75 mg by mouth daily.     metoprolol succinate (TOPROL XL) 50 MG 24 hr tablet Take 1 tablet (50 mg total) by mouth daily. 90 tablet 3   Multiple Vitamin (MULTIVITAMIN) tablet Take 1 tablet by mouth daily.     No current facility-administered medications on file prior to visit.    Allergies:  No Known Allergies  Physical Exam General: well developed, well nourished, pleasant middle-age Caucasian  male seated, in no evident distress Head: head normocephalic and atraumatic.   Neck: supple with no carotid or supraclavicular bruits Cardiovascular: regular rate and rhythm, no murmurs Musculoskeletal: no deformity Skin:  no rash/petichiae Vascular:  Normal pulses all extremities  Neurologic Exam Mental Status: Awake and fully alert. Oriented to place and time. Recent and remote memory intact. Attention span, concentration and fund of knowledge appropriate. Mood and affect appropriate.  MMSE not done today.  Recall 3/3.Marland Kitchen  He was able to name 16 animals which can walk on 4 legs.  Clock drawing score was 4/4.  On geriatric depression scale score 2 which is not suggestive of depression. Cranial Nerves: Fundoscopic exam reveals sharp disc margins. Pupils equal, briskly reactive to light. Extraocular movements full without nystagmus. Visual fields show partial right superior quadrantanopsia to confrontation. Hearing intact. Facial sensation intact. Face, tongue,  palate moves normally and symmetrically.  Motor: Normal bulk and tone. Normal strength in all tested extremity muscles. Sensory.: intact to touch , pinprick , position and vibratory sensation.  Coordination: Rapid alternating movements normal in all extremities. Finger-to-nose and heel-to-shin performed accurately bilaterally. Gait and Station: Arises from chair without difficulty. Stance is normal. Gait demonstrates normal stride length and balance . Able to heel, toe and tandem walk without difficulty.  Reflexes: 1+ and symmetric. Toes downgoing.   NIHSS  1 Modified Rankin  1      No data to display           ASSESSMENT: 66 year old Caucasian male with subacute short-term memory and mild cognitive difficulties likely from mild cognitive impairment.  Remote history of left medial temporal infarct in September 2021 of cryptogenic etiology.  Vascular risk factors of hyperlipidemia hypertension and prior stroke.  He appears stable from  neurovascular and cognitive standpoint.   PLANI I had a long d/w patient about his remote stroke and mild cognitive impairment, risk for recurrent stroke/TIAs, personally independently reviewed imaging studies and stroke evaluation results and answered questions.Continue Plavix (clopidogrel 75 mg daily  for secondary stroke prevention and maintain strict control of hypertension with blood pressure goal below 130/90, diabetes with hemoglobin A1c goal below 6.5% and lipids with LDL cholesterol goal below 70 mg/dL. I also advised the patient to eat a healthy diet with plenty of whole grains, cereals, fruits and vegetables, exercise regularly and maintain ideal body weight .I encouraged him to continue participating in cognitively challenging activities like playing bridge, sudoku, Solving Crossword Puzzles.  Check Screening Follow-Up Carotid Ultrasound Study.  Followup in the future with me in 1 year or call earlier if necessary.Greater than 50% time during this 35-minute consultation visit was spent in counseling and coordination of care about his memory loss and mild cognitive impairment and remote stroke and answering questions.  Delia Heady, MD Note: This document was prepared with digital dictation and possible smart phrase technology. Any transcriptional errors that result from this process are unintentional.

## 2022-02-24 NOTE — Patient Instructions (Signed)
I had a long d/w patient about his remote stroke and mild cognitive impairment, risk for recurrent stroke/TIAs, personally independently reviewed imaging studies and stroke evaluation results and answered questions.Continue Plavix (clopidogrel 75 mg daily  for secondary stroke prevention and maintain strict control of hypertension with blood pressure goal below 130/90, diabetes with hemoglobin A1c goal below 6.5% and lipids with LDL cholesterol goal below 70 mg/dL. I also advised the patient to eat a healthy diet with plenty of whole grains, cereals, fruits and vegetables, exercise regularly and maintain ideal body weight .I encouraged him to continue participating in cognitively challenging activities like playing bridge, sudoku, Solving Crossword Puzzles.  Check Screening Follow-Up Carotid Ultrasound Study.  Followup in the future with me in 1 year or call earlier if necessary.

## 2022-03-07 LAB — CUP PACEART REMOTE DEVICE CHECK
Date Time Interrogation Session: 20231026001417
Implantable Pulse Generator Implant Date: 20210928

## 2022-03-10 ENCOUNTER — Ambulatory Visit (INDEPENDENT_AMBULATORY_CARE_PROVIDER_SITE_OTHER): Payer: 59

## 2022-03-10 DIAGNOSIS — I639 Cerebral infarction, unspecified: Secondary | ICD-10-CM

## 2022-03-31 ENCOUNTER — Ambulatory Visit (HOSPITAL_COMMUNITY)
Admission: RE | Admit: 2022-03-31 | Discharge: 2022-03-31 | Disposition: A | Discharge status: Home / Self Care | Payer: 59 | Source: Ambulatory Visit | Attending: Neurology | Admitting: Neurology

## 2022-03-31 DIAGNOSIS — Z8673 Personal history of transient ischemic attack (TIA), and cerebral infarction without residual deficits: Secondary | ICD-10-CM

## 2022-04-08 ENCOUNTER — Telehealth: Payer: Self-pay | Admitting: Neurology

## 2022-04-08 NOTE — Progress Notes (Signed)
Kindly inform the patient that carotid ultrasound study shows no significant narrowing of either carotid artery in the neck

## 2022-04-08 NOTE — Telephone Encounter (Signed)
-----   Message from Micki Riley, MD sent at 04/08/2022  8:45 AM EST ----- Kindly inform the patient that carotid ultrasound study shows no significant narrowing of either carotid artery in the neck.

## 2022-04-08 NOTE — Telephone Encounter (Signed)
Called the pt and advised of the normal carotid US finding. Pt verbalized understanding. Pt had no questions at this time but was encouraged to call back if questions arise.

## 2022-04-09 NOTE — Progress Notes (Signed)
Carelink Summary Report / Loop Recorder 

## 2022-04-14 ENCOUNTER — Ambulatory Visit (INDEPENDENT_AMBULATORY_CARE_PROVIDER_SITE_OTHER): Payer: 59

## 2022-04-14 DIAGNOSIS — I639 Cerebral infarction, unspecified: Secondary | ICD-10-CM | POA: Diagnosis not present

## 2022-04-14 LAB — CUP PACEART REMOTE DEVICE CHECK
Date Time Interrogation Session: 20231204001609
Implantable Pulse Generator Implant Date: 20210928

## 2022-05-19 ENCOUNTER — Ambulatory Visit (INDEPENDENT_AMBULATORY_CARE_PROVIDER_SITE_OTHER): Payer: Commercial Managed Care - PPO

## 2022-05-19 DIAGNOSIS — I639 Cerebral infarction, unspecified: Secondary | ICD-10-CM | POA: Diagnosis not present

## 2022-05-20 LAB — CUP PACEART REMOTE DEVICE CHECK
Date Time Interrogation Session: 20240108001115
Implantable Pulse Generator Implant Date: 20210928

## 2022-05-22 NOTE — Progress Notes (Signed)
Carelink Summary Report / Loop Recorder 

## 2022-06-22 LAB — CUP PACEART REMOTE DEVICE CHECK
Date Time Interrogation Session: 20240210000937
Implantable Pulse Generator Implant Date: 20210928

## 2022-06-23 ENCOUNTER — Ambulatory Visit: Payer: Commercial Managed Care - PPO

## 2022-06-23 DIAGNOSIS — I639 Cerebral infarction, unspecified: Secondary | ICD-10-CM | POA: Diagnosis not present

## 2022-06-23 NOTE — Progress Notes (Signed)
Carelink Summary Report / Loop Recorder 

## 2022-07-28 ENCOUNTER — Ambulatory Visit (INDEPENDENT_AMBULATORY_CARE_PROVIDER_SITE_OTHER): Payer: Commercial Managed Care - PPO

## 2022-07-28 DIAGNOSIS — I639 Cerebral infarction, unspecified: Secondary | ICD-10-CM | POA: Diagnosis not present

## 2022-07-29 LAB — CUP PACEART REMOTE DEVICE CHECK
Date Time Interrogation Session: 20240318001906
Implantable Pulse Generator Implant Date: 20210928

## 2022-08-05 NOTE — Progress Notes (Signed)
Carelink Summary Report / Loop Recorder 

## 2022-09-01 ENCOUNTER — Ambulatory Visit (INDEPENDENT_AMBULATORY_CARE_PROVIDER_SITE_OTHER): Payer: Commercial Managed Care - PPO

## 2022-09-01 DIAGNOSIS — I639 Cerebral infarction, unspecified: Secondary | ICD-10-CM

## 2022-09-01 LAB — CUP PACEART REMOTE DEVICE CHECK
Date Time Interrogation Session: 20240420000124
Implantable Pulse Generator Implant Date: 20210928

## 2022-09-05 NOTE — Progress Notes (Signed)
Carelink Summary Report / Loop Recorder 

## 2022-10-02 LAB — CUP PACEART REMOTE DEVICE CHECK
Date Time Interrogation Session: 20240523001030
Implantable Pulse Generator Implant Date: 20210928

## 2022-10-07 ENCOUNTER — Ambulatory Visit (INDEPENDENT_AMBULATORY_CARE_PROVIDER_SITE_OTHER): Payer: Commercial Managed Care - PPO

## 2022-10-07 DIAGNOSIS — I639 Cerebral infarction, unspecified: Secondary | ICD-10-CM

## 2022-10-07 NOTE — Progress Notes (Signed)
Carelink Summary Report / Loop Recorder 

## 2022-10-30 NOTE — Progress Notes (Signed)
Carelink Summary Report / Loop Recorder 

## 2022-11-03 ENCOUNTER — Telehealth: Payer: Self-pay

## 2022-11-03 NOTE — Telephone Encounter (Signed)
ILR alert for AF, duration , mean HR 136 ST with ectopy vs AF, burden 0%, no OAC - route to triage LA, CVRS  ILR for CVA.  Patient is doing well, stated he was running about that time and it is normal for him to see heart rates up to 150's during his jog. No symptoms.   He is aware that we will forward this to Dr. Lalla Brothers to eval for possible short run of AF and we will call him back if there are any concerns.

## 2022-11-04 LAB — CUP PACEART REMOTE DEVICE CHECK
Date Time Interrogation Session: 20240625000705
Implantable Pulse Generator Implant Date: 20210928

## 2022-11-04 NOTE — Telephone Encounter (Signed)
Reviewed with Otilio Saber, PA in office per protocol. Jeffery Willis states he suspects SR w/ PVC/s & ectopy noted. No AF suspected.

## 2022-11-10 ENCOUNTER — Ambulatory Visit (INDEPENDENT_AMBULATORY_CARE_PROVIDER_SITE_OTHER): Payer: Commercial Managed Care - PPO

## 2022-11-10 DIAGNOSIS — I639 Cerebral infarction, unspecified: Secondary | ICD-10-CM | POA: Diagnosis not present

## 2022-11-26 NOTE — Progress Notes (Signed)
 Carelink Summary Report / Loop Recorder 

## 2022-12-04 ENCOUNTER — Ambulatory Visit (INDEPENDENT_AMBULATORY_CARE_PROVIDER_SITE_OTHER): Payer: Commercial Managed Care - PPO | Admitting: Family Medicine

## 2022-12-04 ENCOUNTER — Encounter: Payer: Self-pay | Admitting: Family Medicine

## 2022-12-04 VITALS — BP 120/70 | HR 64 | Temp 97.7°F | Ht 73.0 in | Wt 192.0 lb

## 2022-12-04 DIAGNOSIS — Z8673 Personal history of transient ischemic attack (TIA), and cerebral infarction without residual deficits: Secondary | ICD-10-CM

## 2022-12-04 DIAGNOSIS — Z125 Encounter for screening for malignant neoplasm of prostate: Secondary | ICD-10-CM

## 2022-12-04 MED ORDER — PRAVASTATIN SODIUM 10 MG PO TABS
10.0000 mg | ORAL_TABLET | Freq: Every day | ORAL | 3 refills | Status: DC
Start: 2022-12-04 — End: 2023-05-26

## 2022-12-04 NOTE — Patient Instructions (Addendum)
Stay off atorvastatin.  If doing well, then try pravastatin in about 1 month.  Recheck fasting labs in October or November at or before a physical.  If not doing well in the meantime then let us know.  Take care.  Glad to see you.

## 2022-12-04 NOTE — Progress Notes (Signed)
Aches. Patient has had some muscle pain x 3-4 weeks in his neck, arms, legs and back. He stopped taking Lipitor 40mg  and that helped a lot- sx resolved after being off med for a few days.  Off med for 1 week in total.  Pain started about 3 weeks ago.  No other clear trigger.  No tick bite, trauma, fever, etc.  No CP.  He can still exercise at baseline when his work schedule allows.    Meds, vitals, and allergies reviewed.   ROS: Per HPI unless specifically indicated in ROS section   GEN: nad, alert and oriented HEENT: ncat NECK: supple w/o LA CV: rrr. PULM: ctab, no inc wob ABD: soft, +bs EXT: no edema SKIN: well perfused.  Normal neck ROM Biceps not ttp B

## 2022-12-04 NOTE — Assessment & Plan Note (Signed)
Statin indication d/w pt.  Stay off atorvastatin.   If doing well, then try pravastatin in about 1 month.  Recheck fasting labs in October or November at or before a physical.  If not doing well in the meantime then let us know.  He agrees.

## 2022-12-08 ENCOUNTER — Ambulatory Visit (INDEPENDENT_AMBULATORY_CARE_PROVIDER_SITE_OTHER): Payer: Commercial Managed Care - PPO

## 2022-12-08 DIAGNOSIS — I639 Cerebral infarction, unspecified: Secondary | ICD-10-CM

## 2022-12-10 DIAGNOSIS — I639 Cerebral infarction, unspecified: Secondary | ICD-10-CM | POA: Diagnosis not present

## 2022-12-15 ENCOUNTER — Encounter: Payer: Self-pay | Admitting: Family Medicine

## 2022-12-15 ENCOUNTER — Ambulatory Visit: Payer: Commercial Managed Care - PPO | Admitting: Family Medicine

## 2022-12-15 VITALS — BP 122/56 | HR 70 | Temp 97.9°F | Ht 73.0 in | Wt 187.0 lb

## 2022-12-15 DIAGNOSIS — R7989 Other specified abnormal findings of blood chemistry: Secondary | ICD-10-CM | POA: Diagnosis not present

## 2022-12-15 DIAGNOSIS — M79606 Pain in leg, unspecified: Secondary | ICD-10-CM

## 2022-12-15 MED ORDER — PREDNISONE 20 MG PO TABS
ORAL_TABLET | ORAL | 0 refills | Status: DC
Start: 2022-12-15 — End: 2022-12-30

## 2022-12-15 NOTE — Patient Instructions (Signed)
As long as getting better, then don't start prednisone.  If starting prednisone, take with food.   Update me as needed.  Take care.  Glad to see you.

## 2022-12-15 NOTE — Progress Notes (Unsigned)
Off lipitor and not yet on pravastatin.  Symptoms below are not attributable to pravastatin, discussed.  About 6 weeks ago had a red patch on the L lateral hip area.  Tender and numbness locally.  Used hydrocortisone and it resolved.  No blistering.  No crusting.    Then noted similar pain/numbness in the L lower back and lateral hip after the last OV except no redness.  More pain at night.  Pain improves laying on the R side.  No burning with urination, no dysuria.  No rash/skin changes with second episode.  Occ pain and numbness down the L lateral thigh and just past the knee and to the L foot.  No R leg sx.  No trauma, no trigger.  No weakness.  No foot drop.  No FCNAVD. He is some better in the last few days.    Meds, vitals, and allergies reviewed.   ROS: Per HPI unless specifically indicated in ROS section   Nad Ncat Neck supple Altered light touch and monofilament sensation on the L proximal lateral thigh.   Strength wnl BLE Back not ttp Able to bear weight.  Gait symmetric. Normal left hip range of motion.

## 2022-12-17 DIAGNOSIS — M79606 Pain in leg, unspecified: Secondary | ICD-10-CM | POA: Insufficient documentation

## 2022-12-17 NOTE — Assessment & Plan Note (Signed)
Concern for either L L5 or S1 nerve root irritation with downstream leg symptoms.  No weakness.  Improving in the last few days.  No emergent symptoms and no imaging needed right now.  As long as getting better, then don't start prednisone.  If starting prednisone, take with food.  Steroid cautions discussed with patient. Update me as needed.  He agrees to plan.

## 2022-12-24 NOTE — Progress Notes (Signed)
Carelink Summary Report / Loop Recorder 

## 2022-12-30 ENCOUNTER — Ambulatory Visit: Payer: Commercial Managed Care - PPO | Admitting: Family Medicine

## 2022-12-30 ENCOUNTER — Ambulatory Visit (INDEPENDENT_AMBULATORY_CARE_PROVIDER_SITE_OTHER)
Admission: RE | Admit: 2022-12-30 | Discharge: 2022-12-30 | Disposition: A | Discharge status: Home / Self Care | Payer: Commercial Managed Care - PPO | Source: Ambulatory Visit | Attending: Family Medicine | Admitting: Family Medicine

## 2022-12-30 ENCOUNTER — Encounter: Payer: Self-pay | Admitting: Family Medicine

## 2022-12-30 VITALS — BP 110/62 | HR 72 | Temp 98.0°F | Ht 73.0 in | Wt 184.0 lb

## 2022-12-30 DIAGNOSIS — M47816 Spondylosis without myelopathy or radiculopathy, lumbar region: Secondary | ICD-10-CM | POA: Diagnosis not present

## 2022-12-30 DIAGNOSIS — R202 Paresthesia of skin: Secondary | ICD-10-CM

## 2022-12-30 DIAGNOSIS — M5137 Other intervertebral disc degeneration, lumbosacral region: Secondary | ICD-10-CM | POA: Diagnosis not present

## 2022-12-30 NOTE — Progress Notes (Unsigned)
B lower back numbness - B similar dermatomal band that wraps to the B anterior abdomen.  He has cut out lifting weights.  Was able to run 2 miles recently and felt well at the time.    He thought he could feel a small bulge/lump in the LLQ- that is new in the last few months.  No blood in stool.  No black stools.  No FCNAVD.    Done with prev rx for prednisone.  Prev L leg leg sx resolved.   Meds, vitals, and allergies reviewed.   ROS: Per HPI unless specifically indicated in ROS section   Nad Ncat Neck supple Rrr Ctab  I don't feel a mass but he clearly has B (L>R) symmetric dermatomal lower abd paresthesia inferior to the umbilicus.  No rash.   S/S wnl BLE and BUE   Would need to include T10 and lower on imaging.

## 2022-12-30 NOTE — Patient Instructions (Signed)
Xray on the way out.  We'll be in touch . Take care.  Glad to see you.

## 2022-12-31 ENCOUNTER — Other Ambulatory Visit: Payer: Self-pay | Admitting: Family Medicine

## 2022-12-31 DIAGNOSIS — R202 Paresthesia of skin: Secondary | ICD-10-CM | POA: Insufficient documentation

## 2022-12-31 DIAGNOSIS — M4800 Spinal stenosis, site unspecified: Secondary | ICD-10-CM

## 2022-12-31 NOTE — Assessment & Plan Note (Signed)
I don't feel a mass but he clearly has B (L>R) symmetric dermatomal lower abd paresthesia inferior to the umbilicus, ie typical for the lower thoracic dermatomes..  No rash.   S/S wnl BLE and BUE I think it makes sense to check plain films first and go from there.  No emergent neurologic symptoms.

## 2023-01-01 ENCOUNTER — Encounter: Payer: Self-pay | Admitting: *Deleted

## 2023-01-06 ENCOUNTER — Encounter: Payer: Self-pay | Admitting: Family Medicine

## 2023-01-09 ENCOUNTER — Ambulatory Visit
Admission: RE | Admit: 2023-01-09 | Discharge: 2023-01-09 | Disposition: A | Discharge status: Home / Self Care | Payer: Commercial Managed Care - PPO | Source: Ambulatory Visit | Attending: Family Medicine | Admitting: Family Medicine

## 2023-01-09 DIAGNOSIS — M47816 Spondylosis without myelopathy or radiculopathy, lumbar region: Secondary | ICD-10-CM | POA: Diagnosis not present

## 2023-01-09 DIAGNOSIS — M4800 Spinal stenosis, site unspecified: Secondary | ICD-10-CM

## 2023-01-09 DIAGNOSIS — M5126 Other intervertebral disc displacement, lumbar region: Secondary | ICD-10-CM | POA: Diagnosis not present

## 2023-01-09 DIAGNOSIS — R202 Paresthesia of skin: Secondary | ICD-10-CM | POA: Diagnosis not present

## 2023-01-09 DIAGNOSIS — M545 Low back pain, unspecified: Secondary | ICD-10-CM | POA: Diagnosis not present

## 2023-01-10 LAB — CUP PACEART REMOTE DEVICE CHECK
Date Time Interrogation Session: 20240830000514
Implantable Pulse Generator Implant Date: 20210928

## 2023-01-13 ENCOUNTER — Ambulatory Visit (INDEPENDENT_AMBULATORY_CARE_PROVIDER_SITE_OTHER): Payer: Commercial Managed Care - PPO

## 2023-01-13 DIAGNOSIS — I639 Cerebral infarction, unspecified: Secondary | ICD-10-CM

## 2023-01-21 NOTE — Progress Notes (Signed)
Carelink Summary Report / Loop Recorder 

## 2023-02-05 ENCOUNTER — Ambulatory Visit: Payer: Commercial Managed Care - PPO | Admitting: Family Medicine

## 2023-02-05 ENCOUNTER — Encounter: Payer: Self-pay | Admitting: Family Medicine

## 2023-02-05 VITALS — BP 120/62 | HR 75 | Temp 97.5°F | Ht 73.0 in | Wt 181.0 lb

## 2023-02-05 DIAGNOSIS — Z23 Encounter for immunization: Secondary | ICD-10-CM

## 2023-02-05 DIAGNOSIS — R202 Paresthesia of skin: Secondary | ICD-10-CM | POA: Diagnosis not present

## 2023-02-05 DIAGNOSIS — Z8673 Personal history of transient ischemic attack (TIA), and cerebral infarction without residual deficits: Secondary | ICD-10-CM | POA: Diagnosis not present

## 2023-02-05 NOTE — Patient Instructions (Signed)
Keep jogging as is.  I would cut your weight and reps with lifting down to 1/3 initially.   Take care.  Glad to see you.

## 2023-02-05 NOTE — Progress Notes (Signed)
D/w pt about MRI.  He had sx at the time of the MRI but then sx resolved.  He ran 2 miles total yesterday and did well.  He feels fine.  No numbness, no tingling, no foot drop.  Normal abd wall sensation.  We talked about gradual return to lifting.  He isn't doing squats.    Meds, vitals, and allergies reviewed.   ROS: Per HPI unless specifically indicated in ROS section   Nad Ncat Neck supple, no LA Rrr Ctab Abd soft, not ttp Normal sensation abd wall.  Normal S/S BLE

## 2023-02-08 NOTE — Assessment & Plan Note (Signed)
Discussed that he could restart pravastatin after he has returned to exercise but I would only make 1 change at a time.

## 2023-02-08 NOTE — Assessment & Plan Note (Signed)
Resolved.  Previous MRI discussed with patient.  He has an unremarkable exam at this point.  I presume that the previously noted disc bulge is no longer causing symptoms.  I do not think it makes sense to see neurosurgery since his symptoms have resolved.  I think he can keep jogging as is.  I would cut his weight and reps with lifting down to 1/3 initially.  We talked about potentially getting a personal trainer at the [facility] where he exercises to help with return to full exercise.

## 2023-02-11 LAB — CUP PACEART REMOTE DEVICE CHECK
Date Time Interrogation Session: 20241002000956
Implantable Pulse Generator Implant Date: 20210928

## 2023-02-16 ENCOUNTER — Ambulatory Visit (INDEPENDENT_AMBULATORY_CARE_PROVIDER_SITE_OTHER): Payer: Commercial Managed Care - PPO

## 2023-02-16 DIAGNOSIS — I639 Cerebral infarction, unspecified: Secondary | ICD-10-CM | POA: Diagnosis not present

## 2023-02-20 ENCOUNTER — Other Ambulatory Visit: Payer: Commercial Managed Care - PPO

## 2023-02-25 ENCOUNTER — Ambulatory Visit: Payer: Commercial Managed Care - PPO | Attending: Cardiology | Admitting: Cardiology

## 2023-02-25 ENCOUNTER — Ambulatory Visit: Payer: 59 | Admitting: Neurology

## 2023-02-25 ENCOUNTER — Encounter: Payer: Self-pay | Admitting: Cardiology

## 2023-02-25 VITALS — BP 138/68 | HR 69 | Ht 73.0 in | Wt 183.0 lb

## 2023-02-25 DIAGNOSIS — I639 Cerebral infarction, unspecified: Secondary | ICD-10-CM | POA: Diagnosis not present

## 2023-02-25 DIAGNOSIS — I493 Ventricular premature depolarization: Secondary | ICD-10-CM

## 2023-02-25 DIAGNOSIS — I472 Ventricular tachycardia, unspecified: Secondary | ICD-10-CM | POA: Diagnosis not present

## 2023-02-25 NOTE — Progress Notes (Signed)
  Electrophysiology Office Follow up Visit Note:    Date:  02/25/2023   ID:  Jeffery Willis, DOB 30-Jan-1956, MRN [id number]  PCP:  Joaquim Nam, MD  [facility] Cardiologist:  None  [facility] Electrophysiologist:  Hillis Range, MD (Inactive)    Interval History:    Jeffery Willis is a 67 y.o. male who presents for a follow up visit.   Last seen December 30, 2021 by [facility].  The patient had a loop implanted September 2021 after a cryptogenic stroke by Dr. Johney Frame.  At that appointment his loop recorder was interrogated and showed a 17% PVC burden.  He has been doing well since he was last seen by [facility].     Past medical, surgical, social and family history were reviewed.  ROS:   Please see the history of present illness.    All other systems reviewed and are negative.  EKGs/Labs/Other Studies Reviewed:    The following studies were reviewed today:  January 27, 2022 echo EF 60-65 RV normal Trivial MR        Physical Exam:    VS:  BP 138/68   Pulse 69   Ht 6\' 1"  (1.854 m)   Wt 183 lb (83 kg)   SpO2 97%   BMI 24.14 kg/m     Wt Readings from Last 3 Encounters:  02/25/23 183 lb (83 kg)  02/05/23 181 lb (82.1 kg)  12/30/22 184 lb (83.5 kg)     GEN:  Well nourished, well developed in no acute distress CARDIAC: RRR, no murmurs, rubs, gallops RESPIRATORY:  Clear to auscultation without rales, wheezing or rhonchi       ASSESSMENT:    1. Cryptogenic stroke (HCC)   2. PVC's (premature ventricular contractions)   3. VT (ventricular tachycardia) (HCC)    PLAN:    In order of problems listed above:  #Cryptogenic stroke history Loop recorder in situ No atrial fibrillation detected thus far  #History of VT/PVCs Hemodynamically stable with a normal ejection fraction Continue metoprolol   Follow-up 1 year with APP      Signed, Steffanie Dunn, MD, [facility], [facility] 02/25/2023 2:48 PM    Electrophysiology [facility]

## 2023-02-25 NOTE — Patient Instructions (Signed)
Medication Instructions:  Your physician recommends that you continue on your current medications as directed. Please refer to the Current Medication list given to you today.  *If you need a refill on your cardiac medications before your next appointment, please call your pharmacy*  Follow-Up: At [facility], you and your health needs are our priority.  As part of our continuing mission to provide you with exceptional heart care, we have created designated Provider Care Teams.  These Care Teams include your primary Cardiologist (physician) and Advanced Practice Providers (APPs -  Physician Assistants and Nurse Practitioners) who all work together to provide you with the care you need, when you need it.  Your next appointment:   1 year  Provider:   You will see one of the following Advanced Practice Providers on your designated Care Team:   Francis Dowse, Charlott Holler [address] [place], [place] Sherie Don, NP Canary Brim, NP

## 2023-03-02 NOTE — Progress Notes (Signed)
Carelink Summary Report / Loop Recorder 

## 2023-03-09 ENCOUNTER — Ambulatory Visit: Payer: Commercial Managed Care - PPO | Admitting: Neurology

## 2023-03-23 ENCOUNTER — Ambulatory Visit: Payer: Commercial Managed Care - PPO

## 2023-03-23 DIAGNOSIS — I639 Cerebral infarction, unspecified: Secondary | ICD-10-CM | POA: Diagnosis not present

## 2023-03-24 LAB — CUP PACEART REMOTE DEVICE CHECK
Date Time Interrogation Session: 20241111000034
Implantable Pulse Generator Implant Date: 20210928

## 2023-04-03 ENCOUNTER — Ambulatory Visit
Admission: EM | Admit: 2023-04-03 | Discharge: 2023-04-03 | Disposition: A | Discharge status: Home / Self Care | Payer: Commercial Managed Care - PPO | Attending: Internal Medicine | Admitting: Internal Medicine

## 2023-04-03 DIAGNOSIS — H578A1 Foreign body sensation, right eye: Secondary | ICD-10-CM | POA: Diagnosis not present

## 2023-04-03 DIAGNOSIS — H5711 Ocular pain, right eye: Secondary | ICD-10-CM | POA: Diagnosis not present

## 2023-04-03 MED ORDER — TOBRAMYCIN 0.3 % OP SOLN: 1.0000 [drp] | OPHTHALMIC | 0 refills | Status: DC

## 2023-04-03 NOTE — ED Provider Notes (Signed)
[facility]RGENT [facility]  Note:  This document was prepared using Conservation officer, historic buildings and may include unintentional dictation errors.  MRN: [id number] DOB: [birth year]  Subjective:   Jeffery Willis is a 67 y.o. male presenting for 1 day history of a foreign body sensation in the right eye.  Symptoms started when he was doing some mowing, yard work today.  Does not wear contact lenses.  Has had redness and continued foreign body sensation despite washing out his eye.  No current facility-administered medications for this encounter.  Current Outpatient Medications:    clopidogrel (PLAVIX) 75 MG tablet, Take 75 mg by mouth daily., Disp: , Rfl:    metoprolol succinate (TOPROL XL) 50 MG 24 hr tablet, Take 1 tablet (50 mg total) by mouth daily., Disp: 90 tablet, Rfl: 3   Multiple Vitamin (MULTIVITAMIN) tablet, Take 1 tablet by mouth daily., Disp: , Rfl:    pravastatin (PRAVACHOL) 10 MG tablet, Take 1 tablet (10 mg total) by mouth daily., Disp: 90 tablet, Rfl: 3   Allergies  Allergen Reactions   Lipitor [Atorvastatin]     aches    Past Medical History:  Diagnosis Date   Achilles tendonitis 10/08/2017   Calculus of kidney 02/14/2010   Cerebrovascular accident 02/04/2020   L PCA stroke with hippocampal involvement   Generalized anxiety disorder    History of cardiovascular stress test 06/2005   normal myocardial perfusion. Given these results and the patient's persistence of flipped T waves, I think these are likely asymmptomatic and clinically insignificant findings   Mild vascular neurocognitive disorder 11/15/2020   Skin lesion      Past Surgical History:  Procedure Laterality Date   2 D Echo  06/2005   normal LV function and no LVH.  Normal 2D echowith ER60 to 70%   BUBBLE STUDY  02/07/2020   Procedure: BUBBLE STUDY;  Surgeon: [name], MD;  Location: [facility] ENDOSCOPY;  Service: Cardiovascular;;   KIDNEY STONE SURGERY     LOOP RECORDER INSERTION N/A  02/07/2020   Procedure: LOOP RECORDER INSERTION;  Surgeon: Hillis Range, MD;  Location: [facility] INVASIVE CV LAB;  Service: Cardiovascular;  Laterality: N/A;   TEE WITHOUT CARDIOVERSION N/A 02/07/2020   Procedure: TRANSESOPHAGEAL ECHOCARDIOGRAM (TEE);  Surgeon: [name], MD;  Location: [facility] ENDOSCOPY;  Service: Cardiovascular;  Laterality: N/A;    Family History  Problem Relation Age of Onset   Heart disease Mother        arrhythmia   Stroke Mother    Hypertension Father        mild   Arthritis Father    Memory loss Maternal Grandmother        possible undiagnosed Alzheimer's disease   Prostate cancer Paternal Grandfather    Colon cancer Neg Hx     Social History   Tobacco Use   Smoking status: Never   Smokeless tobacco: Never  Vaping Use   Vaping status: Never Used  Substance Use Topics   Alcohol use: No   Drug use: No    ROS   Objective:   Vitals: BP (!) 164/80 (BP Location: Right Arm)   Pulse 73   Resp 20   SpO2 98%   Physical Exam Constitutional:      General: He is not in acute distress.    Appearance: Normal appearance. He is well-developed and normal weight. He is not ill-appearing, toxic-appearing or diaphoretic.  HENT:     Head: Normocephalic and atraumatic.     Right Ear:  External ear normal.     Left Ear: External ear normal.     Nose: Nose normal.     Mouth/Throat:     Pharynx: Oropharynx is clear.  Eyes:     General: Lids are everted, no foreign bodies appreciated. No scleral icterus.       Right eye: No foreign body, discharge or hordeolum.        Left eye: No foreign body, discharge or hordeolum.     Extraocular Movements: Extraocular movements intact.     Conjunctiva/sclera:     Right eye: Right conjunctiva is injected. No chemosis, exudate or hemorrhage.    Left eye: Left conjunctiva is not injected. No chemosis, exudate or hemorrhage. Cardiovascular:     Rate and Rhythm: Normal rate.  Pulmonary:     Effort: Pulmonary effort is normal.   Musculoskeletal:     Cervical back: Normal range of motion.  Neurological:     Mental Status: He is alert and oriented to person, place, and time.  Psychiatric:        Mood and Affect: Mood normal.        Behavior: Behavior normal.        Thought Content: Thought content normal.        Judgment: Judgment normal.    Eye Exam: Eyelids everted and swept for foreign body. The eye was anesthetized with 2 drops of tetracaine and stained with fluorescein. Examination under woods lamp does not reveal a foreign body or area of increased stain uptake. The eye was then irrigated copiously with saline.   Assessment and Plan :   PDMP not reviewed this encounter.  1. Acute right eye pain   2. Foreign body sensation, right eye    Recommended covering for secondary infection with tobramycin.  Use conservative management otherwise.  Follow-up with an ophthalmologist should symptoms persist.  Counseled patient on potential for adverse effects with medications prescribed/recommended today, ER and return-to-clinic precautions discussed, patient verbalized understanding.    Wallis Bamberg, [place] 04/03/23 1458

## 2023-04-03 NOTE — ED Triage Notes (Addendum)
Pt c/o FB right eye while mowing today-NAD-steady gait

## 2023-04-03 NOTE — Discharge Instructions (Signed)
Start tobramycin eye drops to cover for secondary bacterial infection from having a possible foreign body. If you continue to have symptoms over the weekend you should present to the emergency room to consult an eye specialist emergently. Alternatively, you can contact an eye specialist clinic early next week if your symptoms are manageable.

## 2023-04-16 NOTE — Progress Notes (Signed)
Carelink Summary Report / Loop Recorder 

## 2023-04-27 ENCOUNTER — Ambulatory Visit (INDEPENDENT_AMBULATORY_CARE_PROVIDER_SITE_OTHER): Payer: Commercial Managed Care - PPO

## 2023-04-27 DIAGNOSIS — I639 Cerebral infarction, unspecified: Secondary | ICD-10-CM

## 2023-04-27 LAB — CUP PACEART REMOTE DEVICE CHECK
Date Time Interrogation Session: 20241216000051
Implantable Pulse Generator Implant Date: 20210928

## 2023-05-19 ENCOUNTER — Other Ambulatory Visit (INDEPENDENT_AMBULATORY_CARE_PROVIDER_SITE_OTHER): Payer: Medicare Other

## 2023-05-19 DIAGNOSIS — Z8673 Personal history of transient ischemic attack (TIA), and cerebral infarction without residual deficits: Secondary | ICD-10-CM | POA: Diagnosis not present

## 2023-05-19 DIAGNOSIS — Z125 Encounter for screening for malignant neoplasm of prostate: Secondary | ICD-10-CM | POA: Diagnosis not present

## 2023-05-19 LAB — LIPID PANEL
Cholesterol: 132 mg/dL (ref 0–200)
HDL: 49.8 mg/dL (ref >39.00)
LDL Cholesterol: 69 mg/dL (ref 0–99)
NonHDL: 81.81
Total CHOL/HDL Ratio: 3
Triglycerides: 64 mg/dL (ref 0.0–149.0)
VLDL: 12.8 mg/dL (ref 0.0–40.0)

## 2023-05-19 LAB — COMPREHENSIVE METABOLIC PANEL
ALT: 24 U/L (ref 0–53)
AST: 23 U/L (ref 0–37)
Albumin: 4.5 g/dL (ref 3.5–5.2)
Alkaline Phosphatase: 56 U/L (ref 39–117)
BUN: 19 mg/dL (ref 6–23)
CO2: 31 meq/L (ref 19–32)
Calcium: 9.8 mg/dL (ref 8.4–10.5)
Chloride: 103 meq/L (ref 96–112)
Creatinine, Ser: 0.91 mg/dL (ref 0.40–1.50)
GFR: 87.56 mL/min (ref >60.00)
Glucose, Bld: 93 mg/dL (ref 70–99)
Potassium: 4.3 meq/L (ref 3.5–5.1)
Sodium: 141 meq/L (ref 135–145)
Total Bilirubin: 0.9 mg/dL (ref 0.2–1.2)
Total Protein: 7.2 g/dL (ref 6.0–8.3)

## 2023-05-19 LAB — CBC WITH DIFFERENTIAL/PLATELET
Basophils Absolute: 0.1 10*3/uL (ref 0.0–0.1)
Basophils Relative: 0.7 % (ref 0.0–3.0)
Eosinophils Absolute: 0.1 10*3/uL (ref 0.0–0.7)
Eosinophils Relative: 1.4 % (ref 0.0–5.0)
HCT: 46.7 % (ref 39.0–52.0)
Hemoglobin: 15.6 g/dL (ref 13.0–17.0)
Lymphocytes Relative: 12.2 % (ref 12.0–46.0)
Lymphs Abs: 0.9 10*3/uL (ref 0.7–4.0)
MCHC: 33.3 g/dL (ref 30.0–36.0)
MCV: 92.5 fL (ref 78.0–100.0)
Monocytes Absolute: 0.6 10*3/uL (ref 0.1–1.0)
Monocytes Relative: 8 % (ref 3.0–12.0)
Neutro Abs: 6 10*3/uL (ref 1.4–7.7)
Neutrophils Relative %: 77.7 % — ABNORMAL HIGH (ref 43.0–77.0)
Platelets: 239 10*3/uL (ref 150.0–400.0)
RBC: 5.05 Mil/uL (ref 4.22–5.81)
RDW: 13.6 % (ref 11.5–15.5)
WBC: 7.7 10*3/uL (ref 4.0–10.5)

## 2023-05-19 LAB — TSH: TSH: 2.06 u[IU]/mL (ref 0.35–5.50)

## 2023-05-19 LAB — PSA: PSA: 5.04 ng/mL — ABNORMAL HIGH (ref 0.10–4.00)

## 2023-05-26 ENCOUNTER — Ambulatory Visit (INDEPENDENT_AMBULATORY_CARE_PROVIDER_SITE_OTHER): Payer: Medicare Other | Admitting: Family Medicine

## 2023-05-26 ENCOUNTER — Encounter: Payer: Self-pay | Admitting: Family Medicine

## 2023-05-26 VITALS — BP 132/64 | HR 60 | Temp 98.5°F | Ht 72.5 in | Wt 190.4 lb

## 2023-05-26 DIAGNOSIS — Z8673 Personal history of transient ischemic attack (TIA), and cerebral infarction without residual deficits: Secondary | ICD-10-CM | POA: Diagnosis not present

## 2023-05-26 DIAGNOSIS — R972 Elevated prostate specific antigen [PSA]: Secondary | ICD-10-CM | POA: Diagnosis not present

## 2023-05-26 DIAGNOSIS — Z7189 Other specified counseling: Secondary | ICD-10-CM

## 2023-05-26 DIAGNOSIS — Z1211 Encounter for screening for malignant neoplasm of colon: Secondary | ICD-10-CM

## 2023-05-26 DIAGNOSIS — Z125 Encounter for screening for malignant neoplasm of prostate: Secondary | ICD-10-CM

## 2023-05-26 DIAGNOSIS — Z0000 Encounter for general adult medical examination without abnormal findings: Secondary | ICD-10-CM

## 2023-05-26 MED ORDER — PRAVASTATIN SODIUM 10 MG PO TABS: 10.0000 mg | ORAL_TABLET | Freq: Every day | ORAL | 3 refills | Status: AC

## 2023-05-26 NOTE — Patient Instructions (Addendum)
 You should get a call about seeing GI.  Let me know if they don't call you.  Take care.  Glad to see you.  Recheck PSA at nonfasting lab visit in early March.

## 2023-05-26 NOTE — Progress Notes (Signed)
 Tdap 2012 Flu 2024 PNA vaccine d/w pt.  Covid prev done.  Shingrix d/w pt.   Wife designated if patient if patient were incapacitated.  D/w patient mz:[id number] for colon cancer screening, including IFOB vs. colonoscopy.  Risks and benefits of both were discussed and patient voiced understanding.  Pt elects for: colonoscopy. Referral placed.   PSA elevation.  No LUTS except for a few drops of terminal dribbling.  No hematuria.  D/w pt about options, uro vs recheck here.  He opted for recheck here.   H/o CVA.  Hypertension:    Using medication without problems or lightheadedness: yes Chest pain with exertion:no Edema:no Short of breath:no Muscle aches: some occ shin pain but not diffuse aches.   Diet compliance:yes Exercise: yes He had cardiology f/u re: loop recorder.  No skipped beats noted by patient.   Labs d/w pt.    No back pain. He isn't lifting much weight but is able to exercise 3-4 times per week, running 2 miles per episode.    Meds, vitals, and allergies reviewed.   PMH and SH reviewed  ROS: Per HPI unless specifically indicated in ROS section   GEN: nad, alert and oriented HEENT: ncat NECK: supple w/o LA CV: rrr. PULM: ctab, no inc wob ABD: soft, +bs EXT: no edema SKIN: no acute rash Prostate with B enlargement but no nodularity or tenderness.    30 minutes were devoted to patient care in this encounter (this includes time spent reviewing the patient's file/history, interviewing and examining the patient, counseling/reviewing plan with patient).

## 2023-05-27 DIAGNOSIS — Z7189 Other specified counseling: Secondary | ICD-10-CM | POA: Insufficient documentation

## 2023-05-27 DIAGNOSIS — Z0000 Encounter for general adult medical examination without abnormal findings: Secondary | ICD-10-CM | POA: Insufficient documentation

## 2023-05-27 DIAGNOSIS — R972 Elevated prostate specific antigen [PSA]: Secondary | ICD-10-CM | POA: Insufficient documentation

## 2023-05-27 NOTE — Assessment & Plan Note (Signed)
 Continue metoprolol  plavix  and pravastatin .  Continue work on diet and exercise.

## 2023-05-27 NOTE — Assessment & Plan Note (Signed)
 PSA elevation.  D/w pt about options, uro referral vs recheck here.  He opted for recheck here.  Recheck PSA at nonfasting lab visit in early March.

## 2023-05-27 NOTE — Assessment & Plan Note (Signed)
Wife designated if patient if patient were incapacitated.

## 2023-05-27 NOTE — Assessment & Plan Note (Signed)
 Tdap 2012 Flu 2024 PNA vaccine d/w pt.  Covid prev done.  Shingrix d/w pt.   Wife designated if patient if patient were incapacitated.  D/w patient QI:[id number] for colon cancer screening, including IFOB vs. colonoscopy.  Risks and benefits of both were discussed and patient voiced understanding.  Pt elects for: colonoscopy. Referral placed.   PSA elevation.  No LUTS except for a few drops of terminal dribbling.  No hematuria.  D/w pt about options, uro vs recheck here.  He opted for recheck here.

## 2023-06-01 ENCOUNTER — Ambulatory Visit (INDEPENDENT_AMBULATORY_CARE_PROVIDER_SITE_OTHER): Payer: Commercial Managed Care - PPO

## 2023-06-01 ENCOUNTER — Encounter: Payer: Self-pay | Admitting: Cardiology

## 2023-06-01 DIAGNOSIS — I639 Cerebral infarction, unspecified: Secondary | ICD-10-CM | POA: Diagnosis not present

## 2023-06-01 LAB — CUP PACEART REMOTE DEVICE CHECK
Date Time Interrogation Session: 20250120000410
Implantable Pulse Generator Implant Date: 20210928

## 2023-06-01 NOTE — Progress Notes (Signed)
Carelink Summary Report / Loop Recorder 

## 2023-06-15 ENCOUNTER — Telehealth: Payer: Self-pay

## 2023-06-15 NOTE — Telephone Encounter (Signed)
Copied from CRM [id number]. Topic: General - Other >> Jun 15, 2023  9:43 AM Truddie Crumble wrote: Reason for CRM: patient called stating no one has called him regarding the colonoscopy

## 2023-06-16 NOTE — Telephone Encounter (Signed)
 The Referred To office is always located within the referral. You are always welcome to provider the phone number of that office and assure patients that they can call the office- they do ot have to wait on the office to call them. Thanks for always trying to help all our patients!

## 2023-06-16 NOTE — Telephone Encounter (Signed)
 Good morning, Jeffery Willis.  Your referral was sent to [facility].   You ae welcome to give them a call directly to schedule your visit. Pleas be assured, every effort is made by the specialty office to make contact with patients, however, you are always empowered to contact them to ensure your needs are met in a timely manner for your health.  [facility] at [facility] [address] [place], [place] [address] Phone: [phone]  Thank you for your patience and please let us  know if you you need anything further. I hope you have a great day!!   Thank You! -Tobias Ally Referral Coordinator

## 2023-06-23 ENCOUNTER — Telehealth: Payer: Self-pay | Admitting: Family Medicine

## 2023-06-23 NOTE — Telephone Encounter (Signed)
Copied from CRM [id number]. Topic: General - Other >> Jun 23, 2023 12:28 PM Truddie Crumble wrote: Reason for CRM:  patient called stating no one has called him regarding the colonoscopy yet

## 2023-06-24 NOTE — Telephone Encounter (Signed)
Returned call to patient and left voicemail per DPR with the contact number for [facility] [phone].

## 2023-06-25 ENCOUNTER — Encounter: Payer: Self-pay | Admitting: Physician Assistant

## 2023-07-09 NOTE — Progress Notes (Signed)
 Carelink Summary Report / Loop Recorder

## 2023-07-21 ENCOUNTER — Other Ambulatory Visit (INDEPENDENT_AMBULATORY_CARE_PROVIDER_SITE_OTHER): Payer: Medicare Other

## 2023-07-21 DIAGNOSIS — Z125 Encounter for screening for malignant neoplasm of prostate: Secondary | ICD-10-CM

## 2023-07-21 LAB — PSA, MEDICARE: PSA: 3.57 ng/mL (ref 0.10–4.00)

## 2023-07-26 ENCOUNTER — Encounter: Payer: Self-pay | Admitting: Family Medicine

## 2023-08-05 NOTE — Progress Notes (Unsigned)
 08/06/2023 Jeffery Willis [id number] 03/24/1956  Referring provider: Joaquim Nam, MD Primary GI doctor: Dr. Leonides Schanz  ASSESSMENT AND PLAN:     Colonoscopy recall Overdue for colonoscopy since 2020. Last colonoscopy in 2010 showed hemorrhoids, otherwise normal. No current symptoms or family history of colon cancer. Explained risks of colonoscopy including perforation, bleeding, and sedation-related risks.  - Schedule colonoscopy. - Obtain permission from Dr. Para March to hold clopidogrel for 5 days prior to colonoscopy. - We have discussed the risks of bleeding, infection, perforation, medication reactions, and remote risk of death associated with colonoscopy. All questions were answered and the patient acknowledges these risk and wishes to proceed.  Right upper quadrant fullness Fullness in right upper quadrant on exam. Differential includes muscular causes, hernia, or other abnormalities. Previous elevated liver function tests normalized 2022. MRI showed liver cyst, normal pancreas, decompressed gallbladder. Further evaluation needed to rule out significant pathology. - Order right upper quadrant ultrasound to evaluate fullness.  Stroke Stroke in 2021 with no identified cause. On clopidogrel, loop recorder in place. Blood pressure and cholesterol well-managed.  Hold Plavix for 5 days before procedure will instruct when and how to resume after procedure.  Patient understands that there is a low but real risk of cardiovascular event such as heart attack, stroke, or embolism /  thrombosis, or ischemia while off Plavix.  The patient consents to proceed.  Will communicate by phone or EMR with patient's prescribing provider to confirm that holding Plavix is reasonable in this case.    Patient Care Team: Joaquim Nam, MD as PCP - Kevin Fenton, MD (Inactive) as PCP - Electrophysiology (Cardiology) Hillis Range, MD (Inactive) as Consulting Physician (Cardiology)  HISTORY  OF PRESENT ILLNESS:  Discussed the use of AI scribe software for clinical note transcription with the patient, who gave verbal consent to proceed.  History of Present Illness   Jeffery Willis "Jeffery Willis" is a 68 year old male who presents for an overdue colonoscopy recall.  His last colonoscopy was in April 2010, which showed hemorrhoids but was otherwise normal. He was due for a follow-up in 2020, which was delayed. No changes in bowel habits, diarrhea, constipation, blood in the stool, weight loss, or upper gastrointestinal symptoms. No abdominal pain or discomfort from hemorrhoids.  He experienced a stroke in 2021, for which no specific cause was identified. His blood pressure and cholesterol levels were reportedly good at that time. He currently has a loop recorder and is on Plavix.  In the past, he had elevated liver function tests, which normalized over time. An MRI of the liver showed a small cyst, and a hepatitis panel was negative.  He had a temporary increase in PSA levels, which later returned to normal.  No family history of colon cancer. He denies alcohol use.      He  reports that he has never smoked. He has never used smokeless tobacco. He reports that he does not drink alcohol and does not use drugs.  RELEVANT GI HISTORY, IMAGING AND LABS: Results   LABS PSA: Elevated, then normalized Hepatitis panel: Negative  RADIOLOGY MRI liver: Liver cyst, normal pancreas  DIAGNOSTIC Colonoscopy: Hemorrhoids (08/2008)      CBC    Component Value Date/Time   WBC 7.7 05/19/2023 0945   RBC 5.05 05/19/2023 0945   HGB 15.6 05/19/2023 0945   HCT 46.7 05/19/2023 0945   PLT 239.0 05/19/2023 0945   MCV 92.5 05/19/2023 0945   MCH 30.7 02/05/2020 1024  MCHC 33.3 05/19/2023 0945   RDW 13.6 05/19/2023 0945   LYMPHSABS 0.9 05/19/2023 0945   MONOABS 0.6 05/19/2023 0945   EOSABS 0.1 05/19/2023 0945   BASOSABS 0.1 05/19/2023 0945   Recent Labs    05/19/23 0945  HGB 15.6     CMP     Component Value Date/Time   NA 141 05/19/2023 0945   NA 140 12/30/2021 1124   K 4.3 05/19/2023 0945   CL 103 05/19/2023 0945   CO2 31 05/19/2023 0945   GLUCOSE 93 05/19/2023 0945   BUN 19 05/19/2023 0945   BUN 19 12/30/2021 1124   CREATININE 0.91 05/19/2023 0945   CALCIUM 9.8 05/19/2023 0945   PROT 7.2 05/19/2023 0945   ALBUMIN 4.5 05/19/2023 0945   AST 23 05/19/2023 0945   ALT 24 05/19/2023 0945   ALKPHOS 56 05/19/2023 0945   BILITOT 0.9 05/19/2023 0945   GFRNONAA 80 07/05/2020 1115   GFRAA 93 07/05/2020 1115      Latest Ref Rng & Units 05/19/2023    9:45 AM 10/29/2021   11:11 AM 07/22/2021   10:18 AM  Hepatic Function  Total Protein 6.0 - 8.3 g/dL 7.2  6.5  6.5   Albumin 3.5 - 5.2 g/dL 4.5  4.1  4.3   AST 0 - 37 U/L 23  32  32   ALT 0 - 53 U/L 24  38  43   Alk Phosphatase 39 - 117 U/L 56  68  58   Total Bilirubin 0.2 - 1.2 mg/dL 0.9  1.3  1.5   Bilirubin, Direct 0.0 - 0.3 mg/dL   0.4       Current Medications:    Current Outpatient Medications (Cardiovascular):    metoprolol succinate (TOPROL XL) 50 MG 24 hr tablet, Take 1 tablet (50 mg total) by mouth daily.   pravastatin (PRAVACHOL) 10 MG tablet, Take 1 tablet (10 mg total) by mouth daily.    Current Outpatient Medications (Hematological):    clopidogrel (PLAVIX) 75 MG tablet, Take 75 mg by mouth daily.  Current Outpatient Medications (Other):    Multiple Vitamin (MULTIVITAMIN) tablet, Take 1 tablet by mouth daily.  Medical History:  Past Medical History:  Diagnosis Date   Achilles tendonitis 10/08/2017   Calculus of kidney 02/14/2010   Cerebrovascular accident 02/04/2020   L PCA stroke with hippocampal involvement   Generalized anxiety disorder    High blood pressure    History of cardiovascular stress test 06/2005   normal myocardial perfusion. Given these results and the patient's persistence of flipped T waves, I think these are likely asymmptomatic and clinically insignificant findings    Kidney stones    Mild vascular neurocognitive disorder 11/15/2020   Skin lesion    Stroke (cerebrum) (HCC)    Allergies:  Allergies  Allergen Reactions   Lipitor [Atorvastatin]     aches     Surgical History:  He  has a past surgical history that includes 2 D Echo (06/2005); Kidney stone surgery; LOOP RECORDER INSERTION (N/A, 02/07/2020); TEE without cardioversion (N/A, 02/07/2020); and Bubble study (02/07/2020). Family History:  His family history includes Arthritis in his father; Heart disease in his mother; Hypertension in his father; Memory loss in his maternal grandmother; Prostate cancer in his paternal grandfather; Stroke in his mother.  REVIEW OF SYSTEMS  : All other systems reviewed and negative except where noted in the History of Present Illness.  PHYSICAL EXAM: BP 122/68   Pulse (!) 54   Ht 6'  1" (1.854 m)   Wt 191 lb (86.6 kg)   BMI 25.20 kg/m  Physical Exam   GENERAL APPEARANCE: Well nourished, in no apparent distress. HEENT: No cervical lymphadenopathy, unremarkable thyroid, sclerae anicteric, conjunctiva pink. RESPIRATORY: Respiratory effort normal, breath sounds clear to auscultation bilaterally without rales, rhonchi, or wheezing. CARDIO: Regular rate and rhythm with no murmurs, rubs, or gallops, peripheral pulses intact. ABDOMEN: Soft, non-distended, active bowel sounds in all four quadrants, non-tender to palpation, fullness in right upper quadrant, possible mass, no rebound RECTAL: Declines. MUSCULOSKELETAL: Full range of motion, normal gait, without edema. SKIN: Dry, intact without rashes or lesions. No jaundice. NEURO: Alert, oriented, no focal deficits. PSYCH: Cooperative, normal mood and affect. EXTREMITIES: No edema.      Doree Albee, PA-C 9:24 AM

## 2023-08-06 ENCOUNTER — Encounter: Payer: Self-pay | Admitting: Physician Assistant

## 2023-08-06 ENCOUNTER — Ambulatory Visit: Payer: Medicare Other | Admitting: Physician Assistant

## 2023-08-06 ENCOUNTER — Telehealth: Payer: Self-pay

## 2023-08-06 VITALS — BP 122/68 | HR 54 | Ht 73.0 in | Wt 191.0 lb

## 2023-08-06 DIAGNOSIS — Z8673 Personal history of transient ischemic attack (TIA), and cerebral infarction without residual deficits: Secondary | ICD-10-CM | POA: Diagnosis not present

## 2023-08-06 DIAGNOSIS — R198 Other specified symptoms and signs involving the digestive system and abdomen: Secondary | ICD-10-CM

## 2023-08-06 DIAGNOSIS — Z1211 Encounter for screening for malignant neoplasm of colon: Secondary | ICD-10-CM

## 2023-08-06 MED ORDER — SUFLAVE 178.7 G PO SOLR
1.0000 | Freq: Once | ORAL | 0 refills | Status: AC
Start: 2023-08-06 — End: 2023-08-06

## 2023-08-06 NOTE — Telephone Encounter (Signed)
   Patient Name: Jeffery Willis  DOB: [birth year] MRN: [id number]  Primary Cardiologist: None  Chart reviewed as part of pre-operative protocol coverage. To summarize recommendations:  -It does no appear that we refill his plavix, hx of stroke. Would reach out to prescribing physician for holding recommendations.    Medical clearance not requested however, he was doing well from a CV standpoint back in Oct when we saw him in the office.  Will route this bundled recommendation to requesting provider via Epic fax function and remove from pre-op pool. Please call with questions.  Sharlene Dory, PA-C 08/06/2023, 4:40 PM

## 2023-08-06 NOTE — Telephone Encounter (Signed)
 [facility] Pre-operative Risk Assessment     Request for surgical clearance:     Endoscopy Procedure  What type of surgery is being performed?     Colonoscopy  When is this surgery scheduled?     09/07/23  What type of clearance is required ?   Pharmacy  Are there any medications that need to be held prior to surgery and how long?  Plavix 5 days  Practice name and name of physician performing surgery?      [facility]  What is your office phone and fax number?      Phone- [phone]  Fax- [phone]  Anesthesia type (None, local, MAC, general) ?       MAC   Please route your response to [employer]

## 2023-08-06 NOTE — Patient Instructions (Addendum)
 You have been scheduled for an abdominal ultrasound at [facility] (1st floor of hospital) on 08/12/23 at 8:30. Please arrive 30 minutes prior to your appointment for registration. Make certain not to have anything to eat or drink after midnight prior to your appointment. Should you need to reschedule your appointment, please contact radiology at [phone]. This test typically takes about 30 minutes to perform.  You have been scheduled for a colonoscopy. Please follow written instructions given to you at your visit today.   If you use inhalers (even only as needed), please bring them with you on the day of your procedure.  DO NOT TAKE 7 DAYS PRIOR TO TEST- Trulicity (dulaglutide) Ozempic, Wegovy (semaglutide) Mounjaro (tirzepatide) Bydureon Bcise (exanatide extended release)  DO NOT TAKE 1 DAY PRIOR TO YOUR TEST Rybelsus (semaglutide) Adlyxin (lixisenatide) Victoza (liraglutide) Byetta (exanatide) ___________________________________________________________________________  Bonita Quin will receive your bowel preparation through [facility], which ensures the lowest copay and home delivery, with outreach via text or call from an [phone] number. Please respond promptly to avoid rescheduling of your procedure. If you are interested in alternative options or have any questions regarding your prep, please contact them at [phone] ____________________________________________________________________________  Your Provider Has Sent Your Bowel Prep Regimen To [facility]   [facility] will contact you to verify your information and collect your copay, if applicable. Enjoy the comfort of your home while your prescription is mailed to you, FREE of any shipping charges.   [facility] accepts all major insurance benefits and applies discounts & coupons.  Have additional questions?   Chat: [email or web address] Call: [phone] Email: [email or web address] NCPDP: [id number]  How will  [facility] contact you?  With a Welcome phone call,  a Welcome text and a checkout link in text form.  Texts you receive from [phone] Are NOT Spam.  *To set up delivery, you must complete the checkout process via link or speak to one of the patient care representatives. If [facility] is unable to reach you, your prescription may be delayed.  To avoid long hold times on the phone, you may also utilize the secure chat feature on the [facility] website to request that they call you back for transaction completion or to expedite your concerns. Due to recent changes in healthcare laws, you may see the results of your imaging and laboratory studies on MyChart before your provider has had a chance to review them.  We understand that in some cases there may be results that are confusing or concerning to you. Not all laboratory results come back in the same time frame and the provider may be waiting for multiple results in order to interpret others.  Please give [place] 48 hours in order for your provider to thoroughly review all the results before contacting the office for clarification of your results.  _______________________________________________________  If your blood pressure at your visit was 140/90 or greater, please contact your primary care physician to follow up on this.  _______________________________________________________  If you are age 52 or older, your body mass index should be between 23-30. Your Body mass index is 25.2 kg/m. If this is out of the aforementioned range listed, please consider follow up with your Primary Care Provider.  If you are age 86 or younger, your body mass index should be between 19-25. Your Body mass index is 25.2 kg/m. If this is out of the aformentioned range listed, please consider follow up with your Primary Care Provider.   ________________________________________________________  The [facility] providers would like to encourage  you to use [facility] to  communicate with providers for non-urgent requests or questions.  Due to long hold times on the telephone, sending your provider a message by [facility] may be a faster and more efficient way to get a response.  Please allow 48 business hours for a response.  Please remember that this is for non-urgent requests.  _______________________________________________________

## 2023-08-06 NOTE — Progress Notes (Signed)
 I agree with the assessment and plan as outlined by Ms. Steffanie Dunn.

## 2023-08-07 NOTE — Telephone Encounter (Signed)
 Preoperative team, please write request to requesting office.  Recommendations for holding Plavix will need to come from prescribing provider due to history of CVA.  Thank you.  Thomasene Ripple. Saffron Busey NP-C     08/07/2023, 7:48 AM [facility] [address] Office [phone] Fax [phone]

## 2023-08-10 ENCOUNTER — Ambulatory Visit (INDEPENDENT_AMBULATORY_CARE_PROVIDER_SITE_OTHER): Payer: Commercial Managed Care - PPO

## 2023-08-10 ENCOUNTER — Encounter: Payer: Self-pay | Admitting: Neurology

## 2023-08-10 ENCOUNTER — Ambulatory Visit: Payer: Commercial Managed Care - PPO | Admitting: Neurology

## 2023-08-10 DIAGNOSIS — I639 Cerebral infarction, unspecified: Secondary | ICD-10-CM

## 2023-08-10 LAB — CUP PACEART REMOTE DEVICE CHECK
Date Time Interrogation Session: 20250331000412
Implantable Pulse Generator Implant Date: 20210928

## 2023-08-12 ENCOUNTER — Ambulatory Visit (HOSPITAL_COMMUNITY)
Admission: RE | Admit: 2023-08-12 | Discharge: 2023-08-12 | Disposition: A | Discharge status: Home / Self Care | Source: Ambulatory Visit | Attending: Physician Assistant | Admitting: Physician Assistant

## 2023-08-12 DIAGNOSIS — R198 Other specified symptoms and signs involving the digestive system and abdomen: Secondary | ICD-10-CM | POA: Insufficient documentation

## 2023-08-15 ENCOUNTER — Encounter: Payer: Self-pay | Admitting: Cardiology

## 2023-09-07 ENCOUNTER — Encounter: Admitting: Internal Medicine

## 2023-09-14 ENCOUNTER — Ambulatory Visit (INDEPENDENT_AMBULATORY_CARE_PROVIDER_SITE_OTHER): Payer: Self-pay

## 2023-09-14 DIAGNOSIS — I639 Cerebral infarction, unspecified: Secondary | ICD-10-CM

## 2023-09-14 LAB — CUP PACEART REMOTE DEVICE CHECK
Date Time Interrogation Session: 20250505000603
Implantable Pulse Generator Implant Date: 20210928

## 2023-09-16 ENCOUNTER — Encounter: Payer: Self-pay | Admitting: Cardiology

## 2023-09-23 NOTE — Progress Notes (Signed)
 Carelink Summary Report / Loop Recorder

## 2023-09-28 ENCOUNTER — Telehealth: Payer: Self-pay

## 2023-09-28 NOTE — Telephone Encounter (Signed)
 Called patient to ask about the last day he took his Plavix  and got no answer. Left vm for patient to call back.

## 2023-09-28 NOTE — Telephone Encounter (Signed)
 PT returned call. He has not taken his Plavix  since Thursday.

## 2023-09-30 ENCOUNTER — Ambulatory Visit (AMBULATORY_SURGERY_CENTER): Admitting: Internal Medicine

## 2023-09-30 ENCOUNTER — Encounter: Payer: Self-pay | Admitting: Internal Medicine

## 2023-09-30 VITALS — BP 110/52 | HR 71 | Temp 97.9°F | Resp 23 | Ht 73.0 in | Wt 191.0 lb

## 2023-09-30 DIAGNOSIS — Z1211 Encounter for screening for malignant neoplasm of colon: Secondary | ICD-10-CM | POA: Diagnosis not present

## 2023-09-30 DIAGNOSIS — K648 Other hemorrhoids: Secondary | ICD-10-CM

## 2023-09-30 DIAGNOSIS — D120 Benign neoplasm of cecum: Secondary | ICD-10-CM

## 2023-09-30 DIAGNOSIS — K635 Polyp of colon: Secondary | ICD-10-CM

## 2023-09-30 DIAGNOSIS — D122 Benign neoplasm of ascending colon: Secondary | ICD-10-CM | POA: Diagnosis not present

## 2023-09-30 MED ORDER — SODIUM CHLORIDE 0.9 % IV SOLN: 500.0000 mL | Freq: Once | INTRAVENOUS | Status: DC

## 2023-09-30 NOTE — Op Note (Addendum)
 [facility] Patient Name: Jeffery Willis Procedure Date: 09/30/2023 2:23 PM MRN: [id number] Endoscopist: Freada Jacobs Drasco , , [id number] Age: 68 Referring MD:  Date of Birth: 07-27-1955 Gender: Male Account #: [id number] Procedure:                Colonoscopy Indications:              Screening for colorectal malignant neoplasm Medicines:                Monitored Anesthesia Care Procedure:                Pre-Anesthesia Assessment:                           - Prior to the procedure, a History and Physical                            was performed, and patient medications and                            allergies were reviewed. The patient's tolerance of                            previous anesthesia was also reviewed. The risks                            and benefits of the procedure and the sedation                            options and risks were discussed with the patient.                            All questions were answered, and informed consent                            was obtained. Prior Anticoagulants: The patient has                            taken Plavix  (clopidogrel ), last dose was 5 days                            prior to procedure. ASA Grade Assessment: II - A                            patient with mild systemic disease. After reviewing                            the risks and benefits, the patient was deemed in                            satisfactory condition to undergo the procedure.                           After obtaining informed consent, the colonoscope  was passed under direct vision. Throughout the                            procedure, the patient's blood pressure, pulse, and                            oxygen saturations were monitored continuously. The                            CF [id number] #[id number] was introduced through the anus                            and advanced to the the terminal ileum. The                             colonoscopy was performed without difficulty. The                            patient tolerated the procedure well. The quality                            of the bowel preparation was excellent. The                            terminal ileum, ileocecal valve, appendiceal                            orifice, and rectum were photographed. Scope In: 2:32:31 PM Scope Out: 2:50:15 PM Scope Withdrawal Time: 0 hours 14 minutes 32 seconds  Total Procedure Duration: 0 hours 17 minutes 44 seconds  Findings:                 The terminal ileum appeared normal.                           Two sessile polyps were found in the ascending                            colon and cecum. The polyps were 3 to 6 mm in size.                            These polyps were removed with a cold snare.                            Resection and retrieval were complete.                           Non-bleeding internal hemorrhoids were found during                            retroflexion. Complications:            No immediate complications. Estimated Blood Loss:     Estimated blood loss was minimal. Impression:               -  The examined portion of the ileum was normal.                           - Two 3 to 6 mm polyps in the ascending colon and                            in the cecum, removed with a cold snare. Resected                            and retrieved.                           - Non-bleeding internal hemorrhoids. Recommendation:           - Discharge patient to home (with escort).                           - Await pathology results.                           - Okay to restart your Plavix  tomorrow.                           - The findings and recommendations were discussed                            with the patient. Dr [name] "[name]" [place],  09/30/2023 2:53:05 PM

## 2023-09-30 NOTE — Progress Notes (Signed)
 Called to room to assist during endoscopic procedure.  Patient ID and intended procedure confirmed with present staff. Received instructions for my participation in the procedure from the performing physician.

## 2023-09-30 NOTE — Progress Notes (Signed)
 Vss nad trans to pacu

## 2023-09-30 NOTE — Patient Instructions (Signed)

## 2023-09-30 NOTE — Progress Notes (Signed)
 GASTROENTEROLOGY PROCEDURE H&P NOTE   Primary Care Physician: Donnie Galea, MD    Reason for Procedure:   Colon cancer screening  Plan:    Colonoscopy  Patient is appropriate for endoscopic procedure(s) in the ambulatory ([facility]) setting.  The nature of the procedure, as well as the risks, benefits, and alternatives were carefully and thoroughly reviewed with the patient. Ample time for discussion and questions allowed. The patient understood, was satisfied, and agreed to proceed.     HPI: Jeffery Willis is a 68 y.o. male who presents for colonoscopy for evaluation of colon cancer screening.  Patient was most recently seen in the Gastroenterology Clinic on 08/06/23.  No interval change in medical history since that appointment. Please refer to that note for full details regarding GI history and clinical presentation.   Past Medical History:  Diagnosis Date   Achilles tendonitis 10/08/2017   Calculus of kidney 02/14/2010   Cerebrovascular accident 02/04/2020   L PCA stroke with hippocampal involvement   Generalized anxiety disorder    High blood pressure    History of cardiovascular stress test 06/2005   normal myocardial perfusion. Given these results and the patient's persistence of flipped T waves, I think these are likely asymmptomatic and clinically insignificant findings   Kidney stones    Mild vascular neurocognitive disorder 11/15/2020   Skin lesion    Stroke (cerebrum) [facility])     Past Surgical History:  Procedure Laterality Date   2 D Echo  06/2005   normal LV function and no LVH.  Normal 2D echowith ER60 to 70%   BUBBLE STUDY  02/07/2020   Procedure: BUBBLE STUDY;  Surgeon: Maudine Sos, MD;  Location: [facility] ENDOSCOPY;  Service: Cardiovascular;;   KIDNEY STONE SURGERY     LOOP RECORDER INSERTION N/A 02/07/2020   Procedure: LOOP RECORDER INSERTION;  Surgeon: Jolly Needle, MD;  Location: [facility] INVASIVE CV LAB;  Service: Cardiovascular;  Laterality: N/A;   TEE  WITHOUT CARDIOVERSION N/A 02/07/2020   Procedure: TRANSESOPHAGEAL ECHOCARDIOGRAM (TEE);  Surgeon: Maudine Sos, MD;  Location: [facility] ENDOSCOPY;  Service: Cardiovascular;  Laterality: N/A;    Prior to Admission medications   Medication Sig Start Date End Date Taking? Authorizing Provider  clopidogrel  (PLAVIX ) 75 MG tablet Take 75 mg by mouth daily. 06/22/20   [provider]  metoprolol  succinate (TOPROL  XL) 50 MG 24 hr tablet Take 1 tablet (50 mg total) by mouth daily. 02/05/21   Tylene Galla, PA-C  Multiple Vitamin (MULTIVITAMIN) tablet Take 1 tablet by mouth daily.    [provider]  pravastatin  (PRAVACHOL ) 10 MG tablet Take 1 tablet (10 mg total) by mouth daily. 05/26/23   Donnie Galea, MD    Current Outpatient Medications  Medication Sig Dispense Refill   metoprolol  succinate (TOPROL  XL) 50 MG 24 hr tablet Take 1 tablet (50 mg total) by mouth daily. 90 tablet 3   Multiple Vitamin (MULTIVITAMIN) tablet Take 1 tablet by mouth daily.     pravastatin  (PRAVACHOL ) 10 MG tablet Take 1 tablet (10 mg total) by mouth daily. 90 tablet 3   clopidogrel  (PLAVIX ) 75 MG tablet Take 75 mg by mouth daily.     Current Facility-Administered Medications  Medication Dose Route Frequency Provider Last Rate Last Admin   0.9 %  sodium chloride  infusion  500 mL Intravenous Once Srijan Givan C, MD        Allergies as of 09/30/2023 - Review Complete 09/30/2023  Allergen Reaction Noted   Lipitor [atorvastatin ] Other (  See Comments) 12/04/2022    Family History  Problem Relation Age of Onset   Heart disease Mother        arrhythmia   Stroke Mother    Hypertension Father        mild   Arthritis Father    Memory loss Maternal Grandmother        possible undiagnosed Alzheimer's disease   Prostate cancer Paternal Grandfather    Colon cancer Neg Hx    Liver disease Neg Hx    Esophageal cancer Neg Hx     Social History   Socioeconomic History   Marital status: Married     Spouse name: Not on file   Number of children: 2   Years of education: 16   Highest education level: Bachelor's degree (e.g., BA, AB, BS)  Occupational History   Occupation: Teacher, 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     Social Drivers of Corporate investment banker Strain: Low Risk  (12/02/2022)   Overall Financial Resource Strain (CARDIA)    Difficulty of Paying Living Expenses: Not hard at all  Food Insecurity: No Food Insecurity (12/02/2022)   Hunger Vital Sign    Worried About Running Out of Food in the Last Year: Never true    Ran Out of Food in the Last Year: Never true  Transportation Needs: No Transportation Needs (12/02/2022)   PRAPARE - Administrator, Civil Service (Medical): No    Lack of Transportation (Non-Medical): No  Physical Activity: Sufficiently Active (12/02/2022)   Exercise Vital Sign    Days of Exercise per Week: 3 days    Minutes of Exercise per Session: 50 min  Stress: Stress Concern Present (12/02/2022)   [employer] of Occupational Health - Occupational Stress Questionnaire    Feeling of Stress : To some extent  Social Connections: Socially Integrated (12/02/2022)   Social Connection and Isolation Panel [NHANES]    Frequency of Communication with Friends and Family: More than three times a week    Frequency of Social Gatherings with Friends and Family: Twice a week    Attends Religious Services: More than 4 times per year    Active Member of [employer] or Organizations: Yes    Attends Engineer, structural: More than 4 times per  year    Marital Status: Married  Catering manager Violence: Not on file    Physical Exam: Vital signs in last 24 hours: BP 136/65   Pulse 60   Temp 97.9 F (36.6 C) (Temporal)   Ht 6\' 1"  (1.854 m)   Wt 191 lb (86.6 kg)   SpO2 98%   BMI 25.20 kg/m  GEN: NAD EYE: Sclerae anicteric ENT: MMM CV: Non-tachycardic Pulm: No increased WOB GI: Soft NEURO:  Alert & Oriented   Regino Caprio, MD [facility]   09/30/2023 2:21 PM

## 2023-10-01 ENCOUNTER — Telehealth: Payer: Self-pay

## 2023-10-01 NOTE — Telephone Encounter (Signed)
 Left message

## 2023-10-06 ENCOUNTER — Ambulatory Visit: Payer: Self-pay | Admitting: Internal Medicine

## 2023-10-06 LAB — SURGICAL PATHOLOGY

## 2023-10-15 ENCOUNTER — Ambulatory Visit (INDEPENDENT_AMBULATORY_CARE_PROVIDER_SITE_OTHER): Payer: Self-pay

## 2023-10-15 DIAGNOSIS — I639 Cerebral infarction, unspecified: Secondary | ICD-10-CM

## 2023-10-15 LAB — CUP PACEART REMOTE DEVICE CHECK
Date Time Interrogation Session: 20250605000213
Implantable Pulse Generator Implant Date: 20210928

## 2023-10-17 ENCOUNTER — Ambulatory Visit: Payer: Self-pay | Admitting: Cardiology

## 2023-11-02 NOTE — Progress Notes (Signed)
 Carelink Summary Report / Loop Recorder

## 2023-11-16 ENCOUNTER — Ambulatory Visit (INDEPENDENT_AMBULATORY_CARE_PROVIDER_SITE_OTHER): Payer: Self-pay

## 2023-11-16 DIAGNOSIS — I639 Cerebral infarction, unspecified: Secondary | ICD-10-CM

## 2023-11-17 LAB — CUP PACEART REMOTE DEVICE CHECK
Date Time Interrogation Session: 20250707000237
Implantable Pulse Generator Implant Date: 20210928

## 2023-11-21 ENCOUNTER — Ambulatory Visit: Payer: Self-pay | Admitting: Cardiology

## 2023-12-01 NOTE — Progress Notes (Signed)
 Carelink Summary Report / Loop Recorder

## 2023-12-17 ENCOUNTER — Ambulatory Visit (INDEPENDENT_AMBULATORY_CARE_PROVIDER_SITE_OTHER): Payer: Self-pay

## 2023-12-17 DIAGNOSIS — I639 Cerebral infarction, unspecified: Secondary | ICD-10-CM | POA: Diagnosis not present

## 2023-12-17 LAB — CUP PACEART REMOTE DEVICE CHECK
Date Time Interrogation Session: 20250807000258
Implantable Pulse Generator Implant Date: 20210928

## 2023-12-18 ENCOUNTER — Ambulatory Visit: Payer: Self-pay | Admitting: Cardiology

## 2024-01-12 ENCOUNTER — Ambulatory Visit (INDEPENDENT_AMBULATORY_CARE_PROVIDER_SITE_OTHER): Admitting: Family Medicine

## 2024-01-12 VITALS — BP 122/80 | HR 65 | Temp 97.2°F | Ht 73.0 in | Wt 183.0 lb

## 2024-01-12 DIAGNOSIS — Z8673 Personal history of transient ischemic attack (TIA), and cerebral infarction without residual deficits: Secondary | ICD-10-CM | POA: Diagnosis not present

## 2024-01-12 DIAGNOSIS — L821 Other seborrheic keratosis: Secondary | ICD-10-CM

## 2024-01-12 NOTE — Progress Notes (Unsigned)
 Lesion neat the L temple and a small one near the R temple.  Both present for a few weeks, possibly longer.  No trauma.  Not sore locally.  We talked about most recent ILR data.  No syncope.  Ran 2 miles today and did well with that.   Meds, vitals, and allergies reviewed.   ROS: Per HPI unless specifically indicated in ROS section   Nad Ncat Neck supple, no LA Rrr Ctab 3 benign appearing fleshy brown SKs noted on the face, 2 near the left temporal area and 1 near the right.  The 2 on the left side are both 8 x 5 mm.  The 1 on the right is 4 x 5 mm.  None are ulcerated or have alarming characteristics.

## 2024-01-12 NOTE — Patient Instructions (Signed)
 It looks like seborrheic keratoses on the skin.  I wouldn't do anything unless they are bothering you.  Take care.  Glad to see you.

## 2024-01-13 DIAGNOSIS — L821 Other seborrheic keratosis: Secondary | ICD-10-CM | POA: Insufficient documentation

## 2024-01-13 NOTE — Assessment & Plan Note (Signed)
 History of. We talked about most recent ILR data.  No syncope.  Ran 2 miles today and did well with that.  He has an unremarkable cardiac exam today and I would continue as is.

## 2024-01-13 NOTE — Assessment & Plan Note (Signed)
 These look like benign appearing lesions and I would not intervene unless they are bothering the patient.  He can update me as needed.

## 2024-01-18 ENCOUNTER — Ambulatory Visit (INDEPENDENT_AMBULATORY_CARE_PROVIDER_SITE_OTHER): Payer: Self-pay

## 2024-01-18 DIAGNOSIS — I639 Cerebral infarction, unspecified: Secondary | ICD-10-CM

## 2024-01-18 LAB — CUP PACEART REMOTE DEVICE CHECK
Date Time Interrogation Session: 20250907000614
Implantable Pulse Generator Implant Date: 20210928

## 2024-01-24 ENCOUNTER — Ambulatory Visit: Payer: Self-pay | Admitting: Cardiology

## 2024-01-28 NOTE — Progress Notes (Signed)
 Remote Loop Recorder Transmission

## 2024-02-05 NOTE — Progress Notes (Signed)
 Remote Loop Recorder Transmission

## 2024-02-12 ENCOUNTER — Ambulatory Visit: Admitting: Family Medicine

## 2024-02-12 ENCOUNTER — Ambulatory Visit (INDEPENDENT_AMBULATORY_CARE_PROVIDER_SITE_OTHER)
Admission: RE | Admit: 2024-02-12 | Discharge: 2024-02-12 | Disposition: A | Discharge status: Home / Self Care | Source: Ambulatory Visit | Attending: Family Medicine | Admitting: Family Medicine

## 2024-02-12 ENCOUNTER — Encounter: Payer: Self-pay | Admitting: Family Medicine

## 2024-02-12 VITALS — BP 136/78 | HR 62 | Temp 97.9°F | Ht 73.0 in | Wt 181.8 lb

## 2024-02-12 DIAGNOSIS — M25562 Pain in left knee: Secondary | ICD-10-CM | POA: Diagnosis not present

## 2024-02-12 NOTE — Patient Instructions (Signed)
 Try using voltaren gel on your knee and keep icing for 5 minutes at a time.  Let me know if you can't get set up with PT.  Xray on the way out.  Take care.  Glad to see you.

## 2024-02-12 NOTE — Progress Notes (Signed)
 H/o L lateral knee pain, prev would resolve, this episode hasn't gotten better as quick as normal.  Pain with bending, pain with getting up to stand.  Pain going down stairs.  He cut back on running in the meantime. No R knee pain.  This episode started about 2-3 weeks ago.  No clear trigger for the event.  Tried icing.  Hasn't tried voltaren gel.  Tried oral ibuprofen.    Meds, vitals, and allergies reviewed.   ROS: Per HPI unless specifically indicated in ROS section   Nad Ncat L knee not ttp at rest, ie joint line not ttp.  L knee pain with full extension and flexion.  L ITB tight and sore on testing.   Quad not tender. Able to bear weight. Patella nontender. No bruising or erythema.

## 2024-02-14 ENCOUNTER — Telehealth: Payer: Self-pay | Admitting: Family Medicine

## 2024-02-14 DIAGNOSIS — M25562 Pain in left knee: Secondary | ICD-10-CM | POA: Insufficient documentation

## 2024-02-14 NOTE — Telephone Encounter (Signed)
 Please check with patient.  I am awaiting the overread from radiology.  I looked at his films.  It looks like he has mild degenerative changes in his knee.  Let me know if he does not improve with icing, Voltaren gel, PT or if he is getting worse in the meantime.  Thanks.

## 2024-02-14 NOTE — Assessment & Plan Note (Signed)
 He has ITB findings.  Discussed PT for that.  Unclear if he has an intrinsic issue in the knee itself limiting flexion and extension.  I think it makes sense to check plain films today. He can try using voltaren gel on his knee and keep icing for 5 minutes at a time.  He can let me know if he does not get set up with PT. If his knee films are unremarkable and if he is not improving we can consider advanced imaging of the knee versus referral at that point.

## 2024-02-19 ENCOUNTER — Ambulatory Visit (INDEPENDENT_AMBULATORY_CARE_PROVIDER_SITE_OTHER): Payer: Self-pay

## 2024-02-19 DIAGNOSIS — I639 Cerebral infarction, unspecified: Secondary | ICD-10-CM

## 2024-02-19 LAB — CUP PACEART REMOTE DEVICE CHECK
Date Time Interrogation Session: 20251010000216
Implantable Pulse Generator Implant Date: 20210928

## 2024-02-21 ENCOUNTER — Ambulatory Visit: Payer: Self-pay | Admitting: Family Medicine

## 2024-02-22 ENCOUNTER — Ambulatory Visit: Payer: Self-pay | Admitting: Cardiology

## 2024-02-22 NOTE — Progress Notes (Signed)
 Remote Loop Recorder Transmission

## 2024-03-14 ENCOUNTER — Telehealth: Payer: Self-pay | Admitting: Neurology

## 2024-03-14 NOTE — Telephone Encounter (Signed)
 LVM and sent mychart msg informing pt of need to reschedule 05/02/24 appt - MD out

## 2024-03-21 ENCOUNTER — Ambulatory Visit (INDEPENDENT_AMBULATORY_CARE_PROVIDER_SITE_OTHER): Payer: Self-pay

## 2024-03-21 DIAGNOSIS — I639 Cerebral infarction, unspecified: Secondary | ICD-10-CM

## 2024-03-21 LAB — CUP PACEART REMOTE DEVICE CHECK
Date Time Interrogation Session: 20251110000232
Implantable Pulse Generator Implant Date: 20210928

## 2024-03-23 ENCOUNTER — Ambulatory Visit: Payer: Self-pay | Admitting: Cardiology

## 2024-03-25 NOTE — Progress Notes (Signed)
 Remote Loop Recorder Transmission

## 2024-04-15 ENCOUNTER — Encounter: Payer: Self-pay | Admitting: *Deleted

## 2024-04-21 ENCOUNTER — Ambulatory Visit: Payer: Self-pay

## 2024-04-21 DIAGNOSIS — I639 Cerebral infarction, unspecified: Secondary | ICD-10-CM

## 2024-04-21 LAB — CUP PACEART REMOTE DEVICE CHECK
Date Time Interrogation Session: 20251211000245
Implantable Pulse Generator Implant Date: 20210928

## 2024-04-24 ENCOUNTER — Ambulatory Visit: Payer: Self-pay | Admitting: Cardiology

## 2024-04-28 NOTE — Progress Notes (Signed)
 Remote Loop Recorder Transmission

## 2024-05-02 ENCOUNTER — Ambulatory Visit: Admitting: Neurology

## 2024-05-16 ENCOUNTER — Ambulatory Visit (INDEPENDENT_AMBULATORY_CARE_PROVIDER_SITE_OTHER)

## 2024-05-16 VITALS — Ht 73.0 in | Wt 181.0 lb

## 2024-05-16 DIAGNOSIS — Z0000 Encounter for general adult medical examination without abnormal findings: Secondary | ICD-10-CM | POA: Diagnosis not present

## 2024-05-16 NOTE — Progress Notes (Signed)
 "  Chief Complaint  Patient presents with   Medicare Wellness     Subjective:   Jeffery Willis is a 69 y.o. male who presents for a Medicare Annual Wellness Visit.  Visit info / Clinical Intake: Medicare Wellness Visit Type:: Initial Annual Wellness Visit Persons participating in visit and providing information:: patient Medicare Wellness Visit Mode:: Telephone If telephone:: video declined Since this visit was completed virtually, some vitals may be partially provided or unavailable. Missing vitals are due to the limitations of the virtual format.: Documented vitals are patient reported If Telephone or Video please confirm:: I connected with patient using audio/video enable telemedicine. I verified patient identity with two identifiers, discussed telehealth limitations, and patient agreed to proceed. Patient Location:: Home Provider Location:: Office Interpreter Needed?: No Pre-visit prep was completed: yes AWV questionnaire completed by patient prior to visit?: no Living arrangements:: lives with spouse/significant other Patient's Overall Health Status Rating: good Typical amount of pain: none Does pain affect daily life?: no Are you currently prescribed opioids?: no  Dietary Habits and Nutritional Risks How many meals a day?: 2 (2-3) Eats fruit and vegetables daily?: yes Most meals are obtained by: preparing own meals; eating out; having others provide food In the last 2 weeks, have you had any of the following?: none Diabetic:: no  Functional Status Activities of Daily Living (to include ambulation/medication): Independent Ambulation: Independent with device- listed below Home Assistive Devices/Equipment: Eyeglasses Medication Administration: Independent Home Management (perform basic housework or laundry): Independent Manage your own finances?: yes Primary transportation is: driving Concerns about vision?: no *vision screening is required for WTM* Concerns about  hearing?: no  Fall Screening Falls in the past year?: 0 Number of falls in past year: 0 Was there an injury with Fall?: 0 Fall Risk Category Calculator: 0 Patient Fall Risk Level: Low Fall Risk  Fall Risk Patient at Risk for Falls Due to: No Fall Risks Fall risk Follow up: Falls evaluation completed; Falls prevention discussed  Home and Transportation Safety: All rugs have non-skid backing?: N/A, no rugs All stairs or steps have railings?: yes Grab bars in the bathtub or shower?: (!) no Have non-skid surface in bathtub or shower?: yes Good home lighting?: yes Regular seat belt use?: yes Hospital stays in the last year:: no  Cognitive Assessment Difficulty concentrating, remembering, or making decisions? : no Will 6CIT or Mini Cog be Completed: yes What year is it?: 0 points What month is it?: 0 points Give patient an address phrase to remember (5 components): [address] [place] About what time is it?: 0 points Count backwards from 20 to 1: 0 points Say the months of the year in reverse: 0 points Repeat the address phrase from earlier: 0 points 6 CIT Score: 0 points  Advance Directives (For Healthcare) Does Patient Have a Medical Advance Directive?: No Would patient like information on creating a medical advance directive?: No - Patient declined  Reviewed/Updated  Reviewed/Updated: Reviewed All (Medical, Surgical, Family, Medications, Allergies, Care Teams, Patient Goals)    Allergies (verified) Lipitor [atorvastatin ]   Current Medications (verified) Outpatient Encounter Medications as of 05/16/2024  Medication Sig   clopidogrel  (PLAVIX ) 75 MG tablet Take 75 mg by mouth daily.   metoprolol  succinate (TOPROL  XL) 50 MG 24 hr tablet Take 1 tablet (50 mg total) by mouth daily.   Multiple Vitamin (MULTIVITAMIN) tablet Take 1 tablet by mouth daily.   pravastatin  (PRAVACHOL ) 10 MG tablet Take 1 tablet (10 mg total) by mouth daily.  No facility-administered  encounter medications on file as of 05/16/2024.    History: Past Medical History:  Diagnosis Date   Achilles tendonitis 10/08/2017   Calculus of kidney 02/14/2010   Cerebrovascular accident 02/04/2020   L PCA stroke with hippocampal involvement   Generalized anxiety disorder    High blood pressure    History of cardiovascular stress test 06/2005   normal myocardial perfusion. Given these results and the patient's persistence of flipped T waves, I think these are likely asymmptomatic and clinically insignificant findings   Kidney stones    Mild vascular neurocognitive disorder 11/15/2020   Skin lesion    Stroke (cerebrum) [facility])    Past Surgical History:  Procedure Laterality Date   2 D Echo  06/2005   normal LV function and no LVH.  Normal 2D echowith ER60 to 70%   BUBBLE STUDY  02/07/2020   Procedure: BUBBLE STUDY;  Surgeon: Raford Riggs, MD;  Location: [facility] ENDOSCOPY;  Service: Cardiovascular;;   KIDNEY STONE SURGERY     LOOP RECORDER INSERTION N/A 02/07/2020   Procedure: LOOP RECORDER INSERTION;  Surgeon: Kelsie Agent, MD;  Location: [facility] INVASIVE CV LAB;  Service: Cardiovascular;  Laterality: N/A;   TEE WITHOUT CARDIOVERSION N/A 02/07/2020   Procedure: TRANSESOPHAGEAL ECHOCARDIOGRAM (TEE);  Surgeon: Raford Riggs, MD;  Location: [facility] ENDOSCOPY;  Service: Cardiovascular;  Laterality: N/A;   Family History  Problem Relation Age of Onset   Heart disease Mother        arrhythmia   Stroke Mother    Hypertension Father        mild   Arthritis Father    Memory loss Maternal Grandmother        possible undiagnosed Alzheimer's disease   Prostate cancer Paternal Grandfather    Colon cancer Neg Hx    Liver disease Neg Hx    Esophageal cancer Neg Hx    Social History   Occupational History   Occupation: Teacher, 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[employer]   Occupation: retired  Tobacco Use   Smoking status: Never   Smokeless tobacco: Never  Vaping Use   Vaping status: Never  Used  Substance and Sexual Activity   Alcohol use: No   Drug use: No   Sexual activity: Yes   Tobacco Counseling Counseling given: No  SDOH Screenings   Food Insecurity: No Food Insecurity (05/16/2024)  Housing: Unknown (05/16/2024)  Transportation Needs: No Transportation Needs (05/16/2024)  Utilities: Not At Risk (05/16/2024)  Alcohol Screen: Low Risk (12/02/2022)  Depression (PHQ2-9): Low Risk (05/16/2024)  Financial Resource Strain: Low Risk (01/12/2024)  Physical Activity: Sufficiently Active (05/16/2024)  Social Connections: Moderately Isolated (05/16/2024)  Stress: No Stress Concern Present (05/16/2024)  Tobacco Use: Low Risk (05/16/2024)  Health Literacy: Adequate Health Literacy (05/16/2024)   See flowsheets for full screening details  Depression Screen PHQ 2 & 9 Depression Scale- Over the past 2 weeks, how often have you been bothered by any of the following problems? Little interest or pleasure in doing things: 0 Feeling down, depressed, or hopeless (PHQ Adolescent also includes...irritable): 0 PHQ-2 Total Score: 0 Trouble falling or staying asleep, or sleeping too much: 0 Feeling tired or having little energy: 0 Poor appetite or overeating (PHQ Adolescent also includes...weight loss): 0 Feeling bad about yourself - or that you are a failure or have let yourself or your family down: 0 Trouble concentrating on things, such as reading the newspaper or watching television (PHQ Adolescent also includes...like school work): 0 Moving or speaking  so slowly that other people could have noticed. Or the opposite - being so fidgety or restless that you have been moving around a lot more than usual: 0 Thoughts that you would be better off dead, or of hurting yourself in some way: 0 PHQ-9 Total Score: 0 If you checked off any problems, how difficult have these problems made it for you to do your work, take care of things at home, or get along with other people?: Not difficult at all  Depression  Treatment Depression Interventions/Treatment : EYV7-0 Score <4 Follow-up Not Indicated     Goals Addressed               This Visit's Progress     Patient Stated (pt-stated)        Patient stated he plans to continue exercising 3x a week at the gym             Objective:    Today's Vitals   05/16/24 1245  Weight: 181 lb (82.1 kg)  Height: 6' 1 (1.854 m)   Body mass index is 23.88 kg/m.  Hearing/Vision screen Hearing Screening - Comments:: Denies hearing difficulties   Vision Screening - Comments:: Wears rx glasses - up to date with routine eye exams with an Optomerist in [place], [place] Immunizations and Health Maintenance Health Maintenance  Topic Date Due   Zoster Vaccines- Shingrix (1 of 2) Never done   Pneumococcal Vaccine: 50+ Years (1 of 1 - PCV) Never done   DTaP/Tdap/Td (2 - Td or Tdap) 03/03/2021   Influenza Vaccine  08/09/2024 (Originally 12/11/2023)   Medicare Annual Wellness (AWV)  05/16/2025   Colonoscopy  09/30/2030   Hepatitis C Screening  Completed   Meningococcal B Vaccine  Aged Out   Hepatitis B Vaccines 19-59 Average Risk  Discontinued   COVID-19 Vaccine  Discontinued        Assessment/Plan:  This is a routine wellness examination for [facility].  Patient Care Team: Cleatus Arlyss RAMAN, MD as PCP - Diedre Kelsie Agent, MD (Inactive) as PCP - Electrophysiology (Cardiology) Kelsie Agent, MD (Inactive) as Consulting Physician (Cardiology)  I have personally reviewed and noted the following in the patients chart:   Medical and social history Use of alcohol, tobacco or illicit drugs  Current medications and supplements including opioid prescriptions. Functional ability and status Nutritional status Physical activity Advanced directives List of other physicians Hospitalizations, surgeries, and ER visits in previous 12 months Vitals Screenings to include cognitive, depression, and falls Referrals and appointments  No orders of the  defined types were placed in this encounter.  In addition, I have reviewed and discussed with patient certain preventive protocols, quality metrics, and best practice recommendations. A written personalized care plan for preventive services as well as general preventive health recommendations were provided to patient.   Verdie CHRISTELLA Saba, CMA   05/16/2024   Return in 1 year (on 05/16/2025).  After Visit Summary: (MyChart) Due to this being a telephonic visit, the after visit summary with patients personalized plan was offered to patient via MyChart   Nurse Notes: scheduled 6-mth f/u appt w/PCP for 08/2024. "

## 2024-05-16 NOTE — Patient Instructions (Addendum)
 Jeffery Willis,  Thank you for taking the time for your Medicare Wellness Visit. I appreciate your continued commitment to your health goals. Please review the care plan we discussed, and feel free to reach out if I can assist you further.  Please note that Annual Wellness Visits do not include a physical exam. Some assessments may be limited, especially if the visit was conducted virtually. If needed, we may recommend an in-person follow-up with your provider.  Ongoing Care Seeing your primary care provider every 3 to 6 months helps us  monitor your health and provide consistent, personalized care.   Referrals If a referral was made during today's visit and you haven't received any updates within two weeks, please contact the referred provider directly to check on the status.  Recommended Screenings:  Health Maintenance  Topic Date Due   Zoster (Shingles) Vaccine (1 of 2) Never done   Pneumococcal Vaccine for age over 64 (1 of 1 - PCV) Never done   DTaP/Tdap/Td vaccine (2 - Td or Tdap) 03/03/2021   Flu Shot  08/09/2024*   Medicare Annual Wellness Visit  05/16/2025   Colon Cancer Screening  09/30/2030   Hepatitis C Screening  Completed   Meningitis B Vaccine  Aged Out   Hepatitis B Vaccine  Discontinued   COVID-19 Vaccine  Discontinued  *Topic was postponed. The date shown is not the original due date.       05/16/2024   12:47 PM  Advanced Directives  Does Patient Have a Medical Advance Directive? No  Would patient like information on creating a medical advance directive? No - Patient declined    Vision: Annual vision screenings are recommended for early detection of glaucoma, cataracts, and diabetic retinopathy. These exams can also reveal signs of chronic conditions such as diabetes and high blood pressure.  Dental: Annual dental screenings help detect early signs of oral cancer, gum disease, and other conditions linked to overall health, including heart disease and diabetes.

## 2024-05-18 ENCOUNTER — Other Ambulatory Visit: Payer: Self-pay | Admitting: Neurology

## 2024-05-18 ENCOUNTER — Encounter: Payer: Self-pay | Admitting: Neurology

## 2024-05-18 ENCOUNTER — Ambulatory Visit (INDEPENDENT_AMBULATORY_CARE_PROVIDER_SITE_OTHER): Admitting: Neurology

## 2024-05-18 VITALS — BP 133/76 | HR 56 | Ht 73.0 in | Wt 186.6 lb

## 2024-05-18 DIAGNOSIS — Z8673 Personal history of transient ischemic attack (TIA), and cerebral infarction without residual deficits: Secondary | ICD-10-CM | POA: Diagnosis not present

## 2024-05-18 DIAGNOSIS — I6781 Acute cerebrovascular insufficiency: Secondary | ICD-10-CM | POA: Diagnosis not present

## 2024-05-18 DIAGNOSIS — I2585 Chronic coronary microvascular dysfunction: Secondary | ICD-10-CM | POA: Diagnosis not present

## 2024-05-18 DIAGNOSIS — G3184 Mild cognitive impairment, so stated: Secondary | ICD-10-CM

## 2024-05-18 DIAGNOSIS — I619 Nontraumatic intracerebral hemorrhage, unspecified: Secondary | ICD-10-CM

## 2024-05-18 NOTE — Patient Instructions (Signed)
 I had a long d/w patient about his remote stroke and mild cognitive impairment, risk for recurrent stroke/TIAs, personally independently reviewed imaging studies and stroke evaluation results and answered questions.Continue Plavix  (clopidogrel )75 mg daily  for secondary stroke prevention and maintain strict control of hypertension with blood pressure goal below 130/90, diabetes with hemoglobin A1c goal below 6.5% and lipids with LDL cholesterol goal below 70 mg/dL. I also advised the patient to eat a healthy diet with plenty of whole grains, cereals, fruits and vegetables, exercise regularly and maintain ideal body weight .I encouraged him to continue participating in cognitively challenging activities like playing bridge, sudoku, Solving Crossword Puzzles.  Check Screening Follow-Up Carotid Ultrasound Study,lipid panel and HbA1c..  Followup in the future with me in 1 year or call earlier if necessary

## 2024-05-18 NOTE — Progress Notes (Signed)
 " [facility] [address] [place]. [place] [address] [phone]       OFFICE FOLLOW UP VISIT NOTE  [name] Date of Birth:  1955-05-16 Medical Record Number:  [id number]   Referring MD: Arlyss Solian  Reason for Referral: Memory loss  HPI: Initial visit 10/23/2020: Jeffery Willis is a pleasant 37 Caucasian male seen today for office consultation visit for memory loss.  History is obtained from the patient and review of electronic medical records and personally reviewed available imaging films in PACS.  He has no significant past medical history except left PCA branch infarct of cryptogenic etiology in September 2021 with practically no significant residual deficits except right superior quadrantic vision loss.  He has noticed subjective memory difficulties for the last few months.  He states he has to at times think where he is going and has to concentrate harder to remember things.  He forgets generic names of medications which he has been prescribing for years as he works as a teacher, early years/pre.  However if he thinks are enough it will come back.  Is still able to work and works to 3 days a week.  He is managing all his affairs independently however his wife is more concerned about his forgetfulness and short-term memory difficulties.  He denies any headaches, loss of consciousness, seizure, significant head injury.  He does have history of left medial temporal infarct in September 2021 of cryptogenic etiology.  He had a loop recorder placed and so far A. fib has not been found.  He had mild residual right upper quadrant vision loss which still persists.  He has some short-term memory difficulties following the stroke but had shown improvement in this recent worsening symptoms new.  He has had no recent brain imaging study done.  He has a referral to neuropsychologist and has an appointment next month.  He has no family history of Alzheimer's or dementia.  His dad had Parkinson's  disease. Update 02/14/2021 : He returns for follow-up after last visit 3 months ago.  He states he is doing well he feels subjectively is short-term memory is actually better though his wife does not think so.  He is tolerating Plavix  well without bruising or bleeding.  His blood pressures well controlled today it is 130/73.  He remains on Lipitor which is tolerating well without side effects however he has had slightly elevated liver enzymes recently and his primary care physician has ordered an MRI scan of the liver which is pending.  Patient has a loop recorder in on last evaluation on 02/11/2021 no evidence of paroxysmal A. fib was found.  At the last visit he had lab work for reversible causes of memory loss and vitamin B12, TSH, RPR and homocystine were all normal.  EEG was ordered but for some reason has not been done.  MRI scan of the brain on 11/08/2020 shows old left deep temporal infarct and no acute abnormality.  He has no new complaints.  Update 02/24/2022 ; he returns for follow-up after last visit on 02/14/2021.  He states he is doing well.  His memory is little bit better.  He plays a lot of chess but does not do any other cognitively challenging activities regular basis.  He does exercise at the [facility].3 days per week.  He also walks 2 miles.-No recurrent stroke or TIA symptoms.  Remains on Plavix  tolerating well without bruising or bleeding.  He states his blood pressure is under good control and today  it is slightly elevated 148/79.  He is tolerating Lipitor 40 mg daily well without muscle aches or pains.  He states his lipid profile was recently checked a few months ago and was satisfactory.  He has no complaints today. Update 05/18/2024 : He returns for follow-up after last visit a year and a half ago.  He states he is doing well.  He continues to have very minor short-term memory difficulties.  Stable and nonprogressive.  He is completely independent in all activities of daily living.  He manages  all his affairs.  He has not had any recurrent TIA or stroke symptoms since 2021.  He remains on Plavix  which is tolerating well with minor bruising and no bleeding.  His blood pressure is under good control today 133/76.  Remains on protocol which is tolerating well without side effects.  Lab work on 05/19/2023 showed LDL cholesterol to be optimal at 69 mg percent..  His loop recorder has so far marked.  Any A-fib. ROS:   14 system review of systems is positive for memory loss, word finding difficulties only all other systems negative forgetfulness,  PMH:  Past Medical History:  Diagnosis Date   Achilles tendonitis 10/08/2017   Calculus of kidney 02/14/2010   Cerebrovascular accident 02/04/2020   L PCA stroke with hippocampal involvement   Generalized anxiety disorder    High blood pressure    History of cardiovascular stress test 06/2005   normal myocardial perfusion. Given these results and the patient's persistence of flipped T waves, I think these are likely asymmptomatic and clinically insignificant findings   Kidney stones    Mild vascular neurocognitive disorder 11/15/2020   Skin lesion    Stroke (cerebrum) [facility])     Social History:  Social History   Socioeconomic History   Marital status: Married    Spouse name: Not on file   Number of children: 2   Years of education: 16   Highest education level: Bachelor's degree (e.g., BA, AB, BS)  Occupational History   Occupation: Teacher, 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[employer]   Occupation: retired  Tobacco Use   Smoking status: Never   Smokeless tobacco: Never  Vaping Use   Vaping status: Never Used  Substance and Sexual Activity   Alcohol use: No   Drug use: No   Sexual activity: Yes  Other Topics Concern   Not on file  Social History Narrative   Lives with wife   Right Handed   Drinks 3-4 cups caffeine daily   Pharmacist- floating at multiple stores.     Social Drivers of Health   Tobacco Use: Low Risk (05/18/2024)    Patient History    Smoking Tobacco Use: Never    Smokeless Tobacco Use: Never    Passive Exposure: Not on file  Financial Resource Strain: Low Risk (01/12/2024)   Overall Financial Resource Strain (CARDIA)    Difficulty of Paying Living Expenses: Not hard at all  Food Insecurity: No Food Insecurity (05/16/2024)   Epic    Worried About Programme Researcher, Broadcasting/film/video in the Last Year: Never true    Ran Out of Food in the Last Year: Never true  Transportation Needs: No Transportation Needs (05/16/2024)   Epic    Lack of Transportation (Medical): No    Lack of Transportation (Non-Medical): No  Physical Activity: Sufficiently Active (05/16/2024)   Exercise Vital Sign    Days of Exercise per Week: 3 days    Minutes of Exercise per  Session: 60 min  Stress: No Stress Concern Present (05/16/2024)   [employer] of Occupational Health - Occupational Stress Questionnaire    Feeling of Stress: Not at all  Social Connections: Moderately Isolated (05/16/2024)   Social Connection and Isolation Panel    Frequency of Communication with Friends and Family: More than three times a week    Frequency of Social Gatherings with Friends and Family: Once a week    Attends Religious Services: Never    Database Administrator or Organizations: No    Attends Banker Meetings: Never    Marital Status: Married  Catering Manager Violence: Not At Risk (05/16/2024)   Epic    Fear of Current or Ex-Partner: No    Emotionally Abused: No    Physically Abused: No    Sexually Abused: No  Depression (PHQ2-9): Low Risk (05/16/2024)   Depression (PHQ2-9)    PHQ-2 Score: 0  Alcohol Screen: Low Risk (12/02/2022)   Alcohol Screen    Last Alcohol Screening Score (AUDIT): 1  Housing: Unknown (05/16/2024)   Epic    Unable to Pay for Housing in the Last Year: No    Number of Times Moved in the Last Year: Not on file    Homeless in the Last Year: No  Utilities: Not At Risk (05/16/2024)   Epic    Threatened with loss of  utilities: No  Health Literacy: Adequate Health Literacy (05/16/2024)   B1300 Health Literacy    Frequency of need for help with medical instructions: Never    Medications:   Current Outpatient Medications on File Prior to Visit  Medication Sig Dispense Refill   clopidogrel  (PLAVIX ) 75 MG tablet Take 75 mg by mouth daily.     metoprolol  succinate (TOPROL  XL) 50 MG 24 hr tablet Take 1 tablet (50 mg total) by mouth daily. 90 tablet 3   Multiple Vitamin (MULTIVITAMIN) tablet Take 1 tablet by mouth daily.     pravastatin  (PRAVACHOL ) 10 MG tablet Take 1 tablet (10 mg total) by mouth daily. 90 tablet 3   No current facility-administered medications on file prior to visit.    Allergies:   Allergies  Allergen Reactions   Lipitor [Atorvastatin ] Other (See Comments)    Muscle discomfort per pt    Physical Exam General: well developed, well nourished, pleasant middle-age Caucasian male seated, in no evident distress Head: head normocephalic and atraumatic.   Neck: supple with no carotid or supraclavicular bruits Cardiovascular: regular rate and rhythm, no murmurs Musculoskeletal: no deformity Skin:  no rash/petichiae Vascular:  Normal pulses all extremities  Neurologic Exam Mental Status: Awake and fully alert. Oriented to place and time. Recent and remote memory intact. Attention span, concentration and fund of knowledge appropriate. Mood and affect appropriate.  MMSE not done today.  Recall 2/3.SABRA  He was able to name 12 animals which can walk on 4 legs.  Clock drawing score was 4/4.   Cranial Nerves: Fundoscopic exam not done. Pupils equal, briskly reactive to light. Extraocular movements full without nystagmus. Visual fields show partial right superior quadrantanopsia to confrontation. Hearing intact. Facial sensation intact. Face, tongue, palate moves normally and symmetrically.  Motor: Normal bulk and tone. Normal strength in all tested extremity muscles. Sensory.: intact to touch ,  pinprick , position and vibratory sensation.  Coordination: Rapid alternating movements normal in all extremities. Finger-to-nose and heel-to-shin performed accurately bilaterally. Gait and Station: Arises from chair without difficulty. Stance is normal. Gait demonstrates normal stride length and balance .  Able to heel, toe and tandem walk without difficulty.  Reflexes: 1+ and symmetric. Toes downgoing.         No data to display           ASSESSMENT: 69 year old Caucasian male with subacute short-term memory and mild cognitive difficulties likely from mild cognitive impairment.  Remote history of left medial temporal infarct in September 2021 of cryptogenic etiology.  Vascular risk factors of hyperlipidemia hypertension and prior stroke.  He appears stable from neurovascular and cognitive standpoint.   PLANI I had a long d/w patient about his remote stroke and mild cognitive impairment, risk for recurrent stroke/TIAs, personally independently reviewed imaging studies and stroke evaluation results and answered questions.Continue Plavix  (clopidogrel )75 mg daily  for secondary stroke prevention and maintain strict control of hypertension with blood pressure goal below 130/90, diabetes with hemoglobin A1c goal below 6.5% and lipids with LDL cholesterol goal below 70 mg/dL. I also advised the patient to eat a healthy diet with plenty of whole grains, cereals, fruits and vegetables, exercise regularly and maintain ideal body weight .I encouraged him to continue participating in cognitively challenging activities like playing bridge, sudoku, Solving Crossword Puzzles.We also discussed memory compensation startegies.  Check Screening Follow-Up Carotid Ultrasound Study,lipid panel and HbA1c..  Followup in the future with me in 1 year or call earlier if necessary    I personally spent a total of 35 minutes in the care of the patient today including getting/reviewing separately obtained history, performing a  medically appropriate exam/evaluation, counseling and educating, placing orders, referring and communicating with other health care professionals, documenting clinical information in the EHR, independently interpreting results, and coordinating care.        Eather Popp, MD Note: This document was prepared with digital dictation and possible smart phrase technology. Any transcriptional errors that result from this process are unintentional.  "

## 2024-05-19 LAB — LIPID PANEL
Chol/HDL Ratio: 2.4 ratio (ref 0.0–5.0)
Cholesterol, Total: 123 mg/dL (ref 100–199)
HDL: 52 mg/dL
LDL Chol Calc (NIH): 60 mg/dL (ref 0–99)
Triglycerides: 44 mg/dL (ref 0–149)
VLDL Cholesterol Cal: 11 mg/dL (ref 5–40)

## 2024-05-19 LAB — HEMOGLOBIN A1C
Est. average glucose Bld gHb Est-mCnc: 120 mg/dL
Hgb A1c MFr Bld: 5.8 % — ABNORMAL HIGH (ref 4.8–5.6)

## 2024-05-22 ENCOUNTER — Ambulatory Visit: Payer: Self-pay

## 2024-05-22 DIAGNOSIS — I639 Cerebral infarction, unspecified: Secondary | ICD-10-CM | POA: Diagnosis not present

## 2024-05-23 LAB — CUP PACEART REMOTE DEVICE CHECK
Date Time Interrogation Session: 20260111000137
Implantable Pulse Generator Implant Date: 20210928

## 2024-05-24 NOTE — Progress Notes (Signed)
 Remote Loop Recorder Transmission

## 2024-05-26 ENCOUNTER — Ambulatory Visit: Payer: Self-pay | Admitting: Neurology

## 2024-05-27 ENCOUNTER — Telehealth: Payer: Self-pay

## 2024-05-27 ENCOUNTER — Ambulatory Visit: Payer: Self-pay | Admitting: Cardiology

## 2024-05-27 NOTE — Telephone Encounter (Signed)
 Spoke w/ patient regarding AF episodes and disconnected monitor. Patient currently has [facility] Heart App and was previously not running in the background of his phone. Advised patient to open the Heart App and leave running in the background. Verbalized understanding.   Per Dr. Inocencio: Abnormal ILR reviewed. Programmed parameters appropriate. Notable for AF, needs AF clinic, please switch ectopy rejection to less sensitive.   Patient advised of need for F/U w/ AF clinic per Dr. Inocencio. Patient agreeable. Last noted AF episodes on December 17th. Patient unaware of any symptoms during this time and states he may have been working out at [employer].   Will adjust ectopy rejection to less sensitive on ILR as requested per Dr. Inocencio. AT/AF: Ectopy Rejection reprogrammed from Aggressive to Nominal. Device settings have been sent to patient and will reprogram when patient connects to [facility].    Will forward to AF clinic team to F/U as requested.

## 2024-05-31 ENCOUNTER — Encounter (HOSPITAL_COMMUNITY)

## 2024-06-01 ENCOUNTER — Ambulatory Visit (INDEPENDENT_AMBULATORY_CARE_PROVIDER_SITE_OTHER)
Admission: RE | Admit: 2024-06-01 | Discharge: 2024-06-01 | Attending: Physician Assistant | Admitting: Physician Assistant

## 2024-06-01 ENCOUNTER — Ambulatory Visit (HOSPITAL_BASED_OUTPATIENT_CLINIC_OR_DEPARTMENT_OTHER)
Admission: RE | Admit: 2024-06-01 | Discharge: 2024-06-01 | Disposition: A | Discharge status: Home / Self Care | Source: Ambulatory Visit | Attending: Neurology | Admitting: Neurology

## 2024-06-01 VITALS — BP 156/82 | HR 53 | Ht 73.0 in | Wt 186.6 lb

## 2024-06-01 DIAGNOSIS — D6869 Other thrombophilia: Secondary | ICD-10-CM | POA: Insufficient documentation

## 2024-06-01 DIAGNOSIS — I4891 Unspecified atrial fibrillation: Secondary | ICD-10-CM | POA: Insufficient documentation

## 2024-06-01 DIAGNOSIS — I619 Nontraumatic intracerebral hemorrhage, unspecified: Secondary | ICD-10-CM | POA: Diagnosis present

## 2024-06-01 DIAGNOSIS — Z8673 Personal history of transient ischemic attack (TIA), and cerebral infarction without residual deficits: Secondary | ICD-10-CM

## 2024-06-01 DIAGNOSIS — I480 Paroxysmal atrial fibrillation: Secondary | ICD-10-CM | POA: Diagnosis not present

## 2024-06-01 MED ORDER — APIXABAN 5 MG PO TABS: 5.0000 mg | ORAL_TABLET | Freq: Two times a day (BID) | ORAL | 3 refills | Status: AC

## 2024-06-01 NOTE — Progress Notes (Signed)
 "   Primary Care Physician: Cleatus Arlyss RAMAN, MD Primary Cardiologist: None Electrophysiologist: Will Gladis Norton, MD  Referring Physician: Device clinic/Dr Norton Ned Finkbiner is a 69 y.o. male with a history of CVA, VT, atrial fibrillation who presents for follow up in the [facility] Atrial Fibrillation [facility].  The patient was initially diagnosed with atrial fibrillation on ILR which was placed in 2021 for cryptogenic stroke. Remote interrogation 05/22/24 showed 5 very brief episodes of afib lasting around 2 minutes each. Patient was unaware of his arrhythmia.     Patient presents today for follow up for atrial fibrillation. He is in SR today and feels well. No significant snoring or alcohol use.   Today, he denies symptoms of palpitations, chest pain, shortness of breath, orthopnea, PND, lower extremity edema, dizziness, presyncope, syncope, snoring, daytime somnolence, bleeding, or neurologic sequela. The patient is tolerating medications without difficulties and is otherwise without complaint today.    Atrial Fibrillation Risk Factors:  he does not have symptoms or diagnosis of sleep apnea. he does not have a history of rheumatic fever. he does not have a history of alcohol use.   Atrial Fibrillation Management history:  Previous antiarrhythmic drugs: none Previous cardioversions: none Previous ablations: none Anticoagulation history: none  ROS- All systems are reviewed and negative except as per the HPI above.  Past Medical History:  Diagnosis Date   Achilles tendonitis 10/08/2017   Calculus of kidney 02/14/2010   Cerebrovascular accident 02/04/2020   L PCA stroke with hippocampal involvement   Generalized anxiety disorder    High blood pressure    History of cardiovascular stress test 06/2005   normal myocardial perfusion. Given these results and the patient's persistence of flipped T waves, I think these are likely asymmptomatic and clinically insignificant  findings   Kidney stones    Mild vascular neurocognitive disorder 11/15/2020   Skin lesion    Stroke (cerebrum) (HCC)     Current Outpatient Medications  Medication Sig Dispense Refill   apixaban  (ELIQUIS ) 5 MG TABS tablet Take 1 tablet (5 mg total) by mouth 2 (two) times daily. 60 tablet 3   b complex vitamins capsule Take 1 capsule by mouth daily.     Cholecalciferol (VITAMIN D-3 PO) Take 2,500 Units by mouth every morning.     Menaquinone-7 (VITAMIN K2 PO) Take 1 tablet by mouth every morning.     metoprolol  succinate (TOPROL  XL) 50 MG 24 hr tablet Take 1 tablet (50 mg total) by mouth daily. 90 tablet 3   Multiple Vitamin (MULTIVITAMIN) tablet Take 1 tablet by mouth daily.     Multiple Vitamins-Minerals (PRESERVISION AREDS PO) Take 1 tablet by mouth every morning.     pravastatin  (PRAVACHOL ) 10 MG tablet Take 1 tablet (10 mg total) by mouth daily. 90 tablet 3   No current facility-administered medications for this encounter.    Physical Exam: BP (!) 156/82   Pulse (!) 53   Ht 6' 1 (1.854 m)   Wt 84.6 kg   BMI 24.62 kg/m   GEN: Well nourished, well developed in no acute distress CARDIAC: Regular rate and rhythm, no murmurs, rubs, gallops RESPIRATORY:  Clear to auscultation without rales, wheezing or rhonchi  ABDOMEN: Soft, non-tender, non-distended EXTREMITIES:  No edema; No deformity   Wt Readings from Last 3 Encounters:  06/01/24 84.6 kg  05/18/24 84.6 kg  05/16/24 82.1 kg     EKG Interpretation Date/Time:  Wednesday June 01 2024 08:54:58 EST Ventricular Rate:  53  PR Interval:  306 QRS Duration:  88 QT Interval:  458 QTC Calculation: 429 R Axis:   83  Text Interpretation: Sinus bradycardia with 1st degree A-V block Minimal voltage criteria for LVH, may be normal variant ( Sokolow-Lyon ) Nonspecific T wave abnormality Abnormal ECG When compared with ECG of 05-Feb-2020 10:51, PR interval prolonged Confirmed by Aashrith Eves ([id number]) on 06/01/2024 9:22:55 AM     Echo 01/27/22 demonstrated   1. Left ventricular ejection fraction, by estimation, is 60 to 65%. The  left ventricle has normal function. The left ventricle has no regional  wall motion abnormalities. There is moderate left ventricular hypertrophy.  Left ventricular diastolic parameters were normal.   2. Right ventricular systolic function is normal. The right ventricular  size is normal. There is normal pulmonary artery systolic pressure.   3. Left atrial size was mildly dilated.   4. Right atrial size was mildly dilated.   5. The mitral valve is normal in structure. Trivial mitral valve  regurgitation. No evidence of mitral stenosis.   6. The aortic valve is tricuspid. Aortic valve regurgitation is not  visualized. No aortic stenosis is present.   7. The inferior vena cava is normal in size with greater than 50%  respiratory variability, suggesting right atrial pressure of 3 mmHg.    CHA2DS2-VASc Score = 3  The patient's score is based upon: CHF History: 0 HTN History: 0 Diabetes History: 0 Stroke History: 2 Vascular Disease History: 0 Age Score: 1 Gender Score: 0       ASSESSMENT AND PLAN: Paroxysmal Atrial Fibrillation (ICD10:  I48.0) The patient's CHA2DS2-VASc score is 3, indicating a 3.2% annual risk of stroke.   General education about afib provided and questions answered. We also discussed his stroke risk and the risks and benefits of anticoagulation. Will stop Plavix  and start Eliquis  5 mg BID Check bmet/cbc Continue Toprol  50 mg daily  Secondary Hypercoagulable State (ICD10:  D68.69) The patient is at significant risk for stroke/thromboembolism based upon his CHA2DS2-VASc Score of 3.  Start Apixaban  (Eliquis ). Check bmet/cbc today.   VT Continue Toprol  50 mg daily Followed by Dr Inocencio   Follow up in the AF clinic in one month.     [facility] [address] [place], [place] [address] [phone] "

## 2024-06-01 NOTE — Patient Instructions (Signed)
Stop plavix   Start Eliquis 5mg twice a day 

## 2024-06-02 ENCOUNTER — Ambulatory Visit (HOSPITAL_COMMUNITY): Payer: Self-pay | Admitting: Physician Assistant

## 2024-06-02 ENCOUNTER — Ambulatory Visit (HOSPITAL_BASED_OUTPATIENT_CLINIC_OR_DEPARTMENT_OTHER)

## 2024-06-02 LAB — BASIC METABOLIC PANEL WITH GFR
BUN/Creatinine Ratio: 20 (ref 10–24)
BUN: 20 mg/dL (ref 8–27)
CO2: 26 mmol/L (ref 20–29)
Calcium: 10 mg/dL (ref 8.6–10.2)
Chloride: 102 mmol/L (ref 96–106)
Creatinine, Ser: 1 mg/dL (ref 0.76–1.27)
Glucose: 77 mg/dL (ref 70–99)
Potassium: 4.4 mmol/L (ref 3.5–5.2)
Sodium: 142 mmol/L (ref 134–144)
eGFR: 82 mL/min/1.73

## 2024-06-02 LAB — CBC
Hematocrit: 47.7 % (ref 37.5–51.0)
Hemoglobin: 15.7 g/dL (ref 13.0–17.7)
MCH: 30.7 pg (ref 26.6–33.0)
MCHC: 32.9 g/dL (ref 31.5–35.7)
MCV: 93 fL (ref 79–97)
Platelets: 219 x10E3/uL (ref 150–450)
RBC: 5.11 x10E6/uL (ref 4.14–5.80)
RDW: 12.6 % (ref 11.6–15.4)
WBC: 7.8 x10E3/uL (ref 3.4–10.8)

## 2024-06-04 ENCOUNTER — Ambulatory Visit: Payer: Self-pay | Admitting: Neurology

## 2024-06-30 ENCOUNTER — Ambulatory Visit (HOSPITAL_COMMUNITY): Admitting: Physician Assistant

## 2024-08-15 ENCOUNTER — Ambulatory Visit: Admitting: Family Medicine

## 2025-02-16 ENCOUNTER — Encounter: Admitting: Family Medicine

## 2025-05-17 ENCOUNTER — Ambulatory Visit

## 2025-05-30 ENCOUNTER — Ambulatory Visit: Admitting: Neurology
